# Patient Record
Sex: Female | Born: 1985 | Race: White | Hispanic: No | Marital: Married | Smoking: Never smoker
Health system: Southern US, Community
[De-identification: ages and names within clinical notes are randomized; demographics above are authoritative.]

## PROBLEM LIST (undated history)

## (undated) ENCOUNTER — Inpatient Hospital Stay (HOSPITAL_COMMUNITY): Payer: Self-pay

## (undated) DIAGNOSIS — D6851 Activated protein C resistance: Secondary | ICD-10-CM

## (undated) DIAGNOSIS — O2230 Deep phlebothrombosis in pregnancy, unspecified trimester: Secondary | ICD-10-CM

## (undated) DIAGNOSIS — R51 Headache: Secondary | ICD-10-CM

## (undated) DIAGNOSIS — R112 Nausea with vomiting, unspecified: Secondary | ICD-10-CM

## (undated) DIAGNOSIS — D759 Disease of blood and blood-forming organs, unspecified: Secondary | ICD-10-CM

## (undated) DIAGNOSIS — R87619 Unspecified abnormal cytological findings in specimens from cervix uteri: Secondary | ICD-10-CM

## (undated) DIAGNOSIS — E282 Polycystic ovarian syndrome: Secondary | ICD-10-CM

## (undated) DIAGNOSIS — I2699 Other pulmonary embolism without acute cor pulmonale: Secondary | ICD-10-CM

## (undated) DIAGNOSIS — I8390 Asymptomatic varicose veins of unspecified lower extremity: Secondary | ICD-10-CM

## (undated) DIAGNOSIS — B999 Unspecified infectious disease: Secondary | ICD-10-CM

## (undated) DIAGNOSIS — Z9889 Other specified postprocedural states: Secondary | ICD-10-CM

## (undated) HISTORY — DX: Asymptomatic varicose veins of unspecified lower extremity: I83.90

## (undated) HISTORY — PX: OTHER SURGICAL HISTORY: SHX169

## (undated) HISTORY — DX: Unspecified abnormal cytological findings in specimens from cervix uteri: R87.619

---
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
## 2006-09-28 ENCOUNTER — Encounter: Admission: RE | Admit: 2006-09-28 | Discharge: 2006-09-28 | Payer: Self-pay | Admitting: Obstetrics and Gynecology

## 2007-08-26 ENCOUNTER — Ambulatory Visit (HOSPITAL_COMMUNITY): Admission: RE | Admit: 2007-08-26 | Discharge: 2007-08-26 | Payer: Self-pay | Admitting: Emergency Medicine

## 2007-10-19 ENCOUNTER — Emergency Department (HOSPITAL_COMMUNITY): Admission: EM | Admit: 2007-10-19 | Discharge: 2007-10-19 | Payer: Self-pay | Admitting: Family Medicine

## 2008-01-10 ENCOUNTER — Emergency Department (HOSPITAL_COMMUNITY): Admission: EM | Admit: 2008-01-10 | Discharge: 2008-01-10 | Payer: Self-pay | Admitting: Emergency Medicine

## 2008-03-01 ENCOUNTER — Emergency Department (HOSPITAL_COMMUNITY): Admission: EM | Admit: 2008-03-01 | Discharge: 2008-03-01 | Payer: Self-pay | Admitting: Family Medicine

## 2010-01-28 ENCOUNTER — Emergency Department (HOSPITAL_COMMUNITY): Admission: EM | Admit: 2010-01-28 | Discharge: 2010-01-28 | Payer: Self-pay | Admitting: Emergency Medicine

## 2010-01-28 ENCOUNTER — Emergency Department (HOSPITAL_COMMUNITY): Admission: EM | Admit: 2010-01-28 | Discharge: 2010-01-28 | Payer: Self-pay | Admitting: Family Medicine

## 2010-10-23 LAB — POCT URINALYSIS DIP (DEVICE)
Bilirubin Urine: NEGATIVE
Protein, ur: NEGATIVE mg/dL
pH: 7 (ref 5.0–8.0)

## 2010-10-23 LAB — COMPREHENSIVE METABOLIC PANEL
ALT: 20 U/L (ref 0–35)
AST: 18 U/L (ref 0–37)
Albumin: 3.9 g/dL (ref 3.5–5.2)
Alkaline Phosphatase: 101 U/L (ref 39–117)
BUN: 4 mg/dL — ABNORMAL LOW (ref 6–23)
Potassium: 3.9 mEq/L (ref 3.5–5.1)
Total Bilirubin: 0.8 mg/dL (ref 0.3–1.2)

## 2010-10-23 LAB — DIFFERENTIAL
Eosinophils Absolute: 0.1 10*3/uL (ref 0.0–0.7)
Eosinophils Relative: 1 % (ref 0–5)
Lymphocytes Relative: 29 % (ref 12–46)
Lymphs Abs: 1.3 10*3/uL (ref 0.7–4.0)
Monocytes Absolute: 0.6 10*3/uL (ref 0.1–1.0)
Neutro Abs: 2.6 10*3/uL (ref 1.7–7.7)
Neutrophils Relative %: 56 % (ref 43–77)

## 2010-10-23 LAB — POCT PREGNANCY, URINE: Preg Test, Ur: NEGATIVE

## 2010-10-23 LAB — CBC
MCH: 27 pg (ref 26.0–34.0)
WBC: 4.7 10*3/uL (ref 4.0–10.5)

## 2010-10-23 LAB — GC/CHLAMYDIA PROBE AMP, GENITAL: Chlamydia, DNA Probe: NEGATIVE

## 2010-10-23 LAB — WET PREP, GENITAL
Clue Cells Wet Prep HPF POC: NONE SEEN
Trich, Wet Prep: NONE SEEN
Yeast Wet Prep HPF POC: NONE SEEN

## 2011-05-01 LAB — POCT URINALYSIS DIP (DEVICE)
Bilirubin Urine: NEGATIVE
Protein, ur: NEGATIVE
Urobilinogen, UA: 0.2
pH: 7

## 2011-05-04 LAB — POCT URINALYSIS DIP (DEVICE)
Glucose, UA: NEGATIVE
Nitrite: POSITIVE — AB
Operator id: 247071
Specific Gravity, Urine: 1.02

## 2011-05-04 LAB — URINE CULTURE

## 2011-05-04 LAB — POCT PREGNANCY, URINE
Operator id: 247071
Preg Test, Ur: NEGATIVE

## 2011-05-05 LAB — POCT URINALYSIS DIP (DEVICE)
Ketones, ur: NEGATIVE
Protein, ur: NEGATIVE
Urobilinogen, UA: 0.2
pH: 5.5

## 2011-05-05 LAB — POCT PREGNANCY, URINE
Operator id: 282151
Preg Test, Ur: NEGATIVE

## 2011-06-08 DIAGNOSIS — I2699 Other pulmonary embolism without acute cor pulmonale: Secondary | ICD-10-CM

## 2011-06-08 HISTORY — DX: Other pulmonary embolism without acute cor pulmonale: I26.99

## 2011-11-06 ENCOUNTER — Other Ambulatory Visit: Payer: Self-pay

## 2012-04-23 ENCOUNTER — Encounter (HOSPITAL_COMMUNITY): Payer: Self-pay

## 2012-04-26 ENCOUNTER — Other Ambulatory Visit (HOSPITAL_COMMUNITY): Payer: Self-pay | Admitting: Obstetrics and Gynecology

## 2012-04-26 ENCOUNTER — Ambulatory Visit (HOSPITAL_COMMUNITY)
Admission: RE | Admit: 2012-04-26 | Discharge: 2012-04-26 | Disposition: A | Discharge status: Home / Self Care | Payer: BC Managed Care – PPO | Source: Ambulatory Visit | Attending: Gynecology | Admitting: Gynecology

## 2012-04-26 ENCOUNTER — Encounter (HOSPITAL_COMMUNITY): Payer: Self-pay

## 2012-04-26 DIAGNOSIS — D6851 Activated protein C resistance: Secondary | ICD-10-CM

## 2012-04-26 DIAGNOSIS — N970 Female infertility associated with anovulation: Secondary | ICD-10-CM

## 2012-04-26 DIAGNOSIS — E282 Polycystic ovarian syndrome: Secondary | ICD-10-CM

## 2012-04-26 DIAGNOSIS — D6859 Other primary thrombophilia: Secondary | ICD-10-CM

## 2012-04-26 HISTORY — DX: Polycystic ovarian syndrome: E28.2

## 2012-04-26 HISTORY — DX: Other pulmonary embolism without acute cor pulmonale: I26.99

## 2012-04-26 MED ORDER — FOLIC ACID 1 MG PO TABS: 4.0000 mg | ORAL_TABLET | Freq: Every day | ORAL | Status: DC

## 2012-04-26 MED ORDER — COMPLETENATE 29-1 MG PO CHEW: 1.0000 | CHEWABLE_TABLET | Freq: Every day | ORAL | Status: DC

## 2012-04-26 NOTE — Progress Notes (Signed)
MFM Consultation  Reason for Consult:  Preconception counseling regarding maternal history of VTE Date of service:  04/26/2012 Requesting Physician:  Janifer Adie  Present History: Mrs. Robin Key is a 26 yo G0 with a history of a pulmonary embolism while on birth control pills in November of 2012.  She has been tested for heritable causes of thrombophilia and noted to have Factor V Leiden Heterozygosity and a borderline low protein C deficiency.  She reports having unprotected intercourse several times weekly with her husband over the past 5 years without conception.  She has been seen by [facility] for fertility evaluation and management, reporting that she has had Clomid and injectable ovulation induction agents.  IUI, IVF, and ICSI have not been desired by her to this point.  She additionally reports that her husband has oligospermia.  She presents for MFM consultation regarding my recommendations for management of a successfully conceived pregnancy.   Past Medical History  Diagnosis Date  . Pulmonary embolism Nov 2012  . PCOS (polycystic ovarian syndrome)    No past surgical history on file.  No current outpatient prescriptions on file prior to encounter.   Allergies not on file History   Social History  . Marital Status: Married    Spouse Name: N/A    Number of Children: N/A  . Years of Education: N/A   Occupational History  . Not on file.   Social History Main Topics  . Smoking status: Not on file  . Smokeless tobacco: Not on file  . Alcohol Use: Not on file  . Drug Use: Not on file  . Sexually Active: Not on file   Other Topics Concern  . Not on file   Social History Narrative  . No narrative on file   The patient has been married for 5 years and works as Lexicographer for pediatric patients.  Her husband is a Corporate investment banker and according to her, he has not been evaluated for possible varicocele.  Objective: There were no vitals filed for this  visit. Gen:  She is an overweight, Caucasian female without apparent hirsuitism More detailed physical examination was deferred as the purpose of this visit is consultative in nature, regarding the management of a prospective pregnancy.   Discussion: I talked to your patient for more than 45 minutes about her thrombosis history (pulmonary embolism in November of 2012) in context of a Factor V Leiden heterozygosity.  I explained that patients with a history of deep venous thrombosis during a prior pregnancy or while on birth control pills have an increased risk for recurrence during their pregnancies, generally estimated to be in the range of about 5%.  On the other hand, patients with an inciting event to explain their DVT (such as a fracture or immobilization) are generally not thought to be at increased risk for recurrence during their pregnancy, so long as there are no other abnormalities or risk factors.   I explained the association between the inherited thrombophilias and risk of thrombosis, especially during pregnancy. Moreover, I told your patient that most practitioners would recommend that she take either unfractionated heparin or low molecular weight heparin prophylaxis for the duration of her pregnancy and for six weeks postpartum.  I reviewed in detail the use of prophylactic subcutaneous heparin.  I explained that prophylactic heparinization is based on empiric dosing. However, recent data supports monitoring an Anti-Xa level monthly, ensuring that the value be between 0.3 and 0.5 to achieve adequate thrombophylaxis with adjustments  for values outside of this range.  A number of dosing protocols have been used, and I suggested that she use Lovenox 40 units subq twice daily, for the duration of her pregnancy and 6 weeks of the postpartum period.  As you well know, LMWH has the advantages of decreased need for coagulation monitoring, decreased risk of bone loss, and lower risk of  thrombocytopenia, these advantages are less impressive for patients on prophylactic anticoagulation.   At term, she should have her LMWH withheld for at least 24 hours before delivery.  This can usually be accomplished by simply having a patient hold her dose if she has any symptoms suggestive of early labor.  If labor does not follow, she can simply re-start her LMWH, continuing until she again senses regular contractions.  Alternatively, a scheduled induction of labor could be considered after 39 weeks holding the dose 24 hours prior to admission for either ripening or induction.  Of course, I would recommend sequential compression devices during the induction and certainly while immobilized should she elect to have placement of an epidural.  I also spoke to her about initiating prenatal vitamins and supplemental folic acid for the prevention of neural tube defects.  Given her Caucasian background and history of infertility, I recommended to her taking both a Prenatal vitamin and 4 mg of folic acid daily for ideally 90 days prior to conception.  I advised her to consider referral to an infertility specialist as ART may be her best option to achieve a successful pregnancy.  At this point, she did not request a referral to REI and will await a discussion with you regarding your recommendations.  Summary of Recommendations:  1. Consideration for referral to an infertility specialist.  The group at [facility] would be happy to entertain a referral should you request one from their office. 2. Initiate prenatal vitamin and folic acid 4mg  oral tablets daily.  As a courtesy, these were ordered for your patient prior to her departure today and sent to her [employer]. 3.  Upon achievement of a positive pregnancy test, I would recommend an early dating scan at around 6 weeks to confirm viability. 4.  I recommend initiating LMWH (Lovenox) 40mg  subcutaneously every 12 hours upon confirmation of a  viable pregnancy in the first trimester (ie, to follow the dating scan). 5.  I would check Anti-Xa levels monthly, beginning 4 hours after the 4th dose.  I would adjust dosing to achieve adequate thromboprophylaxis of 0.3-0.5 for the Anti-Xa. 6.  Of course, I recommend First Trimester Screening at around 11-[redacted] weeks gestational age, second trimester AFP at 15-20 weeks, and anatomic survey at around 27-[redacted] weeks gestational age. 7.  Due to maternal body habitus and history of PCOS, I would recommend screening for GDM with a 1 hour glucola early at around 15-20 weeks and if negative, repeating this screen at around 24-[redacted] weeks gestational age. 8.  Should you desire involvement and management of pregnancy by a Maternal-Fetal Medicine specialist, our practice would be happy to co-manage or manage prenatal visits as you so desire. 9.  If not previously done, I would recommend screening with an antiphospholipid antibody syndrome (APS) panel (anticardiolipin, lupus anticoagulant, and anti-beta2 glycoprotein I) and would consider repeating the Protein C levels that were "previously borderline" per verbal report of the patient.   Thank you for consultation.  It was a pleasure seeing your patient in the office today.  I spent 45 minutes in face-to-face time counseling your  patient today with more than 50% of this time in education and counseling.   Rogelia Boga, MD, MS, FACOG Assistant Professor Section of Maternal-Fetal Medicine [facility]

## 2012-11-27 ENCOUNTER — Inpatient Hospital Stay (HOSPITAL_COMMUNITY): Payer: BC Managed Care – PPO

## 2012-11-27 ENCOUNTER — Inpatient Hospital Stay (HOSPITAL_COMMUNITY)
Admission: AD | Admit: 2012-11-27 | Discharge: 2012-11-27 | Disposition: A | Discharge status: Home / Self Care | Payer: BC Managed Care – PPO | Source: Ambulatory Visit | Attending: Obstetrics and Gynecology | Admitting: Obstetrics and Gynecology

## 2012-11-27 ENCOUNTER — Encounter (HOSPITAL_COMMUNITY): Payer: Self-pay | Admitting: *Deleted

## 2012-11-27 DIAGNOSIS — R109 Unspecified abdominal pain: Secondary | ICD-10-CM | POA: Insufficient documentation

## 2012-11-27 DIAGNOSIS — O209 Hemorrhage in early pregnancy, unspecified: Secondary | ICD-10-CM | POA: Insufficient documentation

## 2012-11-27 DIAGNOSIS — O368990 Maternal care for other specified fetal problems, unspecified trimester, not applicable or unspecified: Secondary | ICD-10-CM

## 2012-11-27 DIAGNOSIS — O418X10 Other specified disorders of amniotic fluid and membranes, first trimester, not applicable or unspecified: Secondary | ICD-10-CM

## 2012-11-27 NOTE — MAU Note (Signed)
PT SAYS  YESTERDAY - SHE HAD BROWN SPOTTING.Marland Kitchen  HAS BEEN HAVING CRAMPING - BRURNING IN LOWER ABD  SINCE YESTERDAY.  CALLED  DR MEZER'S OFFICE - TOLD TO COME HER.   SHE GOES TO DR Chevis Pretty  FOR INFERTILITY.-  DID NOT  EVER TAKE INFERTILITY  MEDS.      - HAS NOT CHOSEN AN OB DR.

## 2012-11-27 NOTE — MAU Provider Note (Signed)
History     CSN: [id number]  Arrival date and time: 11/27/12 0418   None     No chief complaint on file.  HPI This is a 27 y.o. female at [redacted] weeks gestation who presents with c/o abdominal pain from umbilicus to pelvis (sharp shooting) today and also brown discharge. Has been seeing Dr Chevis Pretty for early pregnancy.  Has had good doubling of HCG levels and one US showed FHR 130.  States he saw "2 ovarian cysts" and "fluid behind the uterus" that "he was afraid was bleeding and he might have to fix it surgically".  States answering service told her to come here. Is on Lovenox for hx pulmonary embolism on OCPs.  OB History   Grav Para Term Preterm Abortions TAB SAB Ect Mult Living   1 0 0 0 0 0 0 0 0 0       Past Medical History  Diagnosis Date  . Pulmonary embolism Nov 2012  . PCOS (polycystic ovarian syndrome)     Past Surgical History  Procedure Laterality Date  . Bladder stem stretched      History reviewed. No pertinent family history.  History  Substance Use Topics  . Smoking status: Never Smoker   . Smokeless tobacco: Not on file  . Alcohol Use: No    Allergies:  Allergies  Allergen Reactions  . Bactrim (Sulfamethoxazole W-Trimethoprim) Other (See Comments)    Headache   . Ciprofloxacin Other (See Comments)    Felt like "bugs crawling"  . Latex Rash  . Penicillins Rash    Prescriptions prior to admission  Medication Sig Dispense Refill  . folic acid (FOLVITE) 1 MG tablet Take 4 tablets (4 mg total) by mouth daily.  100 tablet  10  . prenatal vitamin w/FE, FA (NATACHEW) 29-1 MG CHEW Chew 1 tablet by mouth daily.  30 tablet  10    Review of Systems  Constitutional: Negative for fever, chills and malaise/fatigue.  Gastrointestinal: Positive for nausea and abdominal pain. Negative for vomiting, diarrhea and constipation.  Genitourinary: Negative for dysuria.  Neurological: Negative for dizziness, weakness and headaches.  Psychiatric/Behavioral: The patient  is nervous/anxious.    Physical Exam   Blood pressure 116/75, pulse 90, temperature 98.1 F (36.7 C), temperature source Oral, resp. rate 18, height 5\' 7"  (1.702 m), weight 226 lb (102.513 kg).  Physical Exam  Constitutional: She is oriented to person, place, and time. She appears well-developed and well-nourished. No distress.  Cardiovascular: Normal rate.   Respiratory: Effort normal.  GI: Soft. She exhibits no distension and no mass. There is no tenderness. There is no rebound and no guarding.  Light brown discharge (small amt) in vault Cervix long and closed   Genitourinary: Uterus normal. Vaginal discharge found.  Musculoskeletal: Normal range of motion.  Neurological: She is alert and oriented to person, place, and time.  Skin: Skin is warm and dry.  Psychiatric: She has a normal mood and affect.    MAU Course  Procedures  MDM Will check and [place] to make sure there is no internal bleeding  US Ob Transvaginal  11/27/2012  *RADIOLOGY REPORT*  Clinical Data: Spotting, pregnant.  OBSTETRIC <14 WK [place] AND TRANSVAGINAL OB US  Technique:  Both transabdominal and transvaginal ultrasound examinations were performed for complete evaluation of the gestation as well as the maternal uterus, adnexal regions, and pelvic cul-de-sac.  Transvaginal technique was performed to assess early pregnancy.  Comparison:  None.  Intrauterine gestational sac:  Visualized/normal in shape. Yolk  sac: Identified Embryo: Identified Cardiac Activity: Identified Heart Rate: 146 bpm  CRL: 10  mm  7 w  1 d         [place] EDC: 07/15/2013  Maternal uterus/adnexae: Small subchorionic hemorrhage.  Normal sonographic appearance to the ovaries.  Trace free fluid.   MPRESSION: Single intrauterine gestation with cardiac activity documented. Estimated age of 7 weeks 1 day by crown-rump length.  Small subchorionic hemorrhage.   Original Report Authenticated By: Jearld Lesch, M.D.     Assessment and Plan  A:  [facility] at [facility]  P:  Discharge home      Bleeding precautions      Followup with Dr Rema Fendt 11/27/2012, 5:15 AM

## 2012-12-10 LAB — OB RESULTS CONSOLE ABO/RH: RH Type: POSITIVE

## 2012-12-10 LAB — OB RESULTS CONSOLE ANTIBODY SCREEN: Antibody Screen: NEGATIVE

## 2012-12-10 LAB — OB RESULTS CONSOLE RUBELLA ANTIBODY, IGM: Rubella: IMMUNE

## 2012-12-10 LAB — OB RESULTS CONSOLE HEPATITIS B SURFACE ANTIGEN: Hepatitis B Surface Ag: NEGATIVE

## 2012-12-24 ENCOUNTER — Telehealth: Payer: Self-pay | Admitting: Oncology

## 2012-12-24 NOTE — Telephone Encounter (Signed)
S/W PT IN RE NP APPT 06/20 @ 10:30 W/DR. SHADAD REFERRING DR. Fayrene Fearing TOMBLIN DX- PREGNANT; [facility] [employer] WELCOME PACKET MAILED.

## 2012-12-24 NOTE — Telephone Encounter (Signed)
C/D 12/24/12 for appt. 01/24/13

## 2013-01-16 ENCOUNTER — Encounter (HOSPITAL_COMMUNITY): Payer: Self-pay | Admitting: *Deleted

## 2013-01-16 ENCOUNTER — Inpatient Hospital Stay (HOSPITAL_COMMUNITY)
Admission: AD | Admit: 2013-01-16 | Discharge: 2013-01-16 | Disposition: A | Discharge status: Home / Self Care | Payer: BC Managed Care – PPO | Source: Ambulatory Visit | Attending: Obstetrics and Gynecology | Admitting: Obstetrics and Gynecology

## 2013-01-16 DIAGNOSIS — O210 Mild hyperemesis gravidarum: Secondary | ICD-10-CM | POA: Insufficient documentation

## 2013-01-16 DIAGNOSIS — O219 Vomiting of pregnancy, unspecified: Secondary | ICD-10-CM

## 2013-01-16 HISTORY — DX: Headache: R51

## 2013-01-16 HISTORY — DX: Other specified postprocedural states: Z98.890

## 2013-01-16 HISTORY — DX: Other specified postprocedural states: R11.2

## 2013-01-16 HISTORY — DX: Activated protein C resistance: D68.51

## 2013-01-16 HISTORY — DX: Unspecified infectious disease: B99.9

## 2013-01-16 LAB — URINALYSIS, ROUTINE W REFLEX MICROSCOPIC
Glucose, UA: NEGATIVE mg/dL
Leukocytes, UA: NEGATIVE
Nitrite: NEGATIVE
pH: 7 (ref 5.0–8.0)

## 2013-01-16 MED ORDER — ONDANSETRON HCL 4 MG/2ML IJ SOLN
4.0000 mg | Freq: Once | INTRAMUSCULAR | Status: AC
  Administered 2013-01-16: 4 mg via INTRAVENOUS
  Filled 2013-01-16: qty 2

## 2013-01-16 MED ORDER — DOXYLAMINE-PYRIDOXINE 10-10 MG PO TBEC: 20.0000 mg | DELAYED_RELEASE_TABLET | Freq: Every day | ORAL | Status: DC

## 2013-01-16 MED ORDER — LACTATED RINGERS IV BOLUS (SEPSIS)
1000.0000 mL | Freq: Once | INTRAVENOUS | Status: AC
  Administered 2013-01-16: 1000 mL via INTRAVENOUS

## 2013-01-16 NOTE — MAU Note (Signed)
Called office, sent in for fluids.  Peed once today, did not pee at all last night.

## 2013-01-16 NOTE — MAU Provider Note (Signed)
History     CSN: [id number]  Arrival date and time: 01/16/13 1329   First Provider Initiated Contact with Patient 01/16/13 1400      Chief Complaint  Patient presents with  . Dehydration  . Headache   HPI [name] is a 27 y.o. G1P0000 at [redacted]w[redacted]d who presents to MAU today with complaint of N/V, dehydration and headache. The patient states that she has had frequent N/V throughout the pregnancy. She has been on Zofran and Diclegis and feels neither is working anymore. She states mild lower abdominal cramping and decreased urine output since last night. She denies vaginal bleeding, abnormal discharge, LOF, fever or UTI symptoms. She also states that she has had increased fatigue recently.   OB History   Grav Para Term Preterm Abortions TAB SAB Ect Mult Living   1 0 0 0 0 0 0 0 0 0       Past Medical History  Diagnosis Date  . Pulmonary embolism Nov 2012  . PCOS (polycystic ovarian syndrome)   . PONV (postoperative nausea and vomiting)   . Headache(784.0)   . Factor V Leiden   . Infection     UTI    Past Surgical History  Procedure Laterality Date  . Bladder stem stretched      Family History  Problem Relation Age of Onset  . Cancer Mother     skin  . Heart disease Father   . Miscarriages / Stillbirths Sister   . Diabetes Maternal Grandmother   . Cancer Maternal Grandmother     breast  . Osteoporosis Maternal Grandmother   . Glaucoma Maternal Grandmother   . Vision loss Maternal Grandmother   . Heart disease Maternal Grandfather   . Hypertension Maternal Grandfather   . Stroke Maternal Grandfather   . Dementia Paternal Grandmother   . Heart disease Paternal Grandfather     History  Substance Use Topics  . Smoking status: Never Smoker   . Smokeless tobacco: Never Used  . Alcohol Use: No    Allergies:  Allergies  Allergen Reactions  . Bactrim (Sulfamethoxazole W-Trimethoprim) Other (See Comments)    Headache   . Ciprofloxacin Other (See  Comments)    Felt like "bugs crawling"  . Latex Rash  . Penicillins Rash    Prescriptions prior to admission  Medication Sig Dispense Refill  . acetaminophen (TYLENOL) 500 MG tablet Take 1,000 mg by mouth every 6 (six) hours as needed for pain.      . butalbital-acetaminophen-caffeine (FIORICET, ESGIC) 50-325-40 MG per tablet Take 1 tablet by mouth 2 (two) times daily as needed for headache.      . enoxaparin (LOVENOX) 150 MG/ML injection Inject 150 mg into the skin daily.      . Ondansetron (ZOFRAN ODT PO) Take 1 tablet by mouth every 8 (eight) hours as needed (nausea).      . prenatal vitamin w/FE, FA (NATACHEW) 29-1 MG CHEW Chew 1 tablet by mouth daily.  30 tablet  10    Review of Systems  Constitutional: Positive for malaise/fatigue. Negative for fever.  Gastrointestinal: Positive for nausea, vomiting and abdominal pain. Negative for diarrhea and constipation.  Genitourinary: Negative for dysuria, urgency and frequency.       Neg - vaginal bleeding, discharge, LOF  Neurological: Positive for weakness and headaches. Negative for loss of consciousness.   Physical Exam   Blood pressure 114/72, pulse 112, temperature 99.2 F (37.3 C), temperature source Oral, resp. rate 20, height 5\' 6"  (1.676  m), weight 224 lb (101.606 kg).  Physical Exam  Constitutional: She is oriented to person, place, and time. She appears well-developed and well-nourished. No distress.  HENT:  Head: Normocephalic and atraumatic.  Cardiovascular: Normal rate.   Respiratory: Effort normal.  GI: Soft.  Neurological: She is alert and oriented to person, place, and time.  Skin: Skin is warm and dry. No erythema.  Psychiatric: She has a normal mood and affect.   Results for orders placed during the hospital encounter of 01/16/13 (from the past 24 hour(s))  URINALYSIS, ROUTINE W REFLEX MICROSCOPIC     Status: Abnormal   Collection Time    01/16/13  2:00 PM      Result Value Range   Color, Urine YELLOW   YELLOW   APPearance CLEAR  CLEAR   Specific Gravity, Urine 1.025  1.005 - 1.030   pH 7.0  5.0 - 8.0   Glucose, UA NEGATIVE  NEGATIVE mg/dL   Hgb urine dipstick NEGATIVE  NEGATIVE   Bilirubin Urine NEGATIVE  NEGATIVE   Ketones, ur 15 (*) NEGATIVE mg/dL   Protein, ur NEGATIVE  NEGATIVE mg/dL   Urobilinogen, UA 0.2  0.0 - 1.0 mg/dL   Nitrite NEGATIVE  NEGATIVE   Leukocytes, UA NEGATIVE  NEGATIVE    MAU Course  Procedures None  MDM Discussed with Dr. Renaldo Fiddler. 1 L LR bolus with Zofran or Phenergan. PO challenge.  Updated Dr. Renaldo Fiddler. Ok for discharge with Rx for Diclegis 2 tabs qhs and add 1 tab qam if symptoms persists. Prescribe #90.   Assessment and Plan  A: Nausea and vomiting in pregnancy prior to [redacted] weeks gestation  P: Discharge home Rx for Diclegis sent to patient's pharmacy Patient encouraged to increase PO hydration as tolerated Patient encouraged to keep scheduled follow-up with [facility] as scheduled for next week or call for sooner if symptoms persist or worsen Patient may return to MAU as needed  Freddi Starr, PA-C  01/16/2013, 4:29 PM

## 2013-01-24 ENCOUNTER — Ambulatory Visit (HOSPITAL_BASED_OUTPATIENT_CLINIC_OR_DEPARTMENT_OTHER): Payer: BC Managed Care – PPO | Admitting: Oncology

## 2013-01-24 ENCOUNTER — Other Ambulatory Visit: Payer: BC Managed Care – PPO | Admitting: Lab

## 2013-01-24 ENCOUNTER — Ambulatory Visit: Payer: BC Managed Care – PPO

## 2013-01-24 VITALS — BP 109/68 | HR 76 | Temp 98.4°F | Resp 18 | Ht 66.0 in | Wt 225.5 lb

## 2013-01-24 DIAGNOSIS — Z86718 Personal history of other venous thrombosis and embolism: Secondary | ICD-10-CM

## 2013-01-24 NOTE — Progress Notes (Signed)
Reason for Referral: Thrombosis and pregnancy.   HPI: Robin Key is a pleasant 27 year old woman currently living in a [place] where she lived the majority of her life. 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 She is a rather healthy woman who was in her usual state of health where she presented in November 2012 with symptoms of chest pain and shortness of breath. She was evaluated in the emergency department and found to have a right sided pulmonary embolus. She was initially treated with Lovenox and subsequently transition to warfarin over her INR levels were fluctuating and was switched Xarelto  about 6 months. She was checked for inherited thrombophilia and was found to have a heterozygous factor V Leiden mutation. During that the first episode of pulmonary embolus she was on birth control pills which since have discontinued. Most recently she was found to be pregnant and currently she is close to 16 weeks of gestation. When when her pregnancy was established, she was started on low molecular weight heparin in the form of Lovenox dural 150 mg subcutaneously daily. She was evaluated by Dr. Melvyn Neth at [facility]. Patient had been doing well with Lovenox without any major complications although intermittently he have reported some shoulder pain and some minor chest discomfort. She was not able to be evaluated for recurrent pulmonary embolus due to her pregnancy state and no imaging studies were done. She was referred to me for the evaluation and recommendation regarding her treatment of pulmonary embolus in the setting of pregnancy and factor V Leiden.  Clinically, he is a relatively well with her pregnancy. She is not reporting any bleeding complications. She is not reporting any thrombosis. She had the superficial vein bulging but no phlebitis. She does have intermittent shoulder pain but really no shortness of  breath or pleurisy at this time. She had not reported any neurological symptoms but reports some minor nausea associated with her pregnancy.   Past Medical History  Diagnosis Date  . Pulmonary embolism Nov 2012  . PCOS (polycystic ovarian syndrome)   . PONV (postoperative nausea and vomiting)   . Headache(784.0)   . Factor V Leiden   . Infection     UTI  :   Current Outpatient Prescriptions  Medication Sig Dispense Refill  . acetaminophen (TYLENOL) 500 MG tablet Take 1,000 mg by mouth every 6 (six) hours as needed for pain.      . butalbital-acetaminophen-caffeine (FIORICET, ESGIC) 50-325-40 MG per tablet Take 1 tablet by mouth 2 (two) times daily as needed for headache.      . Doxylamine-Pyridoxine (DICLEGIS) 10-10 MG TBEC Take 20 mg by mouth at bedtime.  90 tablet  0  . enoxaparin (LOVENOX) 150 MG/ML injection Inject 150 mg into the skin daily.      . Ondansetron (ZOFRAN ODT PO) Take 1 tablet by mouth every 8 (eight) hours as needed (nausea).      Marland Kitchen UNABLE TO FIND Take 1 tablet by mouth daily. Med Name: citronate beta       No current facility-administered medications for this visit.    Past Surgical History  Procedure Laterality Date  . Bladder stem stretched    :    Allergen Reactions  . Bactrim (Sulfamethoxazole W-Trimethoprim) Other (See Comments)    Headache   . Ciprofloxacin Other (See Comments)    Felt like "bugs crawling"  . Latex Rash  .  Penicillins Rash  :  Family History  Problem Relation Age of Onset  . Cancer Mother     skin  . Heart disease Father   . Miscarriages / Stillbirths Sister   . Diabetes Maternal Grandmother   . Cancer Maternal Grandmother     breast  . Osteoporosis Maternal Grandmother   . Glaucoma Maternal Grandmother   . Vision loss Maternal Grandmother   . Heart disease Maternal Grandfather   . Hypertension Maternal Grandfather   . Stroke Maternal Grandfather   . Dementia Paternal Grandmother   . Heart disease Paternal  Grandfather   :  History   Social History  . Marital Status: Married    Spouse Name: N/A    Number of Children: N/A  . Years of Education: N/A   Occupational History  . Not on file.   Social History Main Topics  . Smoking status: Never Smoker   . Smokeless tobacco: Never Used  . Alcohol Use: No  . Drug Use: No  . Sexually Active: Yes   Other Topics Concern  . Not on file   Social History Narrative  . No narrative on file  :  A comprehensive review of systems was negative.  Exam: ECOG 0 Blood pressure 109/68, pulse 76, temperature 98.4 F (36.9 C), temperature source Oral, resp. rate 18, height 5\' 6"  (1.676 m), weight 225 lb 8 oz (102.286 kg). General appearance: alert, cooperative and appears stated age Head: Normocephalic, without obvious abnormality, atraumatic Neck: no adenopathy, no carotid bruit, no JVD, supple, symmetrical, trachea midline and thyroid not enlarged, symmetric, no tenderness/mass/nodules Back: symmetric, no curvature. ROM normal. No CVA tenderness. Resp: clear to auscultation bilaterally Chest wall: no tenderness Cardio: regular rate and rhythm, S1, S2 normal, no murmur, click, rub or gallop GI: soft, non-tender; bowel sounds normal; no masses,  no organomegaly Extremities: extremities normal, atraumatic, no cyanosis or edema Skin: Skin color, texture, turgor normal. No rashes or lesions Lymph nodes: Cervical, supraclavicular, and axillary nodes normal.   Assessment and Plan:   27 year old woman with the following issues:  1. History of thrombophilia presented with pulmonary embolus in November of 2012 in the setting of heterozygous factor V Leiden mutation while on all contraceptives. She was treated with full dose and the coagulation initially with Lovenox and subsequently with Xarelto. She is currently seeing weeks of gestation and have been prescribed full dose Lovenox 150 mg daily. She reports no complications at this time from her pregnancy  or Lovenox treatment. I discussed the natural history of inherited thrombophilia and the risk of recurrent thrombosis associated with pregnancy and I have discussed with her the guidelines and recommendation regarding venous thromboembolism prophylaxis. At this time, despite her history she still have relatively low-risk of recurrent thrombosis but certainly warrants pharmacological prophylaxis at this time.  I do agree with the use of Lovenox at this time I would probably would suggest prophylaxis doses unless she is potentially developing recurrent pulmonary emboli. She did have symptoms of chest pain and shoulder pain and it was difficult to assess given her pregnancy and for that reason therapeutic doses of Lovenox appears reasonable at this time.  In a situation like this, we would recommend prophylaxis with Lovenox also at the pregnancy and 4 weeks post partum.  We will also recommend checking his CBC periodically as a part of her routine OB checks. Her most recent CBC on 12/12/2012 [facility] she had normal platelets and a hemoglobin of 12.8  In terms of pregnancy  protection, there is a risk of miscarriage is associated with inherited thrombophilia and certainly prophylaxis with Lovenox would help without and seems to have helped her with her pregnancy so far.  2. Followup: At this time routine followup here with [place] at the [facility] is not necessary but will be happy to see her in the future as needed if any complications arise

## 2013-05-12 ENCOUNTER — Encounter (HOSPITAL_COMMUNITY): Payer: Self-pay | Admitting: *Deleted

## 2013-05-12 ENCOUNTER — Inpatient Hospital Stay (HOSPITAL_COMMUNITY)
Admission: AD | Admit: 2013-05-12 | Discharge: 2013-05-13 | Disposition: A | Discharge status: Home / Self Care | Payer: BC Managed Care – PPO | Source: Ambulatory Visit | Attending: Obstetrics and Gynecology | Admitting: Obstetrics and Gynecology

## 2013-05-12 DIAGNOSIS — H53459 Other localized visual field defect, unspecified eye: Secondary | ICD-10-CM | POA: Insufficient documentation

## 2013-05-12 DIAGNOSIS — M25511 Pain in right shoulder: Secondary | ICD-10-CM

## 2013-05-12 DIAGNOSIS — O163 Unspecified maternal hypertension, third trimester: Secondary | ICD-10-CM

## 2013-05-12 DIAGNOSIS — O368190 Decreased fetal movements, unspecified trimester, not applicable or unspecified: Secondary | ICD-10-CM | POA: Insufficient documentation

## 2013-05-12 DIAGNOSIS — Z86711 Personal history of pulmonary embolism: Secondary | ICD-10-CM

## 2013-05-12 DIAGNOSIS — M25519 Pain in unspecified shoulder: Secondary | ICD-10-CM | POA: Insufficient documentation

## 2013-05-12 DIAGNOSIS — R51 Headache: Secondary | ICD-10-CM | POA: Insufficient documentation

## 2013-05-12 DIAGNOSIS — O139 Gestational [pregnancy-induced] hypertension without significant proteinuria, unspecified trimester: Secondary | ICD-10-CM | POA: Insufficient documentation

## 2013-05-12 LAB — URINALYSIS, ROUTINE W REFLEX MICROSCOPIC
Leukocytes, UA: NEGATIVE
Protein, ur: NEGATIVE mg/dL
Urobilinogen, UA: 0.2 mg/dL (ref 0.0–1.0)

## 2013-05-12 LAB — CBC
HCT: 32.5 % — ABNORMAL LOW (ref 36.0–46.0)
MCHC: 33.5 g/dL (ref 30.0–36.0)
MCV: 80.6 fL (ref 78.0–100.0)
RDW: 13.3 % (ref 11.5–15.5)

## 2013-05-12 LAB — COMPREHENSIVE METABOLIC PANEL
Albumin: 2.6 g/dL — ABNORMAL LOW (ref 3.5–5.2)
BUN: 6 mg/dL (ref 6–23)
Creatinine, Ser: 0.64 mg/dL (ref 0.50–1.10)
Potassium: 3.8 mEq/L (ref 3.5–5.1)
Total Protein: 6 g/dL (ref 6.0–8.3)

## 2013-05-12 MED ORDER — BUTALBITAL-APAP-CAFFEINE 50-325-40 MG PO TABS
2.0000 | ORAL_TABLET | Freq: Once | ORAL | Status: AC
  Administered 2013-05-13: 2 via ORAL
  Filled 2013-05-12: qty 2

## 2013-05-12 MED ORDER — ENOXAPARIN SODIUM 150 MG/ML ~~LOC~~ SOLN
150.0000 mg | Freq: Once | SUBCUTANEOUS | Status: DC
  Filled 2013-05-12: qty 1

## 2013-05-12 MED ORDER — ONDANSETRON HCL 4 MG PO TABS
4.0000 mg | ORAL_TABLET | Freq: Once | ORAL | Status: AC
  Administered 2013-05-12: 4 mg via ORAL
  Filled 2013-05-12: qty 1

## 2013-05-12 NOTE — MAU Note (Signed)
PT SAYS ON Sunday-  SHE SAW SPOTS.   THEN TODAY- SHE WOKE WITH H/A.- TOOK 650 MG TYLENOL - NO RELIEF.     TODAY CAME HOME FROM WORK- SHE FELT  NAUSEATED AND RIGHT SHOULDER WAS HURTING.   HAS HX- PE.    DENIES SOB.      BABY  IS NOT AS ACTIVE.  SAYS TOOK BP AT 9PM-  148/90- WITH CUFF AT HOME - MANUAL.

## 2013-05-12 NOTE — MAU Provider Note (Signed)
Chief Complaint: No chief complaint on file.   First Provider Initiated Contact with Patient 05/12/13 2317     SUBJECTIVE HPI: Robin Key is a 27 y.o. G1P0000 at [redacted]w[redacted]d by LMP who presents with: 1. Blood pressure 148/90 at 9 PM. 2. Scotoma yesterday. 3. Frontal headache all day today. No relief with Tylenol. 4. Right shoulder pain similar to when she was diagnosed with a pulmonary embolism. Is currently on Lovenox 150 mg subcutaneous daily for prophylaxis. 5. Decreased fetal movement. Baby active since arrival to maternity admissions.  History of headaches prior to pregnancy. Few during pregnancy. Normal blood pressure so far this pregnancy.  Past Medical History  Diagnosis Date  . Pulmonary embolism Nov 2012  . PCOS (polycystic ovarian syndrome)   . PONV (postoperative nausea and vomiting)   . Headache(784.0)   . Factor V Leiden   . Infection     UTI   OB History  Gravida Para Term Preterm AB SAB TAB Ectopic Multiple Living  1 0 0 0 0 0 0 0 0 0     # Outcome Date GA Lbr Len/2nd Weight Sex Delivery Anes PTL Lv  1 CUR              Past Surgical History  Procedure Laterality Date  . Bladder stem stretched     History   Social History  . Marital Status: Married    Spouse Name: N/A    Number of Children: N/A  . Years of Education: N/A   Occupational History  . Not on file.   Social History Main Topics  . Smoking status: Never Smoker   . Smokeless tobacco: Never Used  . Alcohol Use: No  . Drug Use: No  . Sexual Activity: Not Currently   Other Topics Concern  . Not on file   Social History Narrative  . No narrative on file   No current facility-administered medications on file prior to encounter.   Current Outpatient Prescriptions on File Prior to Encounter  Medication Sig Dispense Refill  . acetaminophen (TYLENOL) 500 MG tablet Take 1,000 mg by mouth every 6 (six) hours as needed for pain.      . butalbital-acetaminophen-caffeine (FIORICET, ESGIC)  50-325-40 MG per tablet Take 1 tablet by mouth 2 (two) times daily as needed for headache.      . Doxylamine-Pyridoxine (DICLEGIS) 10-10 MG TBEC Take 20 mg by mouth at bedtime.  90 tablet  0  . enoxaparin (LOVENOX) 150 MG/ML injection Inject 150 mg into the skin daily.      . Ondansetron (ZOFRAN ODT PO) Take 1 tablet by mouth every 8 (eight) hours as needed (nausea).      Marland Kitchen UNABLE TO FIND Take 1 tablet by mouth daily. Med Name: citronate beta       Allergies  Allergen Reactions  . Bactrim [Sulfamethoxazole-Trimethoprim] Other (See Comments)    Headache   . Ciprofloxacin Other (See Comments)    Felt like "bugs crawling"  . Latex Rash  . Penicillins Rash    ROS: Also positive for nausea and bilateral pedal edema, left slightly greater than right. Negative for fever, chills, current scotoma, epigastric pain, shortness of breath, calf pain or redness, contractions, vaginal bleeding or leaking of fluid.  OBJECTIVE Blood pressure 119/78, pulse 94, temperature 98.7 F (37.1 C), temperature source Oral, resp. rate 16, height 5\' 7"  (1.702 m), weight 111.301 kg (245 lb 6 oz). Patient Vitals for the past 24 hrs:  BP Temp Temp src Pulse Resp  Height Weight  05/13/13 0123 119/78 mmHg - - 94 - - -  05/13/13 0003 114/79 mmHg - - 105 - - -  05/13/13 0000 117/72 mmHg - - 106 16 - -  05/12/13 2320 121/79 mmHg - - 100 18 - -  05/12/13 2249 106/76 mmHg 98.7 F (37.1 C) Oral 117 20 5\' 7"  (1.702 m) 111.301 kg (245 lb 6 oz)    GENERAL: Well-developed, well-nourished female in no acute distress.  HEENT: Normocephalic HEART: Mild tachycardia. RESP: normal effort and rate. Clear to auscultation bilaterally. ABDOMEN: Soft, non-tender EXTREMITIES: Nontender, 3+ pitting edema. negative Homans sign. NEURO: Alert and oriented. Deep tendon reflexes 2+, no clonus. SPECULUM EXAM: Deferred  EFM: 140's, moderate variability, 15x15 accels, no decels. Toco: None  LAB RESULTS Results for orders placed during  the hospital encounter of 05/12/13 (from the past 24 hour(s))  PROTEIN / CREATININE RATIO, URINE     Status: None   Collection Time    05/12/13 10:51 PM      Result Value Range   Creatinine, Urine 69.80     Total Protein, Urine 7.1     PROTEIN CREATININE RATIO 0.10  0.00 - 0.15  URINALYSIS, ROUTINE W REFLEX MICROSCOPIC     Status: None   Collection Time    05/12/13 10:51 PM      Result Value Range   Color, Urine YELLOW  YELLOW   APPearance CLEAR  CLEAR   Specific Gravity, Urine 1.015  1.005 - 1.030   pH 6.5  5.0 - 8.0   Glucose, UA NEGATIVE  NEGATIVE mg/dL   Hgb urine dipstick NEGATIVE  NEGATIVE   Bilirubin Urine NEGATIVE  NEGATIVE   Ketones, ur NEGATIVE  NEGATIVE mg/dL   Protein, ur NEGATIVE  NEGATIVE mg/dL   Urobilinogen, UA 0.2  0.0 - 1.0 mg/dL   Nitrite NEGATIVE  NEGATIVE   Leukocytes, UA NEGATIVE  NEGATIVE  CBC     Status: Abnormal   Collection Time    05/12/13 11:00 PM      Result Value Range   WBC 13.5 (*) 4.0 - 10.5 K/uL   RBC 4.03  3.87 - 5.11 MIL/uL   Hemoglobin 10.9 (*) 12.0 - 15.0 g/dL   HCT 40.9 (*) 81.1 - 91.4 %   MCV 80.6  78.0 - 100.0 fL   MCH 27.0  26.0 - 34.0 pg   MCHC 33.5  30.0 - 36.0 g/dL   RDW 78.2  95.6 - 21.3 %   Platelets 261  150 - 400 K/uL  COMPREHENSIVE METABOLIC PANEL     Status: Abnormal   Collection Time    05/12/13 11:00 PM      Result Value Range   Sodium 135  135 - 145 mEq/L   Potassium 3.8  3.5 - 5.1 mEq/L   Chloride 99  96 - 112 mEq/L   CO2 23  19 - 32 mEq/L   Glucose, Bld 102 (*) 70 - 99 mg/dL   BUN 6  6 - 23 mg/dL   Creatinine, Ser 0.86  0.50 - 1.10 mg/dL   Calcium 9.3  8.4 - 57.8 mg/dL   Total Protein 6.0  6.0 - 8.3 g/dL   Albumin 2.6 (*) 3.5 - 5.2 g/dL   AST 12  0 - 37 U/L   ALT 13  0 - 35 U/L   Alkaline Phosphatase 96  39 - 117 U/L   Total Bilirubin 0.4  0.3 - 1.2 mg/dL   GFR calc non Af Amer >90  >  90 mL/min   GFR calc Af Amer >90  >90 mL/min    IMAGING No results found.  MAU COURSE 0030: Discussed patient's  concerns about elevated blood pressure, normal labs and BP and shoulder pain similar to that with PE. Order CTPA. Patient declines. Fioricet given for headache. Worried about radiation exposure to fetus. Reassured that radiation exposure is very minimal, but that the danger of missing diagnosis of PE is very high risk to mom and baby. Still declines. Is now when her shoulder pain is musculoskeletal in request muscle relaxant. Flexeril given.   Oxygen saturation 99 200% on room air with ambulation. Discussed patient's refusal of CTPA and normal oxygen saturation with ambulation with Dr. Arelia Sneddon. Strongly recommends that she have it done and reiterated low radiation exposure to fetus. Express Dr. Gaye Alken strong recommendation for CTPA 2 patient. She again refused and stated that her shoulder pain was improving with Flexeril.  ASSESSMENT 1. Hypertension complicating pregnancy, third trimester -transient   2. Shoulder pain, acute, right   3. History of pulmonary embolism    PLAN Discharge home in stable condition. PE precautions. Strongly encouraged patient to call hematologist in the morning to discuss symptoms and if additional workup or change in Lovenox dose is warranted.      Follow-up Information   Follow up with [facility] [place]. On 05/16/2013. (as scheduled)    Contact information:   [address] [place] [address] [phone]      Follow up with [facility] [place] [facility]. (As needed if symptoms worsen)    Contact information:   [address] [id number] [place] [address] [phone]      Follow up with [facility]. (As needed for severe shortness of breath or shoulder pain)    Contact information:   [address] [place] [address]        Medication List         acetaminophen 500 MG tablet  Commonly known as:  TYLENOL  Take 1,000 mg by mouth every 6 (six) hours as needed for  pain.     butalbital-acetaminophen-caffeine 50-325-40 MG per tablet  Commonly known as:  FIORICET, ESGIC  Take 1 tablet by mouth 2 (two) times daily as needed for headache.     cyclobenzaprine 10 MG tablet  Commonly known as:  FLEXERIL  Take 0.5-1 tablets (5-10 mg total) by mouth 3 (three) times daily as needed for muscle spasms.     Doxylamine-Pyridoxine 10-10 MG Tbec  Commonly known as:  DICLEGIS  Take 20 mg by mouth at bedtime.     enoxaparin 150 MG/ML injection  Commonly known as:  LOVENOX  Inject 150 mg into the skin daily.     promethazine 25 MG tablet  Commonly known as:  PHENERGAN  Take 1 tablet (25 mg total) by mouth every 6 (six) hours as needed for nausea.     UNABLE TO FIND  Take 1 tablet by mouth daily. Med Name: citronate beta     ZOFRAN ODT PO  Take 1 tablet by mouth every 8 (eight) hours as needed (nausea).        Dorathy Kinsman, CNM 05/13/2013  2:22 AM

## 2013-05-13 ENCOUNTER — Telehealth: Payer: Self-pay | Admitting: *Deleted

## 2013-05-13 DIAGNOSIS — O169 Unspecified maternal hypertension, unspecified trimester: Secondary | ICD-10-CM

## 2013-05-13 LAB — PROTEIN / CREATININE RATIO, URINE
Creatinine, Urine: 69.8 mg/dL
Total Protein, Urine: 7.1 mg/dL

## 2013-05-13 MED ORDER — CYCLOBENZAPRINE HCL 10 MG PO TABS
10.0000 mg | ORAL_TABLET | Freq: Once | ORAL | Status: AC
  Administered 2013-05-13: 10 mg via ORAL
  Filled 2013-05-13: qty 1

## 2013-05-13 MED ORDER — PROMETHAZINE HCL 25 MG PO TABS
25.0000 mg | ORAL_TABLET | Freq: Once | ORAL | Status: DC
  Filled 2013-05-13: qty 1

## 2013-05-13 MED ORDER — PROMETHAZINE HCL 25 MG PO TABS: 25.0000 mg | ORAL_TABLET | Freq: Four times a day (QID) | ORAL | Status: DC | PRN

## 2013-05-13 MED ORDER — CYCLOBENZAPRINE HCL 10 MG PO TABS: 5.0000 mg | ORAL_TABLET | Freq: Three times a day (TID) | ORAL | Status: DC | PRN

## 2013-05-13 NOTE — Telephone Encounter (Signed)
[facility] when I had a PE. They said I need to switch my lovenox dose. I'm currently on 150mg  1 injection daily. I weigh 245.6lbs, I'm [redacted]wks pregnant. Reviewed with MD Notified pt no change in lovenox dose at this time.

## 2013-06-11 ENCOUNTER — Other Ambulatory Visit: Payer: Self-pay | Admitting: Oncology

## 2013-06-11 ENCOUNTER — Other Ambulatory Visit: Payer: Self-pay | Admitting: *Deleted

## 2013-06-11 DIAGNOSIS — I8290 Acute embolism and thrombosis of unspecified vein: Secondary | ICD-10-CM

## 2013-06-11 MED ORDER — HEPARIN SODIUM (PORCINE) 10000 UNIT/ML IJ SOLN: 10000.0000 [IU] | Freq: Two times a day (BID) | INTRAMUSCULAR | Status: DC

## 2013-06-11 NOTE — Telephone Encounter (Signed)
Called in heparin script to [facility] in [place], notified patient.

## 2013-06-11 NOTE — Progress Notes (Signed)
I was contacted by patient primary OB/GYN to inform you that the patient will undergo a likely a C-section due to breech position of her baby. In anticipation of that, we have decided to switch her full dose low molecular weight heparin and to prophylactic unfractionated heparin. A planning for her to switch from low molecular weight heparin to fractionated heparin using 10,000 units subcutaneously every 12 hours starting tonight all day to delivery. After hemostasis had been established, she will go back on full dose low molecular weight heparin for at least 3-4 weeks post partum due to her previous history of PE and factor V Leiden. This was communicated to the patient directly today and she voiced her understanding of this current plan.

## 2013-06-12 ENCOUNTER — Encounter: Payer: Self-pay | Admitting: Oncology

## 2013-06-12 ENCOUNTER — Telehealth: Payer: Self-pay | Admitting: *Deleted

## 2013-06-12 DIAGNOSIS — I8290 Acute embolism and thrombosis of unspecified vein: Secondary | ICD-10-CM

## 2013-06-12 MED ORDER — HEPARIN SODIUM (PORCINE) 10000 UNIT/ML IJ SOLN: 10000.0000 [IU] | Freq: Two times a day (BID) | INTRAMUSCULAR | Status: DC

## 2013-06-12 NOTE — Telephone Encounter (Signed)
Patient called and left message.  She would like a 30 day supply of her heparin instead of a 2 day supply.  It takes the pharmacy 2 days to get the medication because they are not keeping it in stock and then she has a copay every 2 days.  Her due date is 07/11/13.   [facility] in [place] is the pharmacy.   Per Dr. Clelia Croft - fine to do 30 day supply. Called patient.  She would like this done at [facility]  Spoke with Arlys John there re: syringes.  Will order today and ready tomorrow.

## 2013-06-12 NOTE — Progress Notes (Signed)
[place], [phone], about patient not being able to get her heparin from [facility] pharmacy.  The rep states she does not understand why the patient is unable to get her med because it is not rejecting on their end; she will continue to work on the issue and get in touch with the pharmacy.

## 2013-06-13 ENCOUNTER — Inpatient Hospital Stay (HOSPITAL_COMMUNITY)
Admission: AD | Admit: 2013-06-13 | Discharge: 2013-06-14 | Disposition: A | Discharge status: Home / Self Care | Payer: BC Managed Care – PPO | Source: Ambulatory Visit | Attending: Obstetrics and Gynecology | Admitting: Obstetrics and Gynecology

## 2013-06-13 ENCOUNTER — Encounter (HOSPITAL_COMMUNITY): Payer: Self-pay | Admitting: *Deleted

## 2013-06-13 DIAGNOSIS — O321XX0 Maternal care for breech presentation, not applicable or unspecified: Secondary | ICD-10-CM | POA: Insufficient documentation

## 2013-06-13 DIAGNOSIS — R109 Unspecified abdominal pain: Secondary | ICD-10-CM | POA: Insufficient documentation

## 2013-06-13 DIAGNOSIS — O99891 Other specified diseases and conditions complicating pregnancy: Secondary | ICD-10-CM | POA: Insufficient documentation

## 2013-06-13 DIAGNOSIS — M549 Dorsalgia, unspecified: Secondary | ICD-10-CM | POA: Insufficient documentation

## 2013-06-13 LAB — URINALYSIS, ROUTINE W REFLEX MICROSCOPIC
Ketones, ur: NEGATIVE mg/dL
Leukocytes, UA: NEGATIVE
Protein, ur: NEGATIVE mg/dL
Specific Gravity, Urine: 1.025 (ref 1.005–1.030)
Urobilinogen, UA: 0.2 mg/dL (ref 0.0–1.0)

## 2013-06-13 NOTE — MAU Note (Signed)
I take Heparin for Factor V. My baby is breech. Urinating more last couple days. Having menstrual like cramps and back pain today. Have had several BMs this afternoon but no diarrhea

## 2013-06-14 ENCOUNTER — Inpatient Hospital Stay (HOSPITAL_COMMUNITY)
Admission: AD | Admit: 2013-06-14 | Discharge: 2013-06-14 | Disposition: A | Discharge status: Home / Self Care | Payer: BC Managed Care – PPO | Source: Ambulatory Visit | Attending: Obstetrics and Gynecology | Admitting: Obstetrics and Gynecology

## 2013-06-14 ENCOUNTER — Encounter (HOSPITAL_COMMUNITY): Payer: Self-pay | Admitting: *Deleted

## 2013-06-14 DIAGNOSIS — O99891 Other specified diseases and conditions complicating pregnancy: Secondary | ICD-10-CM | POA: Insufficient documentation

## 2013-06-14 DIAGNOSIS — Z86711 Personal history of pulmonary embolism: Secondary | ICD-10-CM | POA: Insufficient documentation

## 2013-06-14 DIAGNOSIS — M25519 Pain in unspecified shoulder: Secondary | ICD-10-CM | POA: Insufficient documentation

## 2013-06-14 DIAGNOSIS — D6859 Other primary thrombophilia: Secondary | ICD-10-CM | POA: Insufficient documentation

## 2013-06-14 DIAGNOSIS — M549 Dorsalgia, unspecified: Secondary | ICD-10-CM | POA: Insufficient documentation

## 2013-06-14 DIAGNOSIS — R109 Unspecified abdominal pain: Secondary | ICD-10-CM | POA: Insufficient documentation

## 2013-06-14 DIAGNOSIS — O9989 Other specified diseases and conditions complicating pregnancy, childbirth and the puerperium: Secondary | ICD-10-CM

## 2013-06-14 MED ORDER — HYDROCODONE-ACETAMINOPHEN 5-325 MG PO TABS: 2.0000 | ORAL_TABLET | ORAL | Status: DC | PRN

## 2013-06-14 MED ORDER — HYDROCODONE-ACETAMINOPHEN 5-325 MG PO TABS
2.0000 | ORAL_TABLET | Freq: Once | ORAL | Status: AC
  Administered 2013-06-14: 2 via ORAL
  Filled 2013-06-14: qty 2

## 2013-06-14 MED ORDER — OXYCODONE-ACETAMINOPHEN 5-325 MG PO TABS
2.0000 | ORAL_TABLET | Freq: Once | ORAL | Status: AC
  Administered 2013-06-14: 2 via ORAL
  Filled 2013-06-14: qty 2

## 2013-06-14 NOTE — MAU Note (Signed)
Pt G1 at 36.1wks with lower abd cramping x 2 days, low back pain that goes up to shoulder blades and in between shoulder blades that started this afternoon.  Breech presentation, hx of PE. Taking lovenox, switched to heparin this am.

## 2013-06-14 NOTE — MAU Provider Note (Signed)
History     CSN: [id number]  Arrival date and time: 06/14/13 2019   None     Chief Complaint  Patient presents with  . Abdominal Cramping  . Back Pain  . Shoulder Pain   HPI Pt is [redacted]w[redacted]d pregnant G1P0000 who presents with continued back pain that started yesterday at work but has expanded the area and now has bilateral shoulder pain.  Pt has had nausea and dry heaves today- she has been able to keep fluids down and Gatorade.  She has continued to have abdominal cramping now for 2 days, no vaginal discharge or bleeding or LOF.  Pt has hx of PE with Leiden Factor V  and has been on Lovenox but with impending to C-Section pt was switched to Heparin. Pt has been off of Lovenox 9 pm 150mg  which she took Thurs night.  Pt was here in MAU and did not take her Lovenox and started Heparin 10,000  Today at 1 pm, as prescribed by Dr. Clelia Croft.   Pt denies pain with deep breath or shortness of breath; pt denies leg pain or areas of redness or swelling.  RN note: Pt G1 at 36.1wks with lower abd cramping x 2 days, low back pain that goes up to shoulder blades and in between shoulder blades that started this afternoon. Breech presentation, hx of PE. Taking lovenox, switched to heparin this am.   Past Medical History  Diagnosis Date  . Pulmonary embolism Nov 2012  . PCOS (polycystic ovarian syndrome)   . PONV (postoperative nausea and vomiting)   . Headache(784.0)   . Factor V Leiden   . Infection     UTI    Past Surgical History  Procedure Laterality Date  . Bladder stem stretched      Family History  Problem Relation Age of Onset  . Cancer Mother     skin  . Heart disease Father   . Miscarriages / Stillbirths Sister   . Diabetes Maternal Grandmother   . Cancer Maternal Grandmother     breast  . Osteoporosis Maternal Grandmother   . Glaucoma Maternal Grandmother   . Vision loss Maternal Grandmother   . Heart disease Maternal Grandfather   . Hypertension Maternal Grandfather   .  Stroke Maternal Grandfather   . Dementia Paternal Grandmother   . Heart disease Paternal Grandfather     History  Substance Use Topics  . Smoking status: Never Smoker   . Smokeless tobacco: Never Used  . Alcohol Use: No    Allergies:  Allergies  Allergen Reactions  . Bactrim [Sulfamethoxazole-Trimethoprim] Other (See Comments)    Headache   . Ciprofloxacin Other (See Comments)    Felt like "bugs crawling"  . Latex Rash  . Penicillins Rash    Prescriptions prior to admission  Medication Sig Dispense Refill  . acetaminophen (TYLENOL) 500 MG tablet Take 1,000 mg by mouth every 6 (six) hours as needed for pain.      Marland Kitchen azithromycin (ZITHROMAX) 250 MG tablet Take by mouth daily.      . butalbital-acetaminophen-caffeine (FIORICET, ESGIC) 50-325-40 MG per tablet Take 1 tablet by mouth 2 (two) times daily as needed for headache.      . cetirizine (ZYRTEC) 10 MG tablet Take 10 mg by mouth daily.      . cyclobenzaprine (FLEXERIL) 10 MG tablet Take 0.5-1 tablets (5-10 mg total) by mouth 3 (three) times daily as needed for muscle spasms.  30 tablet  0  . Doxylamine-Pyridoxine (DICLEGIS) 10-10  MG TBEC Take 20 mg by mouth at bedtime.  90 tablet  0  . enoxaparin (LOVENOX) 150 MG/ML injection Inject 150 mg into the skin daily.      . heparin 16109 UNIT/ML injection Inject 1 mL (10,000 Units total) into the skin every 12 (twelve) hours.  60 mL  0  . Ondansetron (ZOFRAN ODT PO) Take 1 tablet by mouth every 8 (eight) hours as needed (nausea).      . promethazine (PHENERGAN) 25 MG tablet Take 1 tablet (25 mg total) by mouth every 6 (six) hours as needed for nausea.  30 tablet  0    Review of Systems  Constitutional: Negative for fever and chills.  Gastrointestinal: Positive for nausea and abdominal pain. Negative for vomiting.  Genitourinary: Negative for dysuria and urgency.  Musculoskeletal: Positive for back pain.   Physical Exam   Blood pressure 128/84, pulse 119, temperature 98.3 F  (36.8 C), temperature source Oral, resp. rate 22, height 5\' 9"  (1.753 m), weight 112.583 kg (248 lb 3.2 oz), SpO2 100.00%.  Physical Exam  Nursing note and vitals reviewed. Constitutional: She is oriented to person, place, and time. She appears well-developed and well-nourished. No distress.  HENT:  Head: Normocephalic.  Eyes: Pupils are equal, round, and reactive to light.  Cardiovascular: Normal rate and regular rhythm.   Respiratory: Effort normal and breath sounds normal. No respiratory distress. She has no wheezes. She has no rales.  GI: Soft. She exhibits no distension. There is no tenderness. There is no rebound.  Musculoskeletal: Normal range of motion.  Neurological: She is alert and oriented to person, place, and time.  Skin: Skin is warm and dry.  Psychiatric: She has a normal mood and affect.    MAU Course  Procedures Results for orders placed during the hospital encounter of 06/13/13 (from the past 24 hour(s))  URINALYSIS, ROUTINE W REFLEX MICROSCOPIC     Status: None   Collection Time    06/13/13 10:35 PM      Result Value Range   Color, Urine YELLOW  YELLOW   APPearance CLEAR  CLEAR   Specific Gravity, Urine 1.025  1.005 - 1.030   pH 6.0  5.0 - 8.0   Glucose, UA NEGATIVE  NEGATIVE mg/dL   Hgb urine dipstick NEGATIVE  NEGATIVE   Bilirubin Urine NEGATIVE  NEGATIVE   Ketones, ur NEGATIVE  NEGATIVE mg/dL   Protein, ur NEGATIVE  NEGATIVE mg/dL   Urobilinogen, UA 0.2  0.0 - 1.0 mg/dL   Nitrite NEGATIVE  NEGATIVE   Leukocytes, UA NEGATIVE  NEGATIVE  discussed with Dr. Henderson Cloud- will treat for back spasm.  Pt has leg cramps with Flexeril and has nausea and vomiting with percocet Will try Norco 2 tablets and heating pad Pt's pain improved and pt ready to go home- will give prescription for #16 Norco FHR baseline 145bpm; 6-25bpm variability and 15x15 accelerations; no decelerations noted    Assessment and Plan  Back pain in pregnancy- Hx PE with Leiden Factor V F/u  with OB on Monday- return sooner if increase in symptoms.  LINEBERRY,SUSAN 06/14/2013, 9:02 PM

## 2013-06-14 NOTE — MAU Note (Signed)
Wende Bushy NP notified of pt.

## 2013-06-14 NOTE — MAU Note (Signed)
Wende Bushy NP at the bedside.

## 2013-06-20 ENCOUNTER — Encounter (HOSPITAL_COMMUNITY): Payer: Self-pay | Admitting: *Deleted

## 2013-06-20 ENCOUNTER — Inpatient Hospital Stay (HOSPITAL_COMMUNITY)
Admission: AD | Admit: 2013-06-20 | Discharge: 2013-06-20 | Disposition: A | Discharge status: Home / Self Care | Payer: BC Managed Care – PPO | Source: Ambulatory Visit | Attending: Obstetrics & Gynecology | Admitting: Obstetrics & Gynecology

## 2013-06-20 ENCOUNTER — Inpatient Hospital Stay (HOSPITAL_COMMUNITY): Payer: BC Managed Care – PPO

## 2013-06-20 DIAGNOSIS — O99891 Other specified diseases and conditions complicating pregnancy: Secondary | ICD-10-CM | POA: Insufficient documentation

## 2013-06-20 DIAGNOSIS — M545 Low back pain, unspecified: Secondary | ICD-10-CM | POA: Insufficient documentation

## 2013-06-20 DIAGNOSIS — R1011 Right upper quadrant pain: Secondary | ICD-10-CM | POA: Insufficient documentation

## 2013-06-20 DIAGNOSIS — O479 False labor, unspecified: Secondary | ICD-10-CM | POA: Insufficient documentation

## 2013-06-20 DIAGNOSIS — O321XX0 Maternal care for breech presentation, not applicable or unspecified: Secondary | ICD-10-CM | POA: Insufficient documentation

## 2013-06-20 DIAGNOSIS — Z86711 Personal history of pulmonary embolism: Secondary | ICD-10-CM | POA: Insufficient documentation

## 2013-06-20 LAB — URINALYSIS, ROUTINE W REFLEX MICROSCOPIC
Bilirubin Urine: NEGATIVE
Ketones, ur: NEGATIVE mg/dL
Nitrite: NEGATIVE
Urobilinogen, UA: 0.2 mg/dL (ref 0.0–1.0)

## 2013-06-20 LAB — CREATININE, SERUM: GFR calc Af Amer: >90 mL/min (ref >90)

## 2013-06-20 MED ORDER — IOHEXOL 350 MG/ML SOLN
100.0000 mL | Freq: Once | INTRAVENOUS | Status: AC | PRN
  Administered 2013-06-20: 100 mL via INTRAVENOUS

## 2013-06-20 NOTE — MAU Provider Note (Signed)
History     CSN: [id number]  Arrival date and time: 06/20/13 1534   First Provider Initiated Contact with Patient 06/20/13 1624      No chief complaint on file.  HPI Robin Key is 27 y.o. G1P0000 [redacted]w[redacted]d weeks presenting with lower back pain X 1 week ago, continues but now the right side "up to the rib cage" is hurting.  This pain began today.  She is nauseated--tried Zofran and Gas-X without relief.  Has scheduled C-Section for 11/25 for Factor 5 and breech presentation.  Fetus very active.  Neg for vaginal bleeding or leaking of fluid.  Hx of spontaneous pulmonary embolus in 2012 that presented as shoulder pain without shortness of breath or tachypnea. IT was thought to be gall stones but was PE. She does not think this is muscle type pain but it is not like the previous pain with PE.   Reports her anti-coagulant was recently changed from Lovenox to Heparin.   Past Medical History  Diagnosis Date  . Pulmonary embolism Nov 2012  . PCOS (polycystic ovarian syndrome)   . PONV (postoperative nausea and vomiting)   . Headache(784.0)   . Factor V Leiden   . Infection     UTI    Past Surgical History  Procedure Laterality Date  . Bladder stem stretched      Family History  Problem Relation Age of Onset  . Cancer Mother     skin  . Heart disease Father   . Miscarriages / Stillbirths Sister   . Diabetes Maternal Grandmother   . Cancer Maternal Grandmother     breast  . Osteoporosis Maternal Grandmother   . Glaucoma Maternal Grandmother   . Vision loss Maternal Grandmother   . Heart disease Maternal Grandfather   . Hypertension Maternal Grandfather   . Stroke Maternal Grandfather   . Dementia Paternal Grandmother   . Heart disease Paternal Grandfather     History  Substance Use Topics  . Smoking status: Never Smoker   . Smokeless tobacco: Never Used  . Alcohol Use: No    Allergies:  Allergies  Allergen Reactions  . Bactrim [Sulfamethoxazole-Trimethoprim] Other  (See Comments)    Headache   . Ciprofloxacin Other (See Comments)    Felt like "bugs crawling"  . Latex Rash  . Penicillins Rash    Prescriptions prior to admission  Medication Sig Dispense Refill  . acetaminophen (TYLENOL) 500 MG tablet Take 1,000 mg by mouth every 6 (six) hours as needed for pain.      . butalbital-acetaminophen-caffeine (FIORICET, ESGIC) 50-325-40 MG per tablet Take 1 tablet by mouth 2 (two) times daily as needed for headache.      . cetirizine (ZYRTEC) 10 MG tablet Take 10 mg by mouth daily.      . cyclobenzaprine (FLEXERIL) 10 MG tablet Take 0.5-1 tablets (5-10 mg total) by mouth 3 (three) times daily as needed for muscle spasms.  30 tablet  0  . Doxylamine-Pyridoxine (DICLEGIS) 10-10 MG TBEC Take 20 mg by mouth at bedtime.  90 tablet  0  . heparin 29528 UNIT/ML injection Inject 10,000 Units into the skin every 12 (twelve) hours.      . ondansetron (ZOFRAN-ODT) 8 MG disintegrating tablet Take 8 mg by mouth every 8 (eight) hours as needed for nausea or vomiting.      . promethazine (PHENERGAN) 25 MG tablet Take 1 tablet (25 mg total) by mouth every 6 (six) hours as needed for nausea.  30 tablet  0  . Simethicone (GAS-X PO) Take 2 tablets by mouth daily as needed (gas pain).        Review of Systems  Constitutional: Negative for fever and chills.  Gastrointestinal: Positive for nausea and abdominal pain. Negative for vomiting.  Genitourinary: Negative for dysuria, urgency and frequency.       Neg for vaginal bleeding or leaking of fluid.   Neurological: Negative for headaches.   Physical Exam   Blood pressure 127/84, pulse 98, temperature 98.1 F (36.7 C), temperature source Oral, resp. rate 16, height 5\' 9"  (1.753 m), weight 246 lb (111.585 kg), SpO2 100.00%.  Physical Exam  Nursing note and vitals reviewed. Constitutional: She is oriented to person, place, and time. She appears well-developed and well-nourished. No distress.  HENT:  Head: Normocephalic.   Neck: Normal range of motion.  Cardiovascular: Normal heart sounds.   Respiratory: Effort normal and breath sounds normal. No respiratory distress. She has no wheezes. She has no rales. She exhibits no tenderness.  GI: There is tenderness (upper right tenderness ). There is no rebound and no guarding.  Genitourinary: No bleeding around the vagina. No vaginal discharge found.  Cervical exam by Baxter Hire, RN.  External os 1 cm soft, internal os closed.  70% effaced.   Neurological: She is alert and oriented to person, place, and time.  Skin: Skin is warm and dry.  Psychiatric: She has a normal mood and affect. Her behavior is normal.   Results for orders placed during the hospital encounter of 06/20/13 (from the past 24 hour(s))  URINALYSIS, ROUTINE W REFLEX MICROSCOPIC     Status: None   Collection Time    06/20/13  3:40 PM      Result Value Range   Color, Urine YELLOW  YELLOW   APPearance CLEAR  CLEAR   Specific Gravity, Urine 1.010  1.005 - 1.030   pH 5.5  5.0 - 8.0   Glucose, UA NEGATIVE  NEGATIVE mg/dL   Hgb urine dipstick NEGATIVE  NEGATIVE   Bilirubin Urine NEGATIVE  NEGATIVE   Ketones, ur NEGATIVE  NEGATIVE mg/dL   Protein, ur NEGATIVE  NEGATIVE mg/dL   Urobilinogen, UA 0.2  0.0 - 1.0 mg/dL   Nitrite NEGATIVE  NEGATIVE   Leukocytes, UA NEGATIVE  NEGATIVE  CREATININE, SERUM     Status: None   Collection Time    06/20/13  5:45 PM      Result Value Range   Creatinine, Ser 0.62  0.50 - 1.10 mg/dL   GFR calc non Af Amer >90  >90 mL/min   GFR calc Af Amer >90  >90 mL/min   MAU Course  Procedures   FMS--reactive, FHR 145, mod variability.   Occasional irregular contraction. Irritability  MDM Consulted with Dr. Langston Masker.  Order given for CT to r/o PE if not musculoskeletal pain.  I went in to discuss with the patient.  18:40  Patient is in CT.  Care turned over to J. Ethier, PA at 19:00 Assessment and Plan    KEY,[name] 06/20/2013, 6:35 PM   Ct Angio Chest W/cm &/or Wo  Cm  06/20/2013   CLINICAL DATA:  Right upper quadrant pain with history of pulmonary embolus.  EXAM: CT ANGIOGRAPHY CHEST WITH CONTRAST  TECHNIQUE: Multidetector CT imaging of the chest was performed using the standard protocol during bolus administration of intravenous contrast. Multiplanar CT image reconstructions including MIPs were obtained to evaluate the vascular anatomy.  CONTRAST:  OMNIPAQUE IOHEXOL 350 MG/ML SOLN  COMPARISON:  08/22/2008.  FINDINGS: No pulmonary embolus. No pathologically enlarged mediastinal, hilar or axillary lymph nodes. Heart is enlarged. No pericardial effusion.  No pleural effusion. Overall increased density in the lungs is most likely due to expiratory phase imaging. Respiratory motion degrades image quality. Airway is unremarkable.  Incidental imaging of the upper abdomen shows no acute findings. No worrisome lytic or sclerotic lesions.  Review of the MIP images confirms the above findings.  IMPRESSION: Negative for pulmonary embolus. No findings to explain the patient's right upper quadrant pain.   Electronically Signed   By: Leanna Battles M.D.   On: 06/20/2013 19:21    MDM 1900 - Care assumed from Jeani Sow, NP. CT results pending Discussed CT results with Dr. Langston Masker. Ok for discharge and follow-up in the office this week as scheduled.  Patient requested work excuse for this weekend. Dr. Langston Masker agrees.   A: Abdominal pain in pregnancy  P: Discharge home Patient advised to follow-up with Dr. Henderson Cloud as scheduled or call for an earlier appointment if her condition were to change or worsen Patient may return to MAU as needed or if her condition were to change or worsen  Freddi Starr, PA-C 06/20/2013 7:16 PM

## 2013-06-20 NOTE — MAU Note (Signed)
Pt nauseated on the way to work, had taken zofran at 0530 without relief, while at work, low abdominal cramping began @ 0600.  Denies any leaking or bleeding.

## 2013-06-24 ENCOUNTER — Encounter (HOSPITAL_COMMUNITY): Payer: Self-pay | Admitting: Pharmacist

## 2013-06-30 ENCOUNTER — Encounter (HOSPITAL_COMMUNITY): Payer: Self-pay

## 2013-06-30 ENCOUNTER — Encounter (HOSPITAL_COMMUNITY)
Admission: RE | Admit: 2013-06-30 | Discharge: 2013-06-30 | Disposition: A | Discharge status: Home / Self Care | Payer: BC Managed Care – PPO | Source: Ambulatory Visit | Attending: Obstetrics and Gynecology | Admitting: Obstetrics and Gynecology

## 2013-06-30 HISTORY — DX: Disease of blood and blood-forming organs, unspecified: D75.9

## 2013-06-30 LAB — CBC
HCT: 33.2 % — ABNORMAL LOW (ref 36.0–46.0)
Hemoglobin: 10.8 g/dL — ABNORMAL LOW (ref 12.0–15.0)
MCHC: 32.5 g/dL (ref 30.0–36.0)
RBC: 4.41 MIL/uL (ref 3.87–5.11)
RDW: 14.7 % (ref 11.5–15.5)

## 2013-06-30 LAB — TYPE AND SCREEN: ABO/RH(D): A POS

## 2013-06-30 LAB — RPR: RPR Ser Ql: NONREACTIVE

## 2013-06-30 MED ORDER — GENTAMICIN SULFATE 40 MG/ML IJ SOLN
INTRAVENOUS | Status: AC
  Administered 2013-07-01: 100 mL via INTRAVENOUS
  Filled 2013-06-30: qty 10.25

## 2013-06-30 MED ORDER — GENTAMICIN SULFATE 40 MG/ML IJ SOLN: INTRAVENOUS | Status: DC

## 2013-06-30 NOTE — Patient Instructions (Signed)
   Your procedure is scheduled on:   Tuesday, November 25  Enter through the [employer] [facility] at: 1130 AM Pick up the phone at the desk and dial [phone] and inform us of your arrival.  Please call this number if you have any problems the morning of surgery: [phone]  Remember: Do not eat food after midnight: Monday Do not drink clear liquids after: 9 AM Tuesday, day of surgery Take these medicines the morning of surgery with a SIP OF WATER:  Do not wear jewelry, make-up, or FINGER nail polish No metal in your hair or on your body. Do not wear lotions, powders, perfumes. You may wear deodorant.  Please use your CHG wash as directed prior to surgery.  Do not shave anywhere for at least 12 hours prior to first CHG shower.  Do not bring valuables to the hospital. Contacts, dentures or bridgework may not be worn into surgery.  Leave suitcase in the car. After Surgery it may be brought to your room. For patients being admitted to the hospital, checkout time is 11:00am the day of discharge.

## 2013-06-30 NOTE — Patient Instructions (Addendum)
Your procedure is scheduled on:07/01/13  Enter through the Main Entrance at :1130 am Pick up desk phone and dial [phone] and inform us of your arrival.  Please call [phone] if you have any problems the morning of surgery.  Remember: Do not eat food  after midnight: tonight- per pt Dr. Henderson Cloud ok'd food until 2am Clear liquids are ok until: 9am on Tuesday   You may brush your teeth the morning of surgery.   DO NOT wear jewelry, eye make-up, lipstick,body lotion, or dark fingernail polish.  (Polished toes are ok) You may wear deodorant.  If you are to be admitted after surgery, leave suitcase in car until your room has been assigned.

## 2013-06-30 NOTE — H&P (Signed)
Robin Key is a 27 y.o. female presenting for primary cesarean section for breech. Pregnancy complicated by history of PE and heterozygous for Factor V Leiden deficiency. Has been anticoagulated this pregnancy with Lovenox 150mg , SQ daily. Was converted to heparin bid. Last dose of heparin to be taken in evening prior to surgery. Maternal Medical History:  Fetal activity: Perceived fetal activity is normal.      OB History   Grav Para Term Preterm Abortions TAB SAB Ect Mult Living   1 0 0 0 0 0 0 0 0 0      Past Medical History  Diagnosis Date  . Pulmonary embolism Nov 2012  . PCOS (polycystic ovarian syndrome)   . PONV (postoperative nausea and vomiting)   . Headache(784.0)   . Factor V Leiden   . Infection     UTI  . Blood dyscrasia     factor 5   Past Surgical History  Procedure Laterality Date  . Bladder stem stretched     Family History: family history includes Cancer in her maternal grandmother and mother; Dementia in her paternal grandmother; Diabetes in her maternal grandmother; Glaucoma in her maternal grandmother; Heart disease in her father, maternal grandfather, and paternal grandfather; Hypertension in her maternal grandfather; Miscarriages / [place] in her sister; Osteoporosis in her maternal grandmother; Stroke in her maternal grandfather; Vision loss in her maternal grandmother. Social History:  reports that she has never smoked. She has never used smokeless tobacco. She reports that she does not drink alcohol or use illicit drugs.   Prenatal Transfer Tool  Maternal Diabetes: No Genetic Screening: Normal Maternal Ultrasounds/Referrals: Normal Fetal Ultrasounds or other Referrals:  None Maternal Substance Abuse:  No Significant Maternal Medications:  None Significant Maternal Lab Results:  None Other Comments:  None  Review of Systems  Eyes: Negative for blurred vision.  Gastrointestinal: Negative for abdominal pain.  Neurological: Negative for  headaches.      There were no vitals taken for this visit. Maternal Exam:  Abdomen: Fetal presentation: breech     Physical Exam  Cardiovascular: Normal rate and regular rhythm.   Respiratory: Effort normal and breath sounds normal.  GI: Soft. There is no tenderness.  Neurological: She has normal reflexes.    Prenatal labs: ABO, Rh: --/--/A POS, A POS (11/24 1420) Antibody: NEG (11/24 1420) Rubella: Immune (05/06 0000) RPR: Nonreactive (05/06 0000)  HBsAg: Negative (05/06 0000)  HIV: Non-reactive (05/06 0000)  GBS:     Assessment/Plan: 27 yo G1P0 with breech baby for C/S D/W patient risks including infection, organ damage, bleeding/transfusion-HIV/Hep, DVT/PE, pneumonia. Will resume Lovenox 12-24 hours post op. All questions answered.   Faline Langer II,Kanetra Ho E 06/30/2013, 5:01 PM

## 2013-06-30 NOTE — Pre-Procedure Instructions (Signed)
Printer not working for me to print pt instructions. Pt took a pic of the inst on the computer with her cell phone camera.

## 2013-07-01 ENCOUNTER — Inpatient Hospital Stay (HOSPITAL_COMMUNITY)
Admission: RE | Admit: 2013-07-01 | Discharge: 2013-07-03 | DRG: 765 | Disposition: A | Discharge status: Home / Self Care | Payer: BC Managed Care – PPO | Source: Ambulatory Visit | Attending: Obstetrics and Gynecology | Admitting: Obstetrics and Gynecology

## 2013-07-01 ENCOUNTER — Encounter (HOSPITAL_COMMUNITY)
Admission: RE | Disposition: A | Discharge status: Home / Self Care | Payer: Self-pay | Source: Ambulatory Visit | Attending: Obstetrics and Gynecology

## 2013-07-01 ENCOUNTER — Encounter (HOSPITAL_COMMUNITY): Payer: BC Managed Care – PPO | Admitting: Anesthesiology

## 2013-07-01 ENCOUNTER — Encounter (HOSPITAL_COMMUNITY): Payer: Self-pay | Admitting: Anesthesiology

## 2013-07-01 ENCOUNTER — Inpatient Hospital Stay (HOSPITAL_COMMUNITY): Payer: BC Managed Care – PPO | Admitting: Anesthesiology

## 2013-07-01 DIAGNOSIS — O321XX0 Maternal care for breech presentation, not applicable or unspecified: Principal | ICD-10-CM | POA: Diagnosis present

## 2013-07-01 DIAGNOSIS — D6859 Other primary thrombophilia: Secondary | ICD-10-CM | POA: Diagnosis present

## 2013-07-01 DIAGNOSIS — D689 Coagulation defect, unspecified: Secondary | ICD-10-CM | POA: Diagnosis present

## 2013-07-01 DIAGNOSIS — Z86711 Personal history of pulmonary embolism: Secondary | ICD-10-CM

## 2013-07-01 LAB — COMPREHENSIVE METABOLIC PANEL
ALT: 7 U/L (ref 0–35)
Alkaline Phosphatase: 194 U/L — ABNORMAL HIGH (ref 39–117)
BUN: 4 mg/dL — ABNORMAL LOW (ref 6–23)
CO2: 22 mEq/L (ref 19–32)
Creatinine, Ser: 0.57 mg/dL (ref 0.50–1.10)
GFR calc Af Amer: >90 mL/min (ref >90)
GFR calc non Af Amer: >90 mL/min (ref >90)
Glucose, Bld: 90 mg/dL (ref 70–99)
Potassium: 3.5 mEq/L (ref 3.5–5.1)
Total Bilirubin: 0.5 mg/dL (ref 0.3–1.2)
Total Protein: 6.7 g/dL (ref 6.0–8.3)

## 2013-07-01 LAB — PROTIME-INR
INR: 0.98 (ref 0.00–1.49)
Prothrombin Time: 12.8 seconds (ref 11.6–15.2)

## 2013-07-01 SURGERY — Surgical Case
Anesthesia: Spinal | Site: Abdomen | Wound class: Clean Contaminated

## 2013-07-01 MED ORDER — DIPHENHYDRAMINE HCL 25 MG PO CAPS
25.0000 mg | ORAL_CAPSULE | ORAL | Status: DC | PRN
  Filled 2013-07-01: qty 1

## 2013-07-01 MED ORDER — CYCLOBENZAPRINE HCL 5 MG PO TABS
5.0000 mg | ORAL_TABLET | Freq: Three times a day (TID) | ORAL | Status: DC | PRN
  Administered 2013-07-02: 5 mg via ORAL
  Filled 2013-07-01: qty 2

## 2013-07-01 MED ORDER — SCOPOLAMINE 1 MG/3DAYS TD PT72
1.0000 | MEDICATED_PATCH | Freq: Once | TRANSDERMAL | Status: DC
  Administered 2013-07-01: 1.5 mg via TRANSDERMAL

## 2013-07-01 MED ORDER — ONDANSETRON HCL 4 MG/2ML IJ SOLN
INTRAMUSCULAR | Status: AC
  Administered 2013-07-01: 4 mg via INTRAVENOUS
  Filled 2013-07-01: qty 2

## 2013-07-01 MED ORDER — FENTANYL CITRATE 0.05 MG/ML IJ SOLN
25.0000 ug | INTRAMUSCULAR | Status: DC | PRN
  Administered 2013-07-01: 50 ug via INTRAVENOUS

## 2013-07-01 MED ORDER — MAGNESIUM HYDROXIDE 400 MG/5ML PO SUSP: 30.0000 mL | ORAL | Status: DC | PRN

## 2013-07-01 MED ORDER — METOCLOPRAMIDE HCL 5 MG/ML IJ SOLN: 10.0000 mg | Freq: Three times a day (TID) | INTRAMUSCULAR | Status: DC | PRN

## 2013-07-01 MED ORDER — FENTANYL CITRATE 0.05 MG/ML IJ SOLN
INTRAMUSCULAR | Status: DC | PRN
  Administered 2013-07-01: 25 ug via INTRATHECAL

## 2013-07-01 MED ORDER — ONDANSETRON HCL 4 MG/2ML IJ SOLN: 4.0000 mg | INTRAMUSCULAR | Status: DC | PRN

## 2013-07-01 MED ORDER — PHENYLEPHRINE HCL 10 MG/ML IJ SOLN
INTRAMUSCULAR | Status: DC | PRN
  Administered 2013-07-01 (×2): 40 ug via INTRAVENOUS

## 2013-07-01 MED ORDER — DEXTROSE 5 % IV SOLN
1.0000 ug/kg/h | INTRAVENOUS | Status: DC | PRN
  Filled 2013-07-01: qty 2

## 2013-07-01 MED ORDER — ONDANSETRON HCL 4 MG/2ML IJ SOLN: 4.0000 mg | Freq: Three times a day (TID) | INTRAMUSCULAR | Status: DC | PRN

## 2013-07-01 MED ORDER — NALBUPHINE SYRINGE 5 MG/0.5 ML
5.0000 mg | INJECTION | INTRAMUSCULAR | Status: DC | PRN
  Administered 2013-07-02: 10 mg via SUBCUTANEOUS
  Filled 2013-07-01: qty 1

## 2013-07-01 MED ORDER — OXYTOCIN 10 UNIT/ML IJ SOLN
40.0000 [IU] | INTRAVENOUS | Status: DC | PRN
  Administered 2013-07-01: 40 [IU] via INTRAVENOUS

## 2013-07-01 MED ORDER — HYDROMORPHONE HCL 2 MG PO TABS
2.0000 mg | ORAL_TABLET | ORAL | Status: DC | PRN
  Administered 2013-07-02: 2 mg via ORAL
  Filled 2013-07-01: qty 1

## 2013-07-01 MED ORDER — METHYLERGONOVINE MALEATE 0.2 MG/ML IJ SOLN: 0.2000 mg | INTRAMUSCULAR | Status: DC | PRN

## 2013-07-01 MED ORDER — METHYLERGONOVINE MALEATE 0.2 MG PO TABS: 0.2000 mg | ORAL_TABLET | ORAL | Status: DC | PRN

## 2013-07-01 MED ORDER — KETOROLAC TROMETHAMINE 30 MG/ML IJ SOLN: 30.0000 mg | Freq: Four times a day (QID) | INTRAMUSCULAR | Status: DC | PRN

## 2013-07-01 MED ORDER — SODIUM CHLORIDE 0.9 % IJ SOLN: 3.0000 mL | INTRAMUSCULAR | Status: DC | PRN

## 2013-07-01 MED ORDER — SCOPOLAMINE 1 MG/3DAYS TD PT72
MEDICATED_PATCH | TRANSDERMAL | Status: AC
  Administered 2013-07-01: 1.5 mg via TRANSDERMAL
  Filled 2013-07-01: qty 1

## 2013-07-01 MED ORDER — PROMETHAZINE HCL 25 MG/ML IJ SOLN: 6.2500 mg | INTRAMUSCULAR | Status: DC | PRN

## 2013-07-01 MED ORDER — OXYTOCIN 40 UNITS IN LACTATED RINGERS INFUSION - SIMPLE MED: 62.5000 mL/h | INTRAVENOUS | Status: AC

## 2013-07-01 MED ORDER — FENTANYL CITRATE 0.05 MG/ML IJ SOLN
INTRAMUSCULAR | Status: AC
  Filled 2013-07-01: qty 2

## 2013-07-01 MED ORDER — ZOLPIDEM TARTRATE 5 MG PO TABS: 5.0000 mg | ORAL_TABLET | Freq: Every evening | ORAL | Status: DC | PRN

## 2013-07-01 MED ORDER — MEPERIDINE HCL 25 MG/ML IJ SOLN: 6.2500 mg | INTRAMUSCULAR | Status: DC | PRN

## 2013-07-01 MED ORDER — OXYTOCIN 10 UNIT/ML IJ SOLN
INTRAMUSCULAR | Status: AC
  Filled 2013-07-01: qty 4

## 2013-07-01 MED ORDER — MORPHINE SULFATE (PF) 0.5 MG/ML IJ SOLN
INTRAMUSCULAR | Status: DC | PRN
  Administered 2013-07-01: .15 mg via INTRATHECAL

## 2013-07-01 MED ORDER — DIBUCAINE 1 % RE OINT: 1.0000 "application " | TOPICAL_OINTMENT | RECTAL | Status: DC | PRN

## 2013-07-01 MED ORDER — MIDAZOLAM HCL 2 MG/2ML IJ SOLN: 0.5000 mg | Freq: Once | INTRAMUSCULAR | Status: DC | PRN

## 2013-07-01 MED ORDER — SIMETHICONE 80 MG PO CHEW: 80.0000 mg | CHEWABLE_TABLET | ORAL | Status: DC | PRN

## 2013-07-01 MED ORDER — ACETAMINOPHEN 500 MG PO TABS
1000.0000 mg | ORAL_TABLET | Freq: Four times a day (QID) | ORAL | Status: DC
  Administered 2013-07-01: 1000 mg via ORAL
  Filled 2013-07-01: qty 2

## 2013-07-01 MED ORDER — MORPHINE SULFATE 0.5 MG/ML IJ SOLN
INTRAMUSCULAR | Status: AC
  Filled 2013-07-01: qty 10

## 2013-07-01 MED ORDER — MENTHOL 3 MG MT LOZG: 1.0000 | LOZENGE | OROMUCOSAL | Status: DC | PRN

## 2013-07-01 MED ORDER — IBUPROFEN 600 MG PO TABS: 600.0000 mg | ORAL_TABLET | Freq: Four times a day (QID) | ORAL | Status: DC | PRN

## 2013-07-01 MED ORDER — NALOXONE HCL 0.4 MG/ML IJ SOLN: 0.4000 mg | INTRAMUSCULAR | Status: DC | PRN

## 2013-07-01 MED ORDER — SIMETHICONE 80 MG PO CHEW
80.0000 mg | CHEWABLE_TABLET | ORAL | Status: DC
  Administered 2013-07-01 – 2013-07-02 (×2): 80 mg via ORAL
  Filled 2013-07-01 (×2): qty 1

## 2013-07-01 MED ORDER — ONDANSETRON HCL 4 MG PO TABS: 4.0000 mg | ORAL_TABLET | ORAL | Status: DC | PRN

## 2013-07-01 MED ORDER — SENNOSIDES-DOCUSATE SODIUM 8.6-50 MG PO TABS
2.0000 | ORAL_TABLET | ORAL | Status: DC
  Administered 2013-07-01 – 2013-07-02 (×2): 2 via ORAL
  Filled 2013-07-01 (×2): qty 2

## 2013-07-01 MED ORDER — ONDANSETRON HCL 4 MG/2ML IJ SOLN: 4.0000 mg | Freq: Once | INTRAMUSCULAR | Status: DC

## 2013-07-01 MED ORDER — LACTATED RINGERS IV SOLN
INTRAVENOUS | Status: DC | PRN
  Administered 2013-07-01: 14:00:00 via INTRAVENOUS

## 2013-07-01 MED ORDER — WITCH HAZEL-GLYCERIN EX PADS: 1.0000 "application " | MEDICATED_PAD | CUTANEOUS | Status: DC | PRN

## 2013-07-01 MED ORDER — DIPHENHYDRAMINE HCL 50 MG/ML IJ SOLN: 12.5000 mg | INTRAMUSCULAR | Status: DC | PRN

## 2013-07-01 MED ORDER — PHENYLEPHRINE 8 MG IN D5W 100 ML (0.08MG/ML) PREMIX OPTIME
INJECTION | INTRAVENOUS | Status: DC | PRN
  Administered 2013-07-01: 60 ug/min via INTRAVENOUS

## 2013-07-01 MED ORDER — ENOXAPARIN SODIUM 150 MG/ML ~~LOC~~ SOLN
150.0000 mg | SUBCUTANEOUS | Status: DC
  Administered 2013-07-02 – 2013-07-03 (×2): 150 mg via SUBCUTANEOUS
  Filled 2013-07-01 (×3): qty 1

## 2013-07-01 MED ORDER — HYDROMORPHONE HCL PF 1 MG/ML IJ SOLN
INTRAMUSCULAR | Status: AC
  Filled 2013-07-01: qty 1

## 2013-07-01 MED ORDER — DIPHENHYDRAMINE HCL 50 MG/ML IJ SOLN: 25.0000 mg | INTRAMUSCULAR | Status: DC | PRN

## 2013-07-01 MED ORDER — DIPHENHYDRAMINE HCL 25 MG PO CAPS: 25.0000 mg | ORAL_CAPSULE | Freq: Four times a day (QID) | ORAL | Status: DC | PRN

## 2013-07-01 MED ORDER — SCOPOLAMINE 1 MG/3DAYS TD PT72: 1.0000 | MEDICATED_PATCH | Freq: Once | TRANSDERMAL | Status: DC

## 2013-07-01 MED ORDER — SIMETHICONE 80 MG PO CHEW
80.0000 mg | CHEWABLE_TABLET | Freq: Three times a day (TID) | ORAL | Status: DC
  Administered 2013-07-02 – 2013-07-03 (×4): 80 mg via ORAL
  Filled 2013-07-01 (×6): qty 1

## 2013-07-01 MED ORDER — TETANUS-DIPHTH-ACELL PERTUSSIS 5-2.5-18.5 LF-MCG/0.5 IM SUSP: 0.5000 mL | Freq: Once | INTRAMUSCULAR | Status: DC

## 2013-07-01 MED ORDER — ONDANSETRON HCL 4 MG/2ML IJ SOLN
INTRAMUSCULAR | Status: AC
  Filled 2013-07-01: qty 2

## 2013-07-01 MED ORDER — ONDANSETRON HCL 4 MG/2ML IJ SOLN
4.0000 mg | Freq: Once | INTRAMUSCULAR | Status: AC
  Administered 2013-07-01: 4 mg via INTRAVENOUS

## 2013-07-01 MED ORDER — ACETAMINOPHEN 325 MG PO TABS: 650.0000 mg | ORAL_TABLET | Freq: Four times a day (QID) | ORAL | Status: DC | PRN

## 2013-07-01 MED ORDER — LANOLIN HYDROUS EX OINT: 1.0000 "application " | TOPICAL_OINTMENT | CUTANEOUS | Status: DC | PRN

## 2013-07-01 MED ORDER — PHENYLEPHRINE 8 MG IN D5W 100 ML (0.08MG/ML) PREMIX OPTIME
INJECTION | INTRAVENOUS | Status: AC
  Filled 2013-07-01: qty 100

## 2013-07-01 MED ORDER — PRENATAL MULTIVITAMIN CH
1.0000 | ORAL_TABLET | Freq: Every day | ORAL | Status: DC
  Filled 2013-07-01 (×2): qty 1

## 2013-07-01 MED ORDER — LACTATED RINGERS IV SOLN
Freq: Once | INTRAVENOUS | Status: AC
  Administered 2013-07-01: 12:00:00 via INTRAVENOUS

## 2013-07-01 MED ORDER — NALBUPHINE SYRINGE 5 MG/0.5 ML
5.0000 mg | INJECTION | INTRAMUSCULAR | Status: DC | PRN
  Filled 2013-07-01 (×2): qty 1

## 2013-07-01 MED ORDER — LACTATED RINGERS IV SOLN
INTRAVENOUS | Status: DC
  Administered 2013-07-01: 22:00:00 via INTRAVENOUS

## 2013-07-01 MED ORDER — LACTATED RINGERS IV SOLN
INTRAVENOUS | Status: DC | PRN
  Administered 2013-07-01 (×3): via INTRAVENOUS

## 2013-07-01 MED ORDER — BUPIVACAINE IN DEXTROSE 0.75-8.25 % IT SOLN
INTRATHECAL | Status: DC | PRN
  Administered 2013-07-01: 1.6 mL via INTRATHECAL

## 2013-07-01 SURGICAL SUPPLY — 38 items
ADH SKN CLS APL DERMABOND .7 (GAUZE/BANDAGES/DRESSINGS)
APL SKNCLS STERI-STRIP NONHPOA (GAUZE/BANDAGES/DRESSINGS)
BENZOIN TINCTURE PRP APPL 2/3 (GAUZE/BANDAGES/DRESSINGS) IMPLANT
CLAMP CORD UMBIL (MISCELLANEOUS) IMPLANT
CLOTH BEACON ORANGE TIMEOUT ST (SAFETY) ×2 IMPLANT
CONTAINER PREFILL 10% NBF 15ML (MISCELLANEOUS) IMPLANT
DERMABOND ADVANCED (GAUZE/BANDAGES/DRESSINGS)
DERMABOND ADVANCED .7 DNX12 (GAUZE/BANDAGES/DRESSINGS) IMPLANT
DRAPE LG THREE QUARTER DISP (DRAPES) IMPLANT
DRSG OPSITE POSTOP 4X10 (GAUZE/BANDAGES/DRESSINGS) ×2 IMPLANT
DURAPREP 26ML APPLICATOR (WOUND CARE) ×2 IMPLANT
ELECT REM PT RETURN 9FT ADLT (ELECTROSURGICAL) ×2
ELECTRODE REM PT RTRN 9FT ADLT (ELECTROSURGICAL) ×1 IMPLANT
EXTRACTOR VACUUM M CUP 4 TUBE (SUCTIONS) IMPLANT
GAUZE SPONGE 4X4 12PLY STRL LF (GAUZE/BANDAGES/DRESSINGS) ×1 IMPLANT
GLOVE BIO SURGEON STRL SZ7.5 (GLOVE) ×2 IMPLANT
GOWN STRL REIN XL XLG (GOWN DISPOSABLE) ×4 IMPLANT
KIT ABG SYR 3ML LUER SLIP (SYRINGE) ×2 IMPLANT
NDL HYPO 25X5/8 SAFETYGLIDE (NEEDLE) ×1 IMPLANT
NEEDLE HYPO 25X5/8 SAFETYGLIDE (NEEDLE) ×2 IMPLANT
NS IRRIG 1000ML POUR BTL (IV SOLUTION) ×2 IMPLANT
PACK C SECTION WH (CUSTOM PROCEDURE TRAY) ×2 IMPLANT
PAD ABD 7.5X8 STRL (GAUZE/BANDAGES/DRESSINGS) ×1 IMPLANT
PAD OB MATERNITY 4.3X12.25 (PERSONAL CARE ITEMS) ×2 IMPLANT
SPONGE LAP 18X18 X RAY DECT (DISPOSABLE) ×1 IMPLANT
SPONGE SURGIFOAM ABS GEL 12-7 (HEMOSTASIS) ×1 IMPLANT
STAPLER VISISTAT 35W (STAPLE) ×1 IMPLANT
STRIP CLOSURE SKIN 1/2X4 (GAUZE/BANDAGES/DRESSINGS) ×1 IMPLANT
SUT CHROMIC 2 0 SH (SUTURE) ×2 IMPLANT
SUT MNCRL 0 VIOLET CTX 36 (SUTURE) ×4 IMPLANT
SUT MNCRL AB 0 CT1 27 (SUTURE) ×1 IMPLANT
SUT MONOCRYL 0 CTX 36 (SUTURE) ×4
SUT PDS AB 0 CTX 60 (SUTURE) ×2 IMPLANT
SUT PLAIN 0 NONE (SUTURE) IMPLANT
SUT VIC AB 4-0 KS 27 (SUTURE) ×2 IMPLANT
TOWEL OR 17X24 6PK STRL BLUE (TOWEL DISPOSABLE) ×2 IMPLANT
TRAY FOLEY CATH 14FR (SET/KITS/TRAYS/PACK) ×2 IMPLANT
WATER STERILE IRR 1000ML POUR (IV SOLUTION) ×2 IMPLANT

## 2013-07-01 NOTE — Consult Note (Signed)
Neonatology Note:  Attendance at C-section:  I was asked by Dr. Tomblin to attend this primary C/S at term due to breech presentation. The mother is a G1P0 A pos, GBS not found with Factor V Leiden deficiency, on Lovenox for most of pregnancy, but recently changed to Heparin. ROM at delivery, fluid clear. Infant vigorous with good spontaneous cry and tone. Needed only minimal bulb suctioning. Ap 9/9. Lungs clear to ausc in DR. To CN to care of Pediatrician.  Robin Eich C. Laiklynn Raczynski, MD  

## 2013-07-01 NOTE — Lactation Note (Signed)
This note was copied from the chart of Robin Key. Lactation Consultation Note  Patient Name: Robin Key [id number] Date: 07/01/2013 Reason for consult: Initial assessment of this primipara and her newborn at 28 hours of age.  Initial LATCH score was "5" due to flat nipples and need for repeated efforts to latch with RN assistance but later, pp RN, Steward Drone reports a LATCH score of "9" and reports that baby able to grasp areola well and demonstrate strong sucking bursts with swallows.  Mom also reports baby latching well several times this evening and is using boppy.  Per mom, her nipples are not usually flat so LC discussed how fluid retention can cause this.  LC encouraged cue feedings, frequent STS and both mom and nurse report that mom was shown hand expression technique.   LC provided [employer] brochure and reviewed [facility] services and list of community and web site resources.LC encouraged review of Baby and Me pp 14 and 20-25 for STS and BF information. Mom says she has a prescription to rent a breast pump but is not sure if she can rent from a non-DME facility.  She will check with insurance and discuss with LC when ready for pump rental.  She will also inquire about whether her insurance provides a purchased pump.   Maternal Data Formula Feeding for Exclusion: No Infant to breast within first hour of birth: Yes (initial LATCH score was 5 as reported by RN due to flat nipples) Has patient been taught Hand Expression?: Yes (per RN documentation and mom's report) Does the patient have breastfeeding experience prior to this delivery?: No  Feeding    LATCH Score/Interventions         Most recent LATCH score=9 per RN, Baltazar Apo             Lactation Tools Discussed/Used   STS, hand expression, cue feedings Discussion of pump options for return to work   Consult Status Consult Status: Follow-up Date: 07/02/13 Follow-up type: In-patient    Warrick Parisian  [facility] 07/01/2013, 11:11 PM

## 2013-07-01 NOTE — Progress Notes (Signed)
No changes to H&P per patient history Reviewed with patient procedure-cesarean section Last dose of heparin 11:15 pm last night All questions answered

## 2013-07-01 NOTE — Brief Op Note (Signed)
07/01/2013  2:25 PM  PATIENT:  Robin Key  27 y.o. female  PRE-OPERATIVE DIAGNOSIS:  breech  POST-OPERATIVE DIAGNOSIS:  breech  PROCEDURE:  Procedure(s) with comments: CESAREAN SECTION (N/A) - Primary edc 07/11/13  SURGEON:  Surgeon(s) and Role:    * Leslie Andrea, MD - Primary  PHYSICIAN ASSISTANT:   ASSISTANTS: none   ANESTHESIA:   spinal  EBL:  Total I/O In: 3100 [I.V.:3100] Out: 1000 [Urine:200; Blood:800]  BLOOD ADMINISTERED:none  DRAINS: Urinary Catheter (Foley)   LOCAL MEDICATIONS USED:  NONE  SPECIMEN:  Source of Specimen:  placenta  DISPOSITION OF SPECIMEN:  PATHOLOGY  COUNTS:  YES  TOURNIQUET:  * No tourniquets in log *  DICTATION: .Other Dictation: Dictation Number [id number]  PLAN OF CARE: Admit to inpatient   PATIENT DISPOSITION:  PACU - hemodynamically stable.   Delay start of Pharmacological VTE agent (>24hrs) due to surgical blood loss or risk of bleeding: not applicable

## 2013-07-01 NOTE — Anesthesia Procedure Notes (Signed)
Spinal  Patient location during procedure: OR Start time: 07/01/2013 1:21 PM Staffing Anesthesiologist: Brayton Caves Performed by: anesthesiologist  Preanesthetic Checklist Completed: patient identified, site marked, surgical consent, pre-op evaluation, timeout performed, IV checked, risks and benefits discussed and monitors and equipment checked Spinal Block Patient position: sitting Prep: DuraPrep Patient monitoring: heart rate, cardiac monitor, continuous pulse ox and blood pressure Approach: midline Location: L3-4 Injection technique: single-shot Needle Needle type: Sprotte  Needle gauge: 24 G Needle length: 9 cm Assessment Sensory level: T4 Additional Notes Patient identified.  Risk benefits discussed including failed block, incomplete pain control, headache, nerve damage, paralysis, blood pressure changes, nausea, vomiting, reactions to medication both toxic or allergic, and postpartum back pain.  Patient expressed understanding and wished to proceed.  All questions were answered.  Sterile technique used throughout procedure.  CSF was clear.  No parasthesia or other complications.  Please see nursing notes for vital signs.

## 2013-07-01 NOTE — Anesthesia Postprocedure Evaluation (Signed)
  Anesthesia Post Note  Patient: Robin Key  Procedure(s) Performed: Procedure(s) (LRB): CESAREAN SECTION (N/A)  Anesthesia type: Spinal  Patient location: PACU  Post pain: Pain level controlled  Post assessment: Post-op Vital signs reviewed  Last Vitals:  Filed Vitals:   07/01/13 1114  BP: 117/92  Pulse: 101  Temp: 36.9 C  Resp: 20    Post vital signs: Reviewed  Level of consciousness: awake  Complications: No apparent anesthesia complications

## 2013-07-01 NOTE — Transfer of Care (Signed)
Immediate Anesthesia Transfer of Care Note  Patient: Robin Key  Procedure(s) Performed: Procedure(s) with comments: CESAREAN SECTION (N/A) - Primary edc 07/11/13  Patient Location: PACU  Anesthesia Type:Spinal  Level of Consciousness: awake, alert , oriented and patient cooperative  Airway & Oxygen Therapy: Patient Spontanous Breathing  Post-op Assessment: Report given to PACU RN and Post -op Vital signs reviewed and stable  Post vital signs: Reviewed and stable  Complications: No apparent anesthesia complications

## 2013-07-01 NOTE — Anesthesia Preprocedure Evaluation (Signed)
Anesthesia Evaluation  Patient identified by MRN, date of birth, ID band Patient awake    Reviewed: Allergy & Precautions, H&P , NPO status , Patient's Chart, lab work & pertinent test results, reviewed documented beta blocker date and time   History of Anesthesia Complications (+) PONV  Airway       Dental  (+) Teeth Intact   Pulmonary PE (2012) breath sounds clear to auscultation        Cardiovascular negative cardio ROS  Rhythm:regular Rate:Normal     Neuro/Psych  Headaches, negative psych ROS   GI/Hepatic Neg liver ROS,   Endo/Other  PCOS  Renal/GU negative Renal ROS     Musculoskeletal   Abdominal   Peds  Hematology  (+) Blood dyscrasia (Factor V Leiden on heparin), anemia ,   Anesthesia Other Findings   Reproductive/Obstetrics (+) Pregnancy (breech)                           Anesthesia Physical Anesthesia Plan  ASA: III  Anesthesia Plan: Spinal   Post-op Pain Management:    Induction:   Airway Management Planned:   Additional Equipment:   Intra-op Plan:   Post-operative Plan:   Informed Consent: I have reviewed the patients History and Physical, chart, labs and discussed the procedure including the risks, benefits and alternatives for the proposed anesthesia with the patient or authorized representative who has indicated his/her understanding and acceptance.     Plan Discussed with: CRNA and Surgeon  Anesthesia Plan Comments:         Anesthesia Quick Evaluation

## 2013-07-02 ENCOUNTER — Encounter (HOSPITAL_COMMUNITY): Payer: Self-pay | Admitting: Obstetrics and Gynecology

## 2013-07-02 LAB — CBC
Hemoglobin: 8.8 g/dL — ABNORMAL LOW (ref 12.0–15.0)
MCH: 24.3 pg — ABNORMAL LOW (ref 26.0–34.0)
MCHC: 32.7 g/dL (ref 30.0–36.0)
Platelets: 231 10*3/uL (ref 150–400)
RDW: 15 % (ref 11.5–15.5)

## 2013-07-02 MED ORDER — HYDROCODONE-ACETAMINOPHEN 5-325 MG PO TABS
1.0000 | ORAL_TABLET | ORAL | Status: DC | PRN
  Administered 2013-07-02: 2 via ORAL
  Filled 2013-07-02: qty 2

## 2013-07-02 MED ORDER — HYDROMORPHONE HCL 2 MG PO TABS
2.0000 mg | ORAL_TABLET | ORAL | Status: DC | PRN
  Administered 2013-07-02 (×4): 2 mg via ORAL
  Filled 2013-07-02 (×4): qty 1

## 2013-07-02 MED ORDER — HYDROMORPHONE HCL 2 MG PO TABS
2.0000 mg | ORAL_TABLET | ORAL | Status: DC | PRN
  Administered 2013-07-02: 2 mg via ORAL
  Filled 2013-07-02: qty 1

## 2013-07-02 MED ORDER — CYCLOBENZAPRINE HCL 5 MG PO TABS: 5.0000 mg | ORAL_TABLET | Freq: Three times a day (TID) | ORAL | Status: DC

## 2013-07-02 MED ORDER — HYDROCODONE-ACETAMINOPHEN 5-325 MG PO TABS
1.0000 | ORAL_TABLET | ORAL | Status: DC | PRN
  Administered 2013-07-02: 1 via ORAL
  Administered 2013-07-03 (×2): 2 via ORAL
  Filled 2013-07-02 (×2): qty 2
  Filled 2013-07-02: qty 1

## 2013-07-02 MED ORDER — ACETAMINOPHEN 325 MG PO TABS
650.0000 mg | ORAL_TABLET | Freq: Four times a day (QID) | ORAL | Status: DC
  Administered 2013-07-02 (×3): 650 mg via ORAL
  Filled 2013-07-02 (×3): qty 2

## 2013-07-02 MED ORDER — CYCLOBENZAPRINE HCL 10 MG PO TABS
10.0000 mg | ORAL_TABLET | Freq: Three times a day (TID) | ORAL | Status: AC
  Administered 2013-07-02 (×3): 10 mg via ORAL
  Filled 2013-07-02 (×3): qty 1

## 2013-07-02 NOTE — Op Note (Signed)
NAMEBLONDELL, LAPERLE               ACCOUNT NO.:  [id number]  MEDICAL RECORD NO.:  [id number]  LOCATION:  9125                          FACILITY:  [facility]  PHYSICIAN:  Guy Sandifer. Henderson Cloud, M.D. DATE OF BIRTH:  [birth year]  DATE OF PROCEDURE:  07/01/2013 DATE OF DISCHARGE:                              OPERATIVE REPORT   PREOPERATIVE DIAGNOSES: 1. Breech. 2. Factor V Leiden deficiency. 3. History of pulmonary embolism.  POSTOPERATIVE DIAGNOSES: 1. Breech. 2. Factor V Leiden deficiency. 3. History of pulmonary embolism.  PROCEDURE:  Primary low transverse cesarean section.  SURGEON:  Guy Sandifer. Henderson Cloud, MD  ANESTHESIA:  Spinal.  SPECIMENS:  Placenta to pathology.  ESTIMATED BLOOD LOSS:  1000 mL.  FINDINGS:  Viable female infant, Apgars of 9 and 9 at 1 and 5 minutes respectively.  Birth weight and arterial cord pH pending.  INDICATIONS AND CONSENT:  The patient is a 27 year old, G1, P0, at 22 and 4/7 th weeks, who has a history of a DVT with pulmonary embolism. She is heterozygous for factor V Leiden deficiency.  She has been therapeutically anticoagulated throughout pregnancy on Lovenox.  She was recently switched to heparin.  Her last dose of heparin was 11:15 p.m. last evening.  She has had increasing prodromal labor.  Because of the increasing concern of anticoagulation in and around cesarean section for breech, she is brought to the operating room today.  Potential risks and complications have been reviewed preoperatively including but not limited to infection, organ damage, bleeding requiring transfusion of blood products with HIV and hepatitis acquisition, DVT, PE, pneumonia, postoperative bleeding, wound hematoma, seroma, return to the operating room.  All questions have been answered and consent is signed on the chart.  PROCEDURE:  The patient was taken to the operating room, where she was identified.  Spinal anesthetic was placed per Dr. Jean Rosenthal, and she was placed in a  dorsal supine position with a 15 degree left lateral wedge. She has a Foley catheter placed and was prepped and draped per the [facility] protocol.  Time-out was undertaken.  After testing for adequate spinal anesthesia, skin was entered through a Pfannenstiel incision and dissection was carried out in layers to the peritoneum. The peritoneum was incised and extended superiorly and inferiorly. Vesicouterine peritoneum was taken down cephalad laterally.  The bladder flap was developed.  The bladder blade was placed.  The uterus was incised in a low transverse manner.  The uterine cavity was entered bluntly with a hemostat.  Clear fluid was noted.  The uterine incision was extended cephalolaterally with fingers.  The baby was in the double footling breech position and was delivered without difficulty.  Good cry and tone was noted.  Cord was clamped and cut.  The baby was handed to the awaiting pediatrics team.  Placenta was manually delivered and sent to pathology.  Uterus was exteriorized.  Uterus was closed in 2 running locking imbricating layers of 0 Monocryl suture.  Several sutures of 0 Monocryl and then of 2-0 chromic were used to obtain complete hemostasis.  The uterus was then returned to the abdominal cavity. Irrigation was carried out.  A bleeder from the prominent superficial vessel on the  lower uterine segment above the level of the bladder flap was then encountered.  This was ligated with 0 Vicryl suture and then the 2-0 chromic as well.  Observation revealed good hemostasis.  A section of Gelfoam was then placed over this and the bladder flap was tacked into place covering it with the 2-0 chromic.  Continued hemostasis was noted all around.  Tubes and ovaries were normal.  The anterior peritoneum was then closed in a running fashion with 0 Monocryl suture which was also used to reapproximate the pyramidalis muscle in midline.  Anterior rectus fascia was closed in running  fashion with a 0 looped PDS suture.  Skin was closed with clips.  Pressure dressing was applied.  All counts were correct.  The patient was awakened and taken to the recovery room in stable condition.     Guy Sandifer Henderson Cloud, M.D.     JET/MEDQ  D:  07/01/2013  T:  07/02/2013  Job:  [id number]

## 2013-07-02 NOTE — Lactation Note (Signed)
This note was copied from the chart of Robin Vaness Jelinski. Lactation Consultation Note  Patient Name: Robin Key [id number] Date: 07/02/2013 Reason for consult: Follow-up assessment;Difficult latch.  Baby has had some ability to latch to mom's large/soft breasts despite nipples which are very short and tend to flatten when breast compressed.  Baby has exclusively breastfed and is 31 hours of age with output above minimum and feedings up to 30 minutes but parents state that latching has become more difficult and mom has tender/irritated nipples and is applying lanolin which she brought from home.  LC suggested reverse pressure and/or shells and pre-pumping with hand pump prior to latch attempts.  Mom has some colostrum when expressed and LC assisted her to latch baby in cross-cradle hold on (R) with about 5 minutes of successful latch, using #24 NS.  Baby pulled off and was sleepy and mom reports a feeding about an hour ago.  Drops of thick colostrum seen inside NS.  LC reviewed use and cleaning of NS, use of shells and will ask RN to assist with hand pump later.  Mom c/o persistent pain and states she has not slept for 2 days and has poor pain control.  She gets OOB to BR while LC demonstrated suck training to FOB, explaining that this is better than a pacifier because if baby is not opening mouth wide or cupping her tongue over gumline, this will help her learn and can be used to calm and prior to latch, as well.   Maternal Data    Feeding Feeding Type: Breast Fed Length of feed: 5 min  LATCH Score/Interventions Latch: Repeated attempts needed to sustain latch, nipple held in mouth throughout feeding, stimulation needed to elicit sucking reflex. Intervention(s): Skin to skin;Teach feeding cues;Waking techniques (also demonstrated calming techniques, suck training) Intervention(s): Adjust position;Assist with latch;Breast compression  Audible Swallowing: A few with  stimulation Intervention(s): Skin to skin;Hand expression Intervention(s): Skin to skin;Hand expression;Alternate breast massage  Type of Nipple: Flat Intervention(s): Hand pump;Shells;Reverse pressure Intervention(s): Reverse pressure;Shells;Hand pump  Comfort (Breast/Nipple): Filling, red/small blisters or bruises, mild/mod discomfort  Problem noted: Mild/Moderate discomfort Interventions (Mild/moderate discomfort): Reverse pressue;Pre-pump if needed;Hand expression  Hold (Positioning): Assistance needed to correctly position infant at breast and maintain latch. Intervention(s): Breastfeeding basics reviewed;Support Pillows;Position options;Skin to skin  LATCH Score: 5  (LC assist using #24 NS - only brief latching and sucking achieved)  Lactation Tools Discussed/Used Tools: Shells;Nipple Dorris Carnes;Lanolin;Pump (mom brought from home) Nipple shield size: 24 Shell Type: Inverted   Consult Status Consult Status: Follow-up Date: 07/03/13 Follow-up type: In-patient    Warrick Parisian [facility] 07/02/2013, 8:58 PM

## 2013-07-02 NOTE — Progress Notes (Signed)
Subjective: Postpartum Day 1: Cesarean Delivery Patient reports incisional pain and tolerating PO.    Objective: Vital signs in last 24 hours: Temp:  [97.3 F (36.3 C)-98.7 F (37.1 C)] 98.1 F (36.7 C) (11/26 0330) Pulse Rate:  [73-101] 92 (11/26 0735) Resp:  [17-20] 18 (11/26 0735) BP: (90-117)/(45-92) 106/67 mmHg (11/26 0735) SpO2:  [96 %-100 %] 98 % (11/26 0735) Weight:  [246 lb (111.585 kg)] 246 lb (111.585 kg) (11/25 1730)  Physical Exam:  General: alert, cooperative and fatigued Lochia: appropriate Uterine Fundus: firm Incision: abd dressing CDI DVT Evaluation: No evidence of DVT seen on physical exam. Negative Homan's sign. No cords or calf tenderness. No significant calf/ankle edema.   Recent Labs  06/30/13 1420 07/02/13 0550  HGB 10.8* 8.8*  HCT 33.2* 26.9*    Assessment/Plan: Status post Cesarean section. Postoperative course complicated by post operative pain. H/o pulmonary embolus  See order pain order change.  Robin Key 07/02/2013, 8:46 AM

## 2013-07-03 MED ORDER — HYDROCODONE-ACETAMINOPHEN 5-325 MG PO TABS: 1.0000 | ORAL_TABLET | ORAL | Status: DC | PRN

## 2013-07-03 MED ORDER — FERROUS SULFATE 325 (65 FE) MG PO TABS: 325.0000 mg | ORAL_TABLET | Freq: Two times a day (BID) | ORAL | Status: DC

## 2013-07-03 NOTE — Lactation Note (Signed)
This note was copied from the chart of Robin Terrika Zuver. Lactation Consultation Note  Patient Name: Robin Key Date: 07/03/2013 Reason for consult: Follow-up assessment Visited mom X 2 today , 1st visit observed latch and worked on positioning  And depth at the breast. #24 Nipple shield fits well and makes the  Nipple more erect , Mom able to achieve depth ,  milk noted in the NS when baby finished. Prior to [facility] leaving room , mom relatched with depth Baby end up feeding total time of 35 mins.   areola still semi compress able , per mom is improving. Mom if aware of the importance to wear the shells. Reviewed basics with mom - breast massage , hand express, ( which mom does well , several large drops noted ), Pre - pump if needed to make the nipple and areola more elastic when the milk comes in . Reverse pressure exercise  ( shown to mom ) to make the tissue more elastic. Reviewed sore nipple and engorgement prevention and tx.  LC recommended and offered mom an LC O/P F/U due to swelling and use of a Nipple shield , per mom can't I go to an LC near where I live? Mom lives in [place] , [facility] [place], [facility] , [place] reccommended calling the [facility] department at [facility]  Tomorrow and checking for a F/U by next week if unable to arrange it, to call [facility] office at [facility] back and arrange a F/U LC O/P apt.    LC stressed the importance of F/U apt due to use of a Nipple shield and need for weekly weight checks while using the Nipple shield. Mom in  agreement to recommendation. Per mom will be going for a weight check tomorrow to [facility]. Mom rented a SYMPHONY DEBP  With instructions and set up. Mom aware of the [facility] Services at [facility] . Stressed the importance of keeping feeding diary for at least 1 week , extra sheets given.    Maternal Data    Feeding Feeding Type: Breast Fed (#24 nipple shield fits well ) Length of feed: 35 min (LC observed 15 mins of  the feeeding and baby relatched , milk noted in the NS )  LATCH Score/Interventions Latch: Grasps breast easily, tongue down, lips flanged, rhythmical sucking. (latched and re-latched, worked on depth ) Intervention(s): Skin to skin;Teach feeding cues;Waking techniques Intervention(s): Adjust position;Assist with latch;Breast massage;Breast compression  Audible Swallowing: Spontaneous and intermittent (increased with breast compressions )  Type of Nipple: Everted at rest and after stimulation  Comfort (Breast/Nipple): Soft / non-tender (per mom comfortable )     Hold (Positioning): Assistance needed to correctly position infant at breast and maintain latch. (worked on depth ) Intervention(s): Breastfeeding basics reviewed;Support Pillows;Position options;Skin to skin  LATCH Score: 9  Lactation Tools Discussed/Used Tools: Shells;Nipple Dorris Carnes;Pump Nipple shield size: 24 Shell Type: Inverted Breast pump type: Double-Electric Breast Pump (rented a DEBP Symphony until her DEBP comes in ) [facility] Program: No Pump Review: Setup, frequency, and cleaning;Milk Storage Initiated by:: MAI  Date initiated:: 07/03/13   Consult Status Consult Status: Complete (offered mom a LC O/P apt , see LC note )    Kathrin Greathouse 07/03/2013, 2:09 PM

## 2013-07-03 NOTE — Discharge Summary (Signed)
Obstetric Discharge Summary Reason for Admission: cesarean section Prenatal Procedures: none Intrapartum Procedures: cesarean: low cervical, transverse Postpartum Procedures: none Complications-Operative and Postpartum: none Hemoglobin  Date Value Range Status  07/02/2013 8.8* 12.0 - 15.0 g/dL Final     DELTA CHECK NOTED     REPEATED TO VERIFY     HCT  Date Value Range Status  07/02/2013 26.9* 36.0 - 46.0 % Final    Physical Exam:  General: alert, cooperative and appears stated age Lochia: appropriate Uterine Fundus: firm Incision: healing well, no significant drainage DVT Evaluation: No evidence of DVT seen on physical exam. Negative Homan's sign. No cords or calf tenderness.  Discharge Diagnoses: Term Pregnancy-delivered  Discharge Information: Date: 07/03/2013 Activity: pelvic rest Diet: routine Medications: Ibuprofen and Vicodin Condition: stable Instructions: refer to practice specific booklet Discharge to: home   Newborn Data: Live born female  Birth Weight: 8 lb 4.5 oz (3755 g) APGAR: 9, 9  Home with mother.  Robin Key 07/03/2013, 9:51 AM

## 2013-07-03 NOTE — Progress Notes (Signed)
Subjective: Postpartum Day 2: Cesarean Delivery Patient reports tolerating PO and no problems voiding.  Patient requests early discharge home today.    Objective: Vital signs in last 24 hours: Temp:  [97 F (36.1 C)-98.7 F (37.1 C)] 98.7 F (37.1 C) (11/27 0634) Pulse Rate:  [75-91] 91 (11/27 0634) Resp:  [18] 18 (11/27 0634) BP: (112-119)/(60-71) 112/60 mmHg (11/27 0634) SpO2:  [97 %-98 %] 97 % (11/26 1530)  Physical Exam:  General: alert, cooperative and appears stated age Lochia: appropriate Uterine Fundus: firm Incision: healing well, no significant drainage.  Honeycomb dressing in place. DVT Evaluation: No evidence of DVT seen on physical exam. Negative Homan's sign. No cords or calf tenderness.   Recent Labs  06/30/13 1420 07/02/13 0550  HGB 10.8* 8.8*  HCT 33.2* 26.9*    Assessment/Plan: Status post Cesarean section. Doing well postoperatively.  Discharge home with standard precautions and return to clinic in 4-6 weeks. H/O DVT.  Has LMWH at home and will continue x 6 weeks postpartum.  Narcisa Ganesh 07/03/2013, 9:42 AM

## 2013-07-04 ENCOUNTER — Encounter (HOSPITAL_COMMUNITY)
Admission: RE | Admit: 2013-07-04 | Discharge: 2013-07-04 | Disposition: A | Discharge status: Home / Self Care | Payer: BC Managed Care – PPO | Source: Ambulatory Visit | Attending: Obstetrics and Gynecology | Admitting: Obstetrics and Gynecology

## 2013-07-04 DIAGNOSIS — O923 Agalactia: Secondary | ICD-10-CM | POA: Insufficient documentation

## 2013-07-16 ENCOUNTER — Telehealth: Payer: Self-pay | Admitting: Oncology

## 2013-07-16 NOTE — Telephone Encounter (Signed)
pt called requested a 3 week post delivery follow up appt w Dr Clelia Croft pt stated Dr Clelia Croft called her and told her to follow up Appt made shh

## 2013-07-25 ENCOUNTER — Ambulatory Visit (HOSPITAL_BASED_OUTPATIENT_CLINIC_OR_DEPARTMENT_OTHER): Payer: BC Managed Care – PPO | Admitting: Oncology

## 2013-07-25 ENCOUNTER — Encounter (INDEPENDENT_AMBULATORY_CARE_PROVIDER_SITE_OTHER): Payer: Self-pay

## 2013-07-25 VITALS — BP 111/58 | HR 79 | Temp 98.3°F | Resp 18 | Ht 67.0 in | Wt 224.9 lb

## 2013-07-25 DIAGNOSIS — I8290 Acute embolism and thrombosis of unspecified vein: Secondary | ICD-10-CM

## 2013-07-25 DIAGNOSIS — Z7901 Long term (current) use of anticoagulants: Secondary | ICD-10-CM

## 2013-07-25 DIAGNOSIS — I2699 Other pulmonary embolism without acute cor pulmonale: Secondary | ICD-10-CM

## 2013-07-25 NOTE — Progress Notes (Signed)
Hematology and Oncology Follow Up Visit  Robin Key [id number] 1986/06/19 27 y.o. 07/25/2013 3:28 PM Robin Key, Orie Rout, NP   Principle Diagnosis: 27 year old woman with history of pulmonary embolism in the setting of a heterozygous factor V Leiden mutation. This was diagnosed in 2012.    Prior Therapy: She is status post full dose anticoagulation with Xarelto.   Current therapy: Full dose anticoagulation with Lovenox after she had a C-section on 07/01/2013.  Interim History:  [name] presents today for a followup visit. She is a pleasant woman that I saw in consultation for the management of her anticoagulation during pregnancy. Throughout her pregnancy, she was anticoagulated with full dose Lovenox upper 150 mg subcutaneously daily. Leading up to her delivery, she was switched to subcutaneous heparin therapeutic doses due to its short half-life especially in the setting of a possible C-section. She underwent that the C-section without any complication on 07/01/2013 and presents today for a followup visit. She has not reported any complications of bleeding or thrombosis at this time. She is fully recovered and have continued Lovenox post partum without any problems.  Medications: I have reviewed the patient's current medications.  Current Outpatient Prescriptions  Medication Sig Dispense Refill  . acetaminophen (TYLENOL) 500 MG tablet Take 1,000 mg by mouth every 6 (six) hours as needed for pain.      . butalbital-acetaminophen-caffeine (FIORICET, ESGIC) 50-325-40 MG per tablet Take 2 tablets by mouth every 6 (six) hours as needed for headache.       . cetirizine (ZYRTEC) 10 MG tablet Take 10 mg by mouth daily.      . cyclobenzaprine (FLEXERIL) 10 MG tablet Take 0.5-1 tablets (5-10 mg total) by mouth 3 (three) times daily as needed for muscle spasms.  30 tablet  0  . ferrous sulfate 325 (65 FE) MG tablet Take 1 tablet (325 mg total) by mouth 2 (two) times daily with a meal.   60 tablet  3  . HYDROcodone-acetaminophen (NORCO/VICODIN) 5-325 MG per tablet Take 1-2 tablets by mouth every 4 (four) hours as needed for moderate pain.  40 tablet  0  . Simethicone (GAS-X PO) Take 2 tablets by mouth daily as needed (gas pain).       No current facility-administered medications for this visit.     Allergies:  Allergies  Allergen Reactions  . Bactrim [Sulfamethoxazole-Trimethoprim] Other (See Comments)    Headache   . Ciprofloxacin Other (See Comments)    Felt like "bugs crawling" when on Lovenox  . Percocet [Oxycodone-Acetaminophen] Nausea And Vomiting  . Latex Rash  . Penicillins Rash    Past Medical History, Surgical history, Social history, and Family History were reviewed and updated.  Review of Systems:  Remaining ROS negative. Physical Exam: Blood pressure 111/58, pulse 79, temperature 98.3 F (36.8 C), temperature source Oral, resp. rate 18, height 5\' 7"  (1.702 m), weight 224 lb 14.4 oz (102.014 kg), unknown if currently breastfeeding. ECOG: ECOG 0 General appearance: alert, cooperative and appears stated age Head: Normocephalic, without obvious abnormality, atraumatic Neck: no adenopathy, no carotid bruit, no JVD, supple, symmetrical, trachea midline and thyroid not enlarged, symmetric, no tenderness/mass/nodules Lymph nodes: Cervical, supraclavicular, and axillary nodes normal. Heart:regular rate and rhythm, S1, S2 normal, no murmur, click, rub or gallop Lung:chest clear, no wheezing, rales, normal symmetric air entry Abdomin: soft, non-tender, without masses or organomegaly EXT:no erythema, induration, or nodules   Lab Results: Lab Results  Component Value Date   WBC 14.6* 07/02/2013  HGB 8.8* 07/02/2013   HCT 26.9* 07/02/2013   MCV 74.3* 07/02/2013   PLT 231 07/02/2013     Chemistry      Component Value Date/Time   NA 133* 07/01/2013 1115   K 3.5 07/01/2013 1115   CL 99 07/01/2013 1115   CO2 22 07/01/2013 1115   BUN 4* 07/01/2013  1115   CREATININE 0.57 07/01/2013 1115      Component Value Date/Time   CALCIUM 9.1 07/01/2013 1115   ALKPHOS 194* 07/01/2013 1115   AST 11 07/01/2013 1115   ALT 7 07/01/2013 1115   BILITOT 0.5 07/01/2013 1115       Impression and Plan:  27 year old woman with a history of pulmonary embolism in the setting of a factor V Leiden heterozygous mutation. She successfully went through pregnancy and is C-section delivery that was covered with Lovenox and subcutaneous heparin and currently postpartum on Lovenox only. I've instructed her to stop the Lovenox after 4 weeks of her C-section but if she was to be extra cautious extending to 12 weeks would be reasonable as well. But beyond that there is no need for full dose anticoagulation. She does if she is to get pregnant again she'll need prophylaxis which will be happy to assist in that in the future.  [facility], MD 12/19/20143:28 PM

## 2013-08-04 ENCOUNTER — Encounter (HOSPITAL_COMMUNITY)
Admission: RE | Admit: 2013-08-04 | Discharge: 2013-08-04 | Disposition: A | Discharge status: Home / Self Care | Payer: BC Managed Care – PPO | Source: Ambulatory Visit | Attending: Obstetrics and Gynecology | Admitting: Obstetrics and Gynecology

## 2013-08-04 DIAGNOSIS — O923 Agalactia: Secondary | ICD-10-CM | POA: Insufficient documentation

## 2013-08-22 ENCOUNTER — Telehealth: Payer: Self-pay | Admitting: *Deleted

## 2013-08-22 ENCOUNTER — Other Ambulatory Visit: Payer: Self-pay | Admitting: *Deleted

## 2013-08-22 MED ORDER — ENOXAPARIN SODIUM 150 MG/ML ~~LOC~~ SOLN: 150.0000 mg | SUBCUTANEOUS | Status: DC

## 2013-08-22 NOTE — Telephone Encounter (Signed)
Patient calling to say she was seen in the E.D. In [place] last night. C/o pain, redness, swelling left leg below the knee. Doppler was done and she has superficial blood clots. Per dr Alen Blew, she is to go back on lovenox, 150 mg [place] daily x 4 weeks and call us when she has finished. lovenox called to patient's pharmacy. [facility] [place].

## 2013-08-22 NOTE — Telephone Encounter (Signed)
Patient reported to  E.D last night in [place] c/o pain, redness, and swelling, left leg BTK, and hot to the touch. Doppler done. Shows superficial blood clots. Doppler report faxed to [place]. Per dr Alen Blew, go back on lovenox 150 mg q d x 1 month. Patient has 1 weeks worth. 3 weeks worth called to patient's pharmacy. [facility] [place] [phone]. Patient notified.

## 2013-08-25 ENCOUNTER — Ambulatory Visit (HOSPITAL_BASED_OUTPATIENT_CLINIC_OR_DEPARTMENT_OTHER): Payer: BC Managed Care – PPO | Admitting: Hematology and Oncology

## 2013-08-25 ENCOUNTER — Other Ambulatory Visit: Payer: Self-pay | Admitting: *Deleted

## 2013-08-25 ENCOUNTER — Ambulatory Visit (HOSPITAL_COMMUNITY)
Admission: RE | Admit: 2013-08-25 | Discharge: 2013-08-25 | Disposition: A | Discharge status: Home / Self Care | Payer: BC Managed Care – PPO | Source: Ambulatory Visit | Attending: Family | Admitting: Family

## 2013-08-25 ENCOUNTER — Telehealth: Payer: Self-pay | Admitting: Hematology and Oncology

## 2013-08-25 VITALS — BP 132/78 | HR 77 | Temp 97.8°F | Resp 18 | Ht 67.0 in | Wt 231.1 lb

## 2013-08-25 DIAGNOSIS — Z86711 Personal history of pulmonary embolism: Secondary | ICD-10-CM

## 2013-08-25 DIAGNOSIS — M7989 Other specified soft tissue disorders: Secondary | ICD-10-CM

## 2013-08-25 DIAGNOSIS — M79606 Pain in leg, unspecified: Secondary | ICD-10-CM

## 2013-08-25 DIAGNOSIS — D6859 Other primary thrombophilia: Secondary | ICD-10-CM | POA: Insufficient documentation

## 2013-08-25 DIAGNOSIS — L539 Erythematous condition, unspecified: Secondary | ICD-10-CM

## 2013-08-25 DIAGNOSIS — M79609 Pain in unspecified limb: Secondary | ICD-10-CM

## 2013-08-25 DIAGNOSIS — I8000 Phlebitis and thrombophlebitis of superficial vessels of unspecified lower extremity: Secondary | ICD-10-CM

## 2013-08-25 NOTE — Telephone Encounter (Signed)
, °

## 2013-08-25 NOTE — Progress Notes (Signed)
Left lower extremity venous duplex completed.  Left:  No evidence of DVT or Baker's cyst.  There appears to be superficial thrombus in a couple of varicose veins in the left calf.  Right:  Negative for DVT in the common femoral vein.

## 2013-08-25 NOTE — Progress Notes (Signed)
Hematology and Oncology Follow Up Visit  Robin Key [id number] 1985-10-05 27 y.o. 08/25/2013 5:00 PM [name], [name], NP   Principle Diagnosis: 28 year old woman with history of pulmonary embolism in the setting of a heterozygous factor V Leiden mutation. This was diagnosed in 2012.    Prior Therapy: She is status post full dose anticoagulation with Xarelto.   Current therapy: Full dose anticoagulation with Lovenox after she was found to have superficial thrombophlebitis on 08/22/2013  Interim History:  Ms. Mione is here for followup visit today. Since the time of last visit she complains of chest pain went to the ER on 08/20/2013 and had CT of the chest done and that was negative for any  pulmonary embolism. On 08/22/2013 she noticed a left lower extremity  Swelling, knot,  pain below the knee associated with warmth and erythema. She had venous Doppler of left lower she'll be performed on 08/22/2013 and that revealed negative for DVT but positive for superficial thrombophlebitis in the greater saphenous vein below the knee. Her anticoagulation was a discontinued on December 29,2014 and was restarted on 08/22/2013 once she was found to have superficial thrombophlebitis. Even though she is taking 150 mg of Lovenox subcutaneously once daily, she noticed increase in left lower the swelling associated worsening erythema and she feels left lower extremity is more warmer than before  Medications: I have reviewed the patient's current medications.  Current Outpatient Prescriptions  Medication Sig Dispense Refill  . acetaminophen (TYLENOL) 500 MG tablet Take 1,000 mg by mouth every 6 (six) hours as needed for pain.      . butalbital-acetaminophen-caffeine (FIORICET, ESGIC) 50-325-40 MG per tablet Take 2 tablets by mouth every 6 (six) hours as needed for headache.       . cetirizine (ZYRTEC) 10 MG tablet Take 10 mg by mouth daily.      Marland Kitchen enoxaparin (LOVENOX) 150 MG/ML injection  Inject 1 mL (150 mg total) into the skin daily.  21 Syringe  0  . ferrous sulfate 325 (65 FE) MG tablet Take 1 tablet (325 mg total) by mouth 2 (two) times daily with a meal.  60 tablet  3   No current facility-administered medications for this visit.     Allergies:  Allergies  Allergen Reactions  . Bactrim [Sulfamethoxazole-Trimethoprim] Other (See Comments)    Headache   . Ciprofloxacin Other (See Comments)    Felt like "bugs crawling" when on Lovenox  . Percocet [Oxycodone-Acetaminophen] Nausea And Vomiting  . Latex Rash  . Penicillins Rash    Past Medical History, Surgical history, Social history, and Family History were reviewed and updated.  Review of Systems:  Remaining ROS negative. Physical Exam: Blood pressure 132/78, pulse 77, temperature 97.8 F (36.6 C), temperature source Oral, resp. rate 18, height 5\' 7"  (1.702 m), weight 231 lb 1.6 oz (104.826 kg), unknown if currently breastfeeding. ECOG: ECOG 0 General appearance: alert, cooperative and appears stated age Head: Normocephalic, without obvious abnormality, atraumatic Neck: no adenopathy, no carotid bruit, no JVD, supple, symmetrical, trachea midline and thyroid not enlarged, symmetric, no tenderness/mass/nodules Lymph nodes: Cervical, supraclavicular, and axillary nodes normal. Heart:regular rate and rhythm, S1, S2 normal, no murmur, click, rub or gallop Lung:chest clear, no wheezing, rales, normal symmetric air entry Abdomin: soft, non-tender, without masses or organomegaly EXT: Left lower extremity is swollen than the right lower extremity associated with firm tender nodule appreciated just below the left popliteal fossa   Lab Results: Lab Results  Component Value Date  WBC 14.6* 07/02/2013   HGB 8.8* 07/02/2013   HCT 26.9* 07/02/2013   MCV 74.3* 07/02/2013   PLT 231 07/02/2013     Chemistry      Component Value Date/Time   NA 133* 07/01/2013 1115   K 3.5 07/01/2013 1115   CL 99 07/01/2013 1115    CO2 22 07/01/2013 1115   BUN 4* 07/01/2013 1115   CREATININE 0.57 07/01/2013 1115      Component Value Date/Time   CALCIUM 9.1 07/01/2013 1115   ALKPHOS 194* 07/01/2013 1115   AST 11 07/01/2013 1115   ALT 7 07/01/2013 1115   BILITOT 0.5 07/01/2013 1115       Impression and Plan:  54. 27 year old woman with a history of pulmonary embolism in the setting of a factor V Leiden heterozygous mutation. She successfully went through pregnancy and is C-section delivery on 07/01/2013 that was covered with Lovenox and subcutaneous heparin initially followed by Lovenox up until 08/04/2013 and was discontinued.  2. Developed Left lower extremity swelling associated with pain, erythema and warmth and the venous Doppler of the left lower extremity performed on 08/22/2013 revealed superficial thrombphlebitis in the greater saphenous vein below the knee: Restartred on Lovenox at 150 mg subcutaneously daily from 08/22/2013. We will arrange for the repeat venous Doppler of the left lower extremity in view of worsening swelling to assess for progression of the thrombosis. If  repeat venous Doppler is negative for DVT then after 1 week of Lovenox, I have instructed the patient to start waring above knee compression stockings. If she is positive for DVT, I have asked the patient not to wear compression stockings and change Lovenox dosing to  100 mg [place] every 12 hours.  Follow up visit in 2 weeks  Wilmon Arms, MD Medical Oncology/Hematology 1/19/20155:00 PM

## 2013-08-26 ENCOUNTER — Telehealth: Payer: Self-pay | Admitting: *Deleted

## 2013-08-26 NOTE — Telephone Encounter (Signed)
Per Dr. Harriet Butte request. Called pt to inform her that Doppler test was negative for DVT. Pt verbalized understanding. Message forwarded to Dr. Harriet Butte.

## 2013-09-04 ENCOUNTER — Encounter (HOSPITAL_COMMUNITY)
Admission: RE | Admit: 2013-09-04 | Discharge: 2013-09-04 | Disposition: A | Discharge status: Home / Self Care | Payer: BC Managed Care – PPO | Source: Ambulatory Visit | Attending: Obstetrics and Gynecology | Admitting: Obstetrics and Gynecology

## 2013-09-04 DIAGNOSIS — O923 Agalactia: Secondary | ICD-10-CM | POA: Insufficient documentation

## 2013-09-08 ENCOUNTER — Telehealth: Payer: Self-pay | Admitting: *Deleted

## 2013-09-08 ENCOUNTER — Ambulatory Visit (HOSPITAL_BASED_OUTPATIENT_CLINIC_OR_DEPARTMENT_OTHER): Payer: BC Managed Care – PPO | Admitting: Hematology and Oncology

## 2013-09-08 VITALS — BP 119/80 | HR 89 | Temp 98.5°F | Resp 18 | Ht 67.0 in | Wt 229.4 lb

## 2013-09-08 DIAGNOSIS — Z86718 Personal history of other venous thrombosis and embolism: Secondary | ICD-10-CM | POA: Insufficient documentation

## 2013-09-08 DIAGNOSIS — M7989 Other specified soft tissue disorders: Secondary | ICD-10-CM

## 2013-09-08 DIAGNOSIS — I8390 Asymptomatic varicose veins of unspecified lower extremity: Secondary | ICD-10-CM

## 2013-09-08 DIAGNOSIS — I8000 Phlebitis and thrombophlebitis of superficial vessels of unspecified lower extremity: Secondary | ICD-10-CM

## 2013-09-08 DIAGNOSIS — M79609 Pain in unspecified limb: Secondary | ICD-10-CM

## 2013-09-08 DIAGNOSIS — I82890 Acute embolism and thrombosis of other specified veins: Secondary | ICD-10-CM

## 2013-09-08 DIAGNOSIS — D649 Anemia, unspecified: Secondary | ICD-10-CM | POA: Insufficient documentation

## 2013-09-08 MED ORDER — ENOXAPARIN SODIUM 150 MG/ML ~~LOC~~ SOLN: 150.0000 mg | SUBCUTANEOUS | Status: DC

## 2013-09-08 NOTE — Telephone Encounter (Signed)
appts made and printed. Pt is aware that i sw Robin Key @ Dr. Donnetta Hutching office. [name] stated that the pt will be called tomorrow w/ an appt...td

## 2013-09-08 NOTE — Progress Notes (Signed)
Hematology and Oncology Follow Up Visit  Robin Key [id number] 08/20/1985 28 y.o. 09/08/2013 4:26 PM [name], [name], NP   Principle Diagnosis: 28 year old woman with history of pulmonary embolism in the setting of a heterozygous factor V Leiden mutation. This was diagnosed in 2012.    Current therapy: Full dose anticoagulation with Lovenox after she was found to have superficial thrombophlebitis on 08/22/2013  Interval History:  Robin Key is here for followup visit today. She is on Lovenox 150 mg [place] of daily since 08/22/2013 for  superficial thrombophlebitis in the greater saphenous vein below the knee. In view of her worsening left lower extremity swelling  repeat venous Doppler was performed in 08/25/2013 and that revealed no evidence of deep vein thrombosis and Superficial thrombosis was noted in couple of varicose veins in the left calf. she says her left lower extremity pain and swelling is slightly better. She denies any shortness of breath, chest pains or palpitations. She denies any nosebleed gum bleed blood in the stool blood in the urine. She says that she's been having varicose veins since the age of 65 years. She recently gained 8 pounds of weight. She is tolerating Lovenox well without any major side effects.  She complains of tiredness. she supposed to take iron sulfate for her anemia but she's not taking in view of iron sulfate causing baby constipated, since she  is breast-feeding     Medications: I have reviewed the patient's current medications.  Current Outpatient Prescriptions  Medication Sig Dispense Refill  . cetirizine (ZYRTEC) 10 MG tablet Take 10 mg by mouth daily.      Marland Kitchen enoxaparin (LOVENOX) 150 MG/ML injection Inject 1 mL (150 mg total) into the skin daily. 150 mg [place] daily for 6 weeks. If any evidence bleeding, she should stop lovenox and go to nearest ER  21 Syringe  1  . acetaminophen (TYLENOL) 500 MG tablet Take 1,000 mg by mouth every 6 (six)  hours as needed for pain.      . butalbital-acetaminophen-caffeine (FIORICET, ESGIC) 50-325-40 MG per tablet Take 2 tablets by mouth every 6 (six) hours as needed for headache.       . ferrous sulfate 325 (65 FE) MG tablet Take 1 tablet (325 mg total) by mouth 2 (two) times daily with a meal.  60 tablet  3   No current facility-administered medications for this visit.     Allergies:  Allergies  Allergen Reactions  . Bactrim [Sulfamethoxazole-Trimethoprim] Other (See Comments)    Headache   . Ciprofloxacin Other (See Comments)    Felt like "bugs crawling" when on Lovenox  . Percocet [Oxycodone-Acetaminophen] Nausea And Vomiting  . Latex Rash  . Penicillins Rash    Past Medical History, Surgical history, Social history, and Family History were reviewed and updated.  Review of Systems: A 14 point of review of systems is been assessed and pertinent findings are mentioned in interval history  Physical Exam: Blood pressure 119/80, pulse 89, temperature 98.5 F (36.9 C), temperature source Oral, resp. rate 18, height 5\' 7"  (1.702 m), weight 229 lb 6.4 oz (104.055 kg), unknown if currently breastfeeding. ECOG: ECOG 0 General appearance: alert, cooperative and appears stated age Head: Normocephalic, without obvious abnormality, atraumatic Neck: no adenopathy, no carotid bruit, no JVD, supple, symmetrical, trachea midline and thyroid not enlarged, symmetric, no tenderness/mass/nodules Lymph nodes: Cervical, supraclavicular, and axillary nodes normal. Heart:regular rate and rhythm, S1, S2 normal, no murmur, click, rub or gallop Lung:chest clear, no wheezing,  rales, normal symmetric air entry Abdomin: soft, non-tender, without masses or organomegaly EXT: Left lower extremity is swollen than the right lower extremity associated with firm tender nodule appreciated just below the left popliteal fossa. Swelling is slightly better than before  Lab Results: Lab Results  Component Value Date    WBC 14.6* 07/02/2013   HGB 8.8* 07/02/2013   HCT 26.9* 07/02/2013   MCV 74.3* 07/02/2013   PLT 231 07/02/2013     Chemistry      Component Value Date/Time   NA 133* 07/01/2013 1115   K 3.5 07/01/2013 1115   CL 99 07/01/2013 1115   CO2 22 07/01/2013 1115   BUN 4* 07/01/2013 1115   CREATININE 0.57 07/01/2013 1115      Component Value Date/Time   CALCIUM 9.1 07/01/2013 1115   ALKPHOS 194* 07/01/2013 1115   AST 11 07/01/2013 1115   ALT 7 07/01/2013 1115   BILITOT 0.5 07/01/2013 1115       Impression and Plan:  83. 28 year old woman with a history of pulmonary embolism in the setting of a factor V Leiden heterozygous mutation. She successfully went through pregnancy and is C-section delivery on 07/01/2013 that was covered with Lovenox and subcutaneous heparin initially followed by Lovenox up until 08/04/2013 and was discontinued.  2. She developed chest pain went to the ER on 08/20/2013 and had CT of the chest done and that was negative for any  pulmonary embolism.  3. Developed Left lower extremity swelling associated with pain, erythema and warmth and the venous Doppler of the left lower extremity performed on 08/22/2013 revealed superficial thrombphlebitis in the greater saphenous vein below the knee: Restartred on Lovenox at 150 mg subcutaneously daily from 08/22/2013.   4. Repeat venous Doppler of the left lower extremity  performed in 08/25/2013 in view of worsening swelling to assess for progression of the thrombosis revealed no evidence of deep vein thrombosis and Superficial thrombosis was noted in couple of varicose veins in the left calf.  5. Bilateral lower extremity varicose veins left more than the right with symptoms: Patient has been referred to Dr. Clare Gandy Early for further evaluation and management of varicose veins  6. Since patient is symptomatic with left lower extremity pain and swelling, will continue Lovenox  at least for 6 weeks, while awaiting  recommendations  from vein specialist. I have instructed the patient   to stop taking Lovenox and go to the nearest ER, if any evidence of bleeding.  7. Anemia-I will order CBC and differential, CMP and iron panel today  8. Robin Key says she was borderline low protein C levels in the past. We'll also repeat the protein C level today  Follow up in 6 weeks with Dr. Alen Blew   Robin Key is in agreement with the current plan of care. She knows to call us if any problems or concerns  Total time spent: 30 minutes   Wilmon Arms, MD Medical Oncology/Hematology 2/2/20154:26 PM

## 2013-09-09 ENCOUNTER — Telehealth: Payer: Self-pay

## 2013-09-09 ENCOUNTER — Other Ambulatory Visit: Payer: Self-pay | Admitting: Hematology and Oncology

## 2013-09-09 ENCOUNTER — Other Ambulatory Visit (HOSPITAL_BASED_OUTPATIENT_CLINIC_OR_DEPARTMENT_OTHER): Payer: BC Managed Care – PPO

## 2013-09-09 DIAGNOSIS — D649 Anemia, unspecified: Secondary | ICD-10-CM

## 2013-09-09 DIAGNOSIS — Z86718 Personal history of other venous thrombosis and embolism: Secondary | ICD-10-CM

## 2013-09-09 LAB — CBC WITH DIFFERENTIAL/PLATELET
BASO%: 0.7 % (ref 0.0–2.0)
Basophils Absolute: 0 10*3/uL (ref 0.0–0.1)
EOS ABS: 0.1 10*3/uL (ref 0.0–0.5)
EOS%: 2 % (ref 0.0–7.0)
HCT: 34 % — ABNORMAL LOW (ref 34.8–46.6)
HEMOGLOBIN: 10.6 g/dL — AB (ref 11.6–15.9)
LYMPH#: 1.6 10*3/uL (ref 0.9–3.3)
LYMPH%: 26.2 % (ref 14.0–49.7)
MCH: 22.9 pg — ABNORMAL LOW (ref 25.1–34.0)
MCHC: 31.3 g/dL — ABNORMAL LOW (ref 31.5–36.0)
MCV: 73.1 fL — AB (ref 79.5–101.0)
MONO#: 0.5 10*3/uL (ref 0.1–0.9)
MONO%: 7.5 % (ref 0.0–14.0)
NEUT#: 4 10*3/uL (ref 1.5–6.5)
NEUT%: 63.6 % (ref 38.4–76.8)
Platelets: 287 10*3/uL (ref 145–400)
RBC: 4.65 10*6/uL (ref 3.70–5.45)
RDW: 16.8 % — ABNORMAL HIGH (ref 11.2–14.5)
WBC: 6.2 10*3/uL (ref 3.9–10.3)

## 2013-09-09 LAB — COMPREHENSIVE METABOLIC PANEL (CC13)
ALT: 58 U/L — ABNORMAL HIGH (ref 0–55)
ANION GAP: 8 meq/L (ref 3–11)
AST: 29 U/L (ref 5–34)
Albumin: 3.7 g/dL (ref 3.5–5.0)
Alkaline Phosphatase: 116 U/L (ref 40–150)
BUN: 10.4 mg/dL (ref 7.0–26.0)
CALCIUM: 9.8 mg/dL (ref 8.4–10.4)
CHLORIDE: 104 meq/L (ref 98–109)
CO2: 25 meq/L (ref 22–29)
Creatinine: 0.7 mg/dL (ref 0.6–1.1)
Glucose: 92 mg/dl (ref 70–140)
Potassium: 3.8 mEq/L (ref 3.5–5.1)
SODIUM: 138 meq/L (ref 136–145)
Total Bilirubin: 0.38 mg/dL (ref 0.20–1.20)
Total Protein: 7 g/dL (ref 6.4–8.3)

## 2013-09-09 LAB — IRON AND TIBC CHCC
%SAT: 3 % — ABNORMAL LOW (ref 21–57)
IRON: 15 ug/dL — AB (ref 41–142)
TIBC: 449 ug/dL — AB (ref 236–444)
UIBC: 434 ug/dL — ABNORMAL HIGH (ref 120–384)

## 2013-09-09 LAB — FERRITIN CHCC: FERRITIN: 7 ng/mL — AB (ref 9–269)

## 2013-09-09 NOTE — Addendum Note (Signed)
Addended by: Vassie Loll D on: 09/09/2013 03:16 PM   Modules accepted: Orders

## 2013-09-09 NOTE — Telephone Encounter (Signed)
Patient in office today for lab work.  Asked if she could have a Z-pack because she has started having some sinus pressure the past 2 days but forgot to talk to Dr Earnest Conroy about it yesterday.  Patient is currently breast feeding.  Dr Earnest Conroy felt it was best for her to call her OB or PCP for a prescription since she is currently breast feeding and has several antibiotic allergies.

## 2013-09-10 LAB — PROTEIN C, TOTAL: Protein C, Total: 81 % (ref 72–160)

## 2013-09-10 LAB — PROTEIN C ACTIVITY: Protein C Activity: 121 % (ref 75–133)

## 2013-09-26 ENCOUNTER — Other Ambulatory Visit: Payer: Self-pay | Admitting: *Deleted

## 2013-09-26 DIAGNOSIS — I83893 Varicose veins of bilateral lower extremities with other complications: Secondary | ICD-10-CM

## 2013-10-05 ENCOUNTER — Encounter (HOSPITAL_COMMUNITY)
Admission: RE | Admit: 2013-10-05 | Discharge: 2013-10-05 | Disposition: A | Discharge status: Home / Self Care | Payer: BC Managed Care – PPO | Source: Ambulatory Visit | Attending: Obstetrics and Gynecology | Admitting: Obstetrics and Gynecology

## 2013-10-05 DIAGNOSIS — O923 Agalactia: Secondary | ICD-10-CM | POA: Insufficient documentation

## 2013-10-07 ENCOUNTER — Telehealth: Payer: Self-pay | Admitting: Medical Oncology

## 2013-10-07 ENCOUNTER — Encounter: Payer: Self-pay | Admitting: Vascular Surgery

## 2013-10-07 NOTE — Telephone Encounter (Signed)
Patient called requesting to be changed from lovenox injections to xeralto. Reports to have several bruises along her abdomen from the lovenox injections. States she delivered her baby and is no longer breast feeding and reports she has had xeralto before. Also reports she cannot have coumadin d/t "difficulty stabilizing her INR and I cannot make lab appts three times a week."  Informed patient will give message to MD. Next sched appt 04/02 lab/MD  Patient with vein clinic appt 03/04.

## 2013-10-08 ENCOUNTER — Ambulatory Visit (HOSPITAL_COMMUNITY)
Admission: RE | Admit: 2013-10-08 | Discharge: 2013-10-08 | Disposition: A | Discharge status: Home / Self Care | Payer: BC Managed Care – PPO | Source: Ambulatory Visit | Attending: Vascular Surgery | Admitting: Vascular Surgery

## 2013-10-08 ENCOUNTER — Encounter: Payer: Self-pay | Admitting: Vascular Surgery

## 2013-10-08 ENCOUNTER — Ambulatory Visit (INDEPENDENT_AMBULATORY_CARE_PROVIDER_SITE_OTHER): Payer: BC Managed Care – PPO | Admitting: Vascular Surgery

## 2013-10-08 ENCOUNTER — Other Ambulatory Visit: Payer: Self-pay | Admitting: Medical Oncology

## 2013-10-08 VITALS — BP 121/77 | HR 79 | Ht 67.0 in | Wt 234.6 lb

## 2013-10-08 DIAGNOSIS — M7989 Other specified soft tissue disorders: Secondary | ICD-10-CM

## 2013-10-08 DIAGNOSIS — I83893 Varicose veins of bilateral lower extremities with other complications: Secondary | ICD-10-CM

## 2013-10-08 DIAGNOSIS — I8390 Asymptomatic varicose veins of unspecified lower extremity: Secondary | ICD-10-CM | POA: Insufficient documentation

## 2013-10-08 DIAGNOSIS — M79606 Pain in leg, unspecified: Secondary | ICD-10-CM | POA: Insufficient documentation

## 2013-10-08 DIAGNOSIS — M79609 Pain in unspecified limb: Secondary | ICD-10-CM

## 2013-10-08 MED ORDER — RIVAROXABAN 20 MG PO TABS: 20.0000 mg | ORAL_TABLET | Freq: Every day | ORAL | Status: DC

## 2013-10-08 MED ORDER — RIVAROXABAN 15 MG PO TABS: 15.0000 mg | ORAL_TABLET | Freq: Two times a day (BID) | ORAL | Status: DC

## 2013-10-08 NOTE — Telephone Encounter (Signed)
Per MD, prescription for Xarelto ordered to patient's requested pharmacy. Patient informed. Directions given to patient to take Xarelto 15 mg tablet by mouth two times daily for 21 days, then on 22nd day patient to start 20 mg tablet, by mouth, daily. Patient knows to stop lovenox injection on day she starts Xarelto. Patient states she will start Xarelto today and will not give herself Lovenox injection tonight. Encouraged her to call office with any questions or concerns, confirmed appt for 04/02 for labs and MD.

## 2013-10-08 NOTE — Progress Notes (Signed)
[facility]  Patient name: Robin Key MRN: [id number] DOB: 06-Jun-1986 Sex: female  REASON FOR CONSULT: Bilateral varicose vein  HPI: Robin Key is a 28 y.o. female with a long history of varicose veins. She states that she has had varicose veins that she was 28 years old. Her symptoms are more significant on the left side. She experiences aching pain and heaviness and throbbing that is associated with standing and sitting and relieved with elevation. She has had a previous DVT and also pulmonary embolus in November of 2012. She does not remember the details of this. She was found to have a factor V Leiden mutation. She has had no bleeding complications from her varicose veins. She has not worn compression stockings.   Past Medical History  Diagnosis Date  . Pulmonary embolism Nov 2012  . PCOS (polycystic ovarian syndrome)   . PONV (postoperative nausea and vomiting)   . Headache(784.0)   . Factor V Leiden   . Infection     UTI  . Blood dyscrasia     factor 5  . Varicose veins    Family History  Problem Relation Age of Onset  . Cancer Mother     skin  . Hyperlipidemia Mother   . Heart disease Father   . Miscarriages / Stillbirths Sister   . Diabetes Maternal Grandmother   . Cancer Maternal Grandmother     breast  . Osteoporosis Maternal Grandmother   . Glaucoma Maternal Grandmother   . Vision loss Maternal Grandmother   . Heart disease Maternal Grandfather   . Hypertension Maternal Grandfather   . Stroke Maternal Grandfather   . Dementia Paternal Grandmother   . Heart disease Paternal Grandfather    SOCIAL HISTORY: History  Substance Use Topics  . Smoking status: Never Smoker   . Smokeless tobacco: Never Used  . Alcohol Use: No   Allergies  Allergen Reactions  . Bactrim [Sulfamethoxazole-Trimethoprim] Other (See Comments)    Headache   . Ciprofloxacin Other (See Comments)    Felt like "bugs crawling" when on Lovenox  .  Percocet [Oxycodone-Acetaminophen] Nausea And Vomiting  . Latex Rash  . Penicillins Rash   Current Outpatient Prescriptions  Medication Sig Dispense Refill  . acetaminophen (TYLENOL) 500 MG tablet Take 1,000 mg by mouth every 6 (six) hours as needed for pain.      . butalbital-acetaminophen-caffeine (FIORICET, ESGIC) 50-325-40 MG per tablet Take 2 tablets by mouth every 6 (six) hours as needed for headache.       . cetirizine (ZYRTEC) 10 MG tablet Take 10 mg by mouth daily.      Marland Kitchen enoxaparin (LOVENOX) 150 MG/ML injection Inject 1 mL (150 mg total) into the skin daily. 150 mg [place] daily for 6 weeks. If any evidence bleeding, she should stop lovenox and go to nearest ER  21 Syringe  1  . ferrous sulfate 325 (65 FE) MG tablet Take 1 tablet (325 mg total) by mouth 2 (two) times daily with a meal.  60 tablet  3  . Rivaroxaban (XARELTO) 15 MG TABS tablet Take 1 tablet (15 mg total) by mouth 2 (two) times daily with a meal. For 21 days. Then start 20 mg prescription once daily.  42 tablet  0  . Rivaroxaban (XARELTO) 20 MG TABS tablet Take 1 tablet (20 mg total) by mouth daily with supper. Start on the 22nd day (after taking 15 mg prescription twice daily for 21 days).  30 tablet  0   No current facility-administered medications for this visit.   REVIEW OF SYSTEMS: Valu.Nieves ] denotes positive finding; [  ] denotes negative finding  CARDIOVASCULAR:  [ ]  chest pain   [ ]  chest pressure   [ ]  palpitations   [ ]  orthopnea   [ ]  dyspnea on exertion   [ ]  claudication   [ ]  rest pain   Valu.Nieves ] DVT   Valu.Nieves ] phlebitis PULMONARY:   [ ]  productive cough   [ ]  asthma   [ ]  wheezing NEUROLOGIC:   [ ]  weakness  [ ]  paresthesias  [ ]  aphasia  [ ]  amaurosis  [ ]  dizziness HEMATOLOGIC:   [ ]  bleeding problems   Valu.Nieves ] clotting disorders MUSCULOSKELETAL:  [ ]  joint pain   [ ]  joint swelling Valu.Nieves ] leg swelling GASTROINTESTINAL: [ ]   blood in stool  [ ]   hematemesis GENITOURINARY:  [ ]   dysuria  [ ]   hematuria PSYCHIATRIC:  [ ]   history of major depression INTEGUMENTARY:  [ ]  rashes  [ ]  ulcers CONSTITUTIONAL:  [ ]  fever   [ ]  chills  PHYSICAL EXAM: Filed Vitals:   10/08/13 1114  BP: 121/77  Pulse: 79  Height: 5\' 7"  (1.702 m)  Weight: 234 lb 9.6 oz (106.414 kg)  SpO2: 100%   Body mass index is 36.73 kg/(m^2). GENERAL: The patient is a well-nourished female, in no acute distress. The vital signs are documented above. CARDIOVASCULAR: There is a regular rate and rhythm. I do not detect carotid bruits. She has a palpable pedal pulses. PULMONARY: There is good air exchange bilaterally without wheezing or rales. ABDOMEN: Soft and non-tender with normal pitched bowel sounds.  MUSCULOSKELETAL: There are no major deformities or cyanosis. NEUROLOGIC: No focal weakness or paresthesias are detected. SKIN: She has varicose veins along the medial aspect of both legs and also the lateral posterior aspect of both legs. PSYCHIATRIC: The patient has a normal affect.  DATA:  I have independently interpreted her venous duplex scan. On the right side, she has no evidence of DVT. She does have reflux in the common femoral vein on the right and also in the saphenous vein. On the left side she does not have any evidence of DVT. She has some reflux in the common femoral vein. There is also reflux in the saphenous vein on the left.  MEDICAL ISSUES:  Varicose veins of lower extremities with other complications This patient has chronic venous insufficiency with some reflux and the deep veins and also the saphenous veins bilaterally. Discussed the importance of intermittent leg elevation and also I written her a prescription for knee-high compression stockings with a gradient of 15-20 mm mercury. We also discussed potentially using thigh high stockings with a higher gradient. If her symptoms progress she could potentially be considered for laser ablation of the saphenous veins. Given her factor V Leiden mutation she would likely be at  slightly higher risk for DVT after the procedure has some concerns about this which I think is a legitimate concern. I've encouraged her to avoid prolonged sitting and standing. If she joins a health club then I have explained I think water aerobics is helpful for patients with venous disease. We will see her back as needed.   [facility] [place] Beeper: [phone]

## 2013-10-08 NOTE — Assessment & Plan Note (Signed)
This patient has chronic venous insufficiency with some reflux and the deep veins and also the saphenous veins bilaterally. Discussed the importance of intermittent leg elevation and also I written her a prescription for knee-high compression stockings with a gradient of 15-20 mm mercury. We also discussed potentially using thigh high stockings with a higher gradient. If her symptoms progress she could potentially be considered for laser ablation of the saphenous veins. Given her factor V Leiden mutation she would likely be at slightly higher risk for DVT after the procedure has some concerns about this which I think is a legitimate concern. I've encouraged her to avoid prolonged sitting and standing. If she joins a health club then I have explained I think water aerobics is helpful for patients with venous disease. We will see her back as needed.

## 2013-11-05 ENCOUNTER — Encounter (HOSPITAL_COMMUNITY)
Admission: RE | Admit: 2013-11-05 | Discharge: 2013-11-05 | Disposition: A | Discharge status: Home / Self Care | Payer: BC Managed Care – PPO | Source: Ambulatory Visit | Attending: Obstetrics and Gynecology | Admitting: Obstetrics and Gynecology

## 2013-11-05 DIAGNOSIS — O923 Agalactia: Secondary | ICD-10-CM | POA: Insufficient documentation

## 2013-11-06 ENCOUNTER — Telehealth: Payer: Self-pay | Admitting: Oncology

## 2013-11-06 ENCOUNTER — Ambulatory Visit (HOSPITAL_BASED_OUTPATIENT_CLINIC_OR_DEPARTMENT_OTHER): Payer: BC Managed Care – PPO | Admitting: Oncology

## 2013-11-06 ENCOUNTER — Other Ambulatory Visit: Payer: BC Managed Care – PPO

## 2013-11-06 ENCOUNTER — Encounter: Payer: Self-pay | Admitting: Oncology

## 2013-11-06 VITALS — BP 116/67 | HR 74 | Temp 98.0°F | Resp 20 | Ht 67.0 in | Wt 238.3 lb

## 2013-11-06 DIAGNOSIS — D649 Anemia, unspecified: Secondary | ICD-10-CM

## 2013-11-06 DIAGNOSIS — D509 Iron deficiency anemia, unspecified: Secondary | ICD-10-CM

## 2013-11-06 DIAGNOSIS — I8000 Phlebitis and thrombophlebitis of superficial vessels of unspecified lower extremity: Secondary | ICD-10-CM

## 2013-11-06 MED ORDER — RIVAROXABAN 20 MG PO TABS: ORAL_TABLET | ORAL | Status: DC

## 2013-11-06 MED ORDER — BUTALBITAL-APAP-CAFFEINE 50-325-40 MG PO TABS: 2.0000 | ORAL_TABLET | Freq: Four times a day (QID) | ORAL | Status: DC | PRN

## 2013-11-06 NOTE — Progress Notes (Signed)
Hematology and Oncology Follow Up Visit  Robin Key [id number] Mar 23, 1986 28 y.o. 11/06/2013 8:35 AM Robin Key, Robin Melena, NP   Principle Diagnosis: 28 year old woman with history of pulmonary embolism in the setting of a heterozygous factor V Leiden mutation. This was diagnosed in 2012. She had previous deep vein thrombosis that was treated with Xarelto.   Prior Therapy: Full dose anticoagulation with Lovenox after she had a C-section on 07/01/2013.  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  Interim History:  Robin Key presents today for a followup visit. She is a pleasant woman that I saw in consultation for the management of her anticoagulation during pregnancy. Throughout her pregnancy, she was anticoagulated with full dose Lovenox upper 150 mg subcutaneously daily. Leading up to her delivery, she was switched to subcutaneous heparin therapeutic doses due to its short half-life especially in the setting of a possible C-section. She underwent that the C-section without any complication on 35/00/9381. She continued with Lovenox for about 4 weeks. But in January of 2015 developed superficial thrombophlebitis and currently on Xarelto for it. She has not reported any complications of bleeding. She continues to do well overall without any decline in her energy or performance status. She continued to work full-time and attends to her newborn at this time. She still have some occasional lower extremity swelling and some tenderness in her right calf. She reports some occasional migraine headaches as well.  Medications: I have reviewed the patient's current medications.  Current Outpatient Prescriptions  Medication Sig Dispense Refill  . acetaminophen (TYLENOL) 500 MG tablet Take 1,000 mg by mouth every 6 (six) hours as needed for pain.      . butalbital-acetaminophen-caffeine (FIORICET, ESGIC) 50-325-40 MG per  tablet Take 2 tablets by mouth every 6 (six) hours as needed for headache.  30 tablet  1  . cetirizine (ZYRTEC) 10 MG tablet Take 10 mg by mouth daily.      Marland Kitchen enoxaparin (LOVENOX) 150 MG/ML injection Inject 1 mL (150 mg total) into the skin daily. 150 mg [place] daily for 6 weeks. If any evidence bleeding, she should stop lovenox and go to nearest ER  21 Syringe  1  . ferrous sulfate 325 (65 FE) MG tablet Take 1 tablet (325 mg total) by mouth 2 (two) times daily with a meal.  60 tablet  3  . Rivaroxaban (XARELTO) 15 MG TABS tablet Take 1 tablet (15 mg total) by mouth 2 (two) times daily with a meal. For 21 days. Then start 20 mg prescription once daily.  42 tablet  0  . Rivaroxaban (XARELTO) 20 MG TABS tablet Take one tablet daily.  30 tablet  4   No current facility-administered medications for this visit.     Allergies:  Allergies  Allergen Reactions  . Bactrim [Sulfamethoxazole-Trimethoprim] Other (See Comments)    Headache   . Ciprofloxacin Other (See Comments)    Felt like "bugs crawling" when on Lovenox  . Percocet [Oxycodone-Acetaminophen] Nausea And Vomiting  . Latex Rash  . Penicillins Rash    Past Medical History, Surgical history, Social history, and Family History were reviewed and updated.  Review of Systems:  Remaining ROS negative. Physical Exam: Blood pressure 116/67, pulse 74, temperature 98 F (36.7 C), temperature source Oral, resp. rate 20, height 5\' 7"  (1.702 m), weight 238 lb 4.8 oz (108.092 kg), unknown if currently breastfeeding. ECOG: ECOG 0 General appearance: alert, cooperative and appears stated age  Head: Normocephalic, without obvious abnormality, atraumatic Neck: no adenopathy, no carotid bruit, no JVD, supple, symmetrical, trachea midline and thyroid not enlarged, symmetric, no tenderness/mass/nodules Lymph nodes: Cervical, supraclavicular, and axillary nodes normal. Heart:regular rate and rhythm, S1, S2 normal, no murmur, click, rub or  gallop Lung:chest clear, no wheezing, rales, normal symmetric air entry Abdomin: soft, non-tender, without masses or organomegaly EXT:no erythema, induration, or nodules. Slight edema in the right leg more than the left. Cannot appreciate any erythema or induration.   Lab Results: Lab Results  Component Value Date   WBC 6.2 09/09/2013   HGB 10.6* 09/09/2013   HCT 34.0* 09/09/2013   MCV 73.1* 09/09/2013   PLT 287 09/09/2013     Chemistry      Component Value Date/Time   NA 138 09/09/2013 1440   NA 133* 07/01/2013 1115   K 3.8 09/09/2013 1440   K 3.5 07/01/2013 1115   CL 99 07/01/2013 1115   CO2 25 09/09/2013 1440   CO2 22 07/01/2013 1115   BUN 10.4 09/09/2013 1440   BUN 4* 07/01/2013 1115   CREATININE 0.7 09/09/2013 1440   CREATININE 0.57 07/01/2013 1115      Component Value Date/Time   CALCIUM 9.8 09/09/2013 1440   CALCIUM 9.1 07/01/2013 1115   ALKPHOS 116 09/09/2013 1440   ALKPHOS 194* 07/01/2013 1115   AST 29 09/09/2013 1440   AST 11 07/01/2013 1115   ALT 58* 09/09/2013 1440   ALT 7 07/01/2013 1115   BILITOT 0.38 09/09/2013 1440   BILITOT 0.5 07/01/2013 1115       Impression and Plan:  28 year-old woman with:  1. History of pulmonary embolism in the setting of a factor V Leiden heterozygous mutation. She was anticoagulated with Xarelto at that time.   2. She successfully went through pregnancy and is C-section delivery that was covered with Lovenox and subcutaneous heparin in 06/2013.   3. Right lower extremity superficial thrombophlebitis likely related to her pregnancy state currently on full dose anticoagulation with Xarelto. She is no complications from it at this time and I feel given her high risk of developing deep vein thrombosis and a previous history of pulmonary embolism, I will continue Xarelto for a total of 6 months and then we'll address that issue at that time. The option is to continue Xarelto for lifetime versus switching her to  aspirin for  prophylaxis purposes.   4.  Borderline protein C levels: These have been repeated in February 2015 and appeared normal.  5. Iron deficiency anemia: Her iron levels were low in February and she is taken oral iron. I have educated about importance to keep doing so and if she doesn't she may need IV iron.  6. Followup: Will be in 4 months to reassess her at that time.   [facility], MD 4/2/20158:35 AM

## 2013-11-06 NOTE — Telephone Encounter (Signed)
gv adn printed appt sched for Aug

## 2013-11-06 NOTE — Telephone Encounter (Signed)
per MD add lab...done...he will add orders...printed pt new sched

## 2013-11-14 ENCOUNTER — Telehealth: Payer: Self-pay | Admitting: Oncology

## 2013-11-14 ENCOUNTER — Encounter: Payer: Self-pay | Admitting: *Deleted

## 2013-11-14 ENCOUNTER — Ambulatory Visit (HOSPITAL_BASED_OUTPATIENT_CLINIC_OR_DEPARTMENT_OTHER): Payer: BC Managed Care – PPO

## 2013-11-14 ENCOUNTER — Other Ambulatory Visit: Payer: Self-pay | Admitting: *Deleted

## 2013-11-14 ENCOUNTER — Telehealth: Payer: Self-pay | Admitting: *Deleted

## 2013-11-14 DIAGNOSIS — D509 Iron deficiency anemia, unspecified: Secondary | ICD-10-CM

## 2013-11-14 DIAGNOSIS — D649 Anemia, unspecified: Secondary | ICD-10-CM

## 2013-11-14 LAB — CBC WITH DIFFERENTIAL/PLATELET
BASO%: 0.5 % (ref 0.0–2.0)
Basophils Absolute: 0 10*3/uL (ref 0.0–0.1)
EOS%: 0.7 % (ref 0.0–7.0)
Eosinophils Absolute: 0.1 10*3/uL (ref 0.0–0.5)
HCT: 36 % (ref 34.8–46.6)
HEMOGLOBIN: 11.5 g/dL — AB (ref 11.6–15.9)
LYMPH#: 1.6 10*3/uL (ref 0.9–3.3)
LYMPH%: 16.9 % (ref 14.0–49.7)
MCH: 24 pg — AB (ref 25.1–34.0)
MCHC: 32 g/dL (ref 31.5–36.0)
MCV: 75 fL — ABNORMAL LOW (ref 79.5–101.0)
MONO#: 0.7 10*3/uL (ref 0.1–0.9)
MONO%: 7.6 % (ref 0.0–14.0)
NEUT#: 7.2 10*3/uL — ABNORMAL HIGH (ref 1.5–6.5)
NEUT%: 74.3 % (ref 38.4–76.8)
Platelets: 305 10*3/uL (ref 145–400)
RBC: 4.79 10*6/uL (ref 3.70–5.45)
RDW: 16.9 % — AB (ref 11.2–14.5)
WBC: 9.8 10*3/uL (ref 3.9–10.3)

## 2013-11-14 NOTE — Telephone Encounter (Signed)
Patient calling to say she has been feeling "dizzy". She would like to have her blood checked today. Would be able to come in sometime after 2:00 pm.

## 2013-11-14 NOTE — Telephone Encounter (Signed)
added lab per 4.10.15 pof....per pof pt aware

## 2013-12-05 ENCOUNTER — Encounter (HOSPITAL_COMMUNITY)
Admission: RE | Admit: 2013-12-05 | Discharge: 2013-12-05 | Disposition: A | Discharge status: Home / Self Care | Payer: BC Managed Care – PPO | Source: Ambulatory Visit | Attending: Obstetrics and Gynecology | Admitting: Obstetrics and Gynecology

## 2013-12-05 DIAGNOSIS — O923 Agalactia: Secondary | ICD-10-CM | POA: Insufficient documentation

## 2014-01-05 ENCOUNTER — Encounter (HOSPITAL_COMMUNITY)
Admission: RE | Admit: 2014-01-05 | Discharge: 2014-01-05 | Disposition: A | Discharge status: Home / Self Care | Payer: BC Managed Care – PPO | Source: Ambulatory Visit | Attending: Obstetrics and Gynecology | Admitting: Obstetrics and Gynecology

## 2014-01-05 DIAGNOSIS — O923 Agalactia: Secondary | ICD-10-CM | POA: Insufficient documentation

## 2014-01-21 ENCOUNTER — Telehealth: Payer: Self-pay | Admitting: Medical Oncology

## 2014-01-21 NOTE — Telephone Encounter (Signed)
VMOM from patient stating she is concerned she may be pregnant, she states she is not on birth control and since she is on xarelto concerned about being pregnant while taking this medication. She states did a urine test at home and it was negative, asking for a blood preg test. Reviewed with MD and return call to patient [facility])  that MD would like for her to f/u with her OB for pregnancy test.   Patient to call office with any questions or concerns.  F/u appt lab/MD 03/12/14

## 2014-02-04 ENCOUNTER — Encounter (HOSPITAL_COMMUNITY)
Admission: RE | Admit: 2014-02-04 | Discharge: 2014-02-04 | Disposition: A | Discharge status: Home / Self Care | Payer: BC Managed Care – PPO | Source: Ambulatory Visit | Attending: Obstetrics and Gynecology | Admitting: Obstetrics and Gynecology

## 2014-02-04 DIAGNOSIS — O923 Agalactia: Secondary | ICD-10-CM | POA: Insufficient documentation

## 2014-02-25 ENCOUNTER — Telehealth: Payer: Self-pay | Admitting: *Deleted

## 2014-02-25 ENCOUNTER — Inpatient Hospital Stay (HOSPITAL_COMMUNITY): Payer: BC Managed Care – PPO

## 2014-02-25 ENCOUNTER — Encounter (HOSPITAL_COMMUNITY): Payer: Self-pay | Admitting: *Deleted

## 2014-02-25 ENCOUNTER — Inpatient Hospital Stay (HOSPITAL_COMMUNITY)
Admission: AD | Admit: 2014-02-25 | Discharge: 2014-02-25 | Disposition: A | Discharge status: Home / Self Care | Payer: BC Managed Care – PPO | Source: Ambulatory Visit | Attending: Obstetrics & Gynecology | Admitting: Obstetrics & Gynecology

## 2014-02-25 DIAGNOSIS — I8390 Asymptomatic varicose veins of unspecified lower extremity: Secondary | ICD-10-CM | POA: Insufficient documentation

## 2014-02-25 DIAGNOSIS — N938 Other specified abnormal uterine and vaginal bleeding: Secondary | ICD-10-CM | POA: Insufficient documentation

## 2014-02-25 DIAGNOSIS — Z86711 Personal history of pulmonary embolism: Secondary | ICD-10-CM | POA: Insufficient documentation

## 2014-02-25 DIAGNOSIS — Z7901 Long term (current) use of anticoagulants: Secondary | ICD-10-CM | POA: Insufficient documentation

## 2014-02-25 DIAGNOSIS — N939 Abnormal uterine and vaginal bleeding, unspecified: Secondary | ICD-10-CM

## 2014-02-25 DIAGNOSIS — D6859 Other primary thrombophilia: Secondary | ICD-10-CM | POA: Insufficient documentation

## 2014-02-25 DIAGNOSIS — N949 Unspecified condition associated with female genital organs and menstrual cycle: Secondary | ICD-10-CM | POA: Insufficient documentation

## 2014-02-25 DIAGNOSIS — N898 Other specified noninflammatory disorders of vagina: Secondary | ICD-10-CM

## 2014-02-25 LAB — CBC WITH DIFFERENTIAL/PLATELET
Basophils Absolute: 0 10*3/uL (ref 0.0–0.1)
Basophils Relative: 0 % (ref 0–1)
Eosinophils Absolute: 0.1 10*3/uL (ref 0.0–0.7)
Eosinophils Relative: 1 % (ref 0–5)
HCT: 32.9 % — ABNORMAL LOW (ref 36.0–46.0)
Hemoglobin: 10.6 g/dL — ABNORMAL LOW (ref 12.0–15.0)
LYMPHS ABS: 1.8 10*3/uL (ref 0.7–4.0)
LYMPHS PCT: 25 % (ref 12–46)
MCH: 26.2 pg (ref 26.0–34.0)
MCHC: 32.2 g/dL (ref 30.0–36.0)
MCV: 81.2 fL (ref 78.0–100.0)
MONOS PCT: 6 % (ref 3–12)
Monocytes Absolute: 0.4 10*3/uL (ref 0.1–1.0)
NEUTROS PCT: 68 % (ref 43–77)
Neutro Abs: 4.8 10*3/uL (ref 1.7–7.7)
Platelets: 245 10*3/uL (ref 150–400)
RBC: 4.05 MIL/uL (ref 3.87–5.11)
RDW: 15 % (ref 11.5–15.5)
WBC: 7.1 10*3/uL (ref 4.0–10.5)

## 2014-02-25 LAB — PROTIME-INR
INR: 1.58 — ABNORMAL HIGH (ref 0.00–1.49)
Prothrombin Time: 18.9 seconds — ABNORMAL HIGH (ref 11.6–15.2)

## 2014-02-25 LAB — HCG, QUANTITATIVE, PREGNANCY: hCG, Beta Chain, Quant, S: <1 m[IU]/mL (ref <5)

## 2014-02-25 NOTE — Discharge Instructions (Signed)
Abnormal Uterine Bleeding Abnormal uterine bleeding can affect women at various stages in life, including teenagers, women in their reproductive years, pregnant women, and women who have reached menopause. Several kinds of uterine bleeding are considered abnormal, including:  Bleeding or spotting between periods.   Bleeding after sexual intercourse.   Bleeding that is heavier or more than normal.   Periods that last longer than usual.  Bleeding after menopause.  Many cases of abnormal uterine bleeding are minor and simple to treat, while others are more serious. Any type of abnormal bleeding should be evaluated by your health care provider. Treatment will depend on the cause of the bleeding. HOME CARE INSTRUCTIONS Monitor your condition for any changes. The following actions may help to alleviate any discomfort you are experiencing:  Avoid the use of tampons and douches as directed by your health care provider.  Change your pads frequently. You should get regular pelvic exams and Pap tests. Keep all follow-up appointments for diagnostic tests as directed by your health care provider.  SEEK MEDICAL CARE IF:   Your bleeding lasts more than 1 week.   You feel dizzy at times.  SEEK IMMEDIATE MEDICAL CARE IF:   You pass out.   You are changing pads every 15 to 30 minutes.   You have abdominal pain.  You have a fever.   You become sweaty or weak.   You are passing large blood clots from the vagina.   You start to feel nauseous and vomit. MAKE SURE YOU:   Understand these instructions.  Will watch your condition.  Will get help right away if you are not doing well or get worse. Document Released: 07/24/2005 Document Revised: 07/29/2013 Document Reviewed: 02/20/2013 ExitCare Patient Information 2015 [employer]. This information is not intended to replace advice given to you by your health care provider. Make sure you discuss any questions you have with your  health care provider.  

## 2014-02-25 NOTE — MAU Provider Note (Signed)
CSN: [id number]     Arrival date & time 02/25/14  0259 History   None    Chief Complaint  Patient presents with  . Vaginal Bleeding     (Consider location/radiation/quality/duration/timing/severity/associated sxs/prior Treatment) The history is provided by the patient.   Robin Key is a 28 y.o. female who presents to MAU with heavy vaginal bleeding. She states the bleeding started 2 weeks ago and has gotten worse. She reports using a pad every hours for several days. She has started to feel weak. She is on chronic anticoagulant therapy due to previous PE and DVT. After she delivered her baby they tried stopping the medication and she developed more clots so they started it back. PMH significant for Factor V Leiden.    Past Medical History  Diagnosis Date  . Pulmonary embolism Nov 2012  . PCOS (polycystic ovarian syndrome)   . PONV (postoperative nausea and vomiting)   . Headache(784.0)   . Factor V Leiden   . Infection     UTI  . Blood dyscrasia     factor 5  . Varicose veins    Past Surgical History  Procedure Laterality Date  . Bladder stem stretched    . Cesarean section N/A 07/01/2013    Procedure: CESAREAN SECTION;  Surgeon: Allena Katz, MD;  Location: [facility];  Service: Obstetrics;  Laterality: N/A;  Primary edc 07/11/13   Family History  Problem Relation Age of Onset  . Cancer Mother     skin  . Hyperlipidemia Mother   . Heart disease Father   . Miscarriages / Stillbirths Sister   . Diabetes Maternal Grandmother   . Cancer Maternal Grandmother     breast  . Osteoporosis Maternal Grandmother   . Glaucoma Maternal Grandmother   . Vision loss Maternal Grandmother   . Heart disease Maternal Grandfather   . Hypertension Maternal Grandfather   . Stroke Maternal Grandfather   . Dementia Paternal Grandmother   . Heart disease Paternal Grandfather    History  Substance Use Topics  . Smoking status: Never Smoker   . Smokeless tobacco: Never Used  .  Alcohol Use: No   OB History   Grav Para Term Preterm Abortions TAB SAB Ect Mult Living   1 1 1  0 0 0 0 0 0 1     Review of Systems  Genitourinary: Positive for vaginal bleeding.  Neurological: Positive for light-headedness.  all other systems negative    Allergies  Bactrim; Ciprofloxacin; Percocet; Latex; and Penicillins  Home Medications   Prior to Admission medications   Medication Sig Start Date End Date Taking? Authorizing Provider  butalbital-acetaminophen-caffeine (FIORICET, ESGIC) 50-325-40 MG per tablet Take 2 tablets by mouth every 6 (six) hours as needed for headache. 11/06/13  Yes Wyatt Portela, MD  cetirizine (ZYRTEC) 10 MG tablet Take 10 mg by mouth daily.   Yes Historical Provider, MD  fluticasone (FLONASE) 50 MCG/ACT nasal spray Place 2 sprays into both nostrils daily.   Yes Historical Provider, MD  Rivaroxaban (XARELTO) 20 MG TABS tablet Take one tablet daily. 11/06/13  Yes Wyatt Portela, MD  acetaminophen (TYLENOL) 500 MG tablet Take 1,000 mg by mouth every 6 (six) hours as needed for pain.    Historical Provider, MD  enoxaparin (LOVENOX) 150 MG/ML injection Inject 1 mL (150 mg total) into the skin daily. 150 mg [place] daily for 6 weeks. If any evidence bleeding, she should stop lovenox and go to nearest ER 09/08/13  Wilmon Arms, MD  ferrous sulfate 325 (65 FE) MG tablet Take 1 tablet (325 mg total) by mouth 2 (two) times daily with a meal. 07/03/13   Megan Morris, DO  Rivaroxaban (XARELTO) 15 MG TABS tablet Take 20 mg by mouth daily with supper. For 21 days. Then start 20 mg prescription once daily. 10/08/13   Wyatt Portela, MD   Physical Exam  Nursing note and vitals reviewed. Constitutional: She is oriented to person, place, and time. She appears well-developed and well-nourished. No distress.  HENT:  Head: Normocephalic.  Eyes: Conjunctivae and EOM are normal.  Neck: Neck supple.  Cardiovascular: Normal rate.   Pulmonary/Chest: Effort normal.  Abdominal: Soft.  There is no tenderness.  Genitourinary:  External genitalia without lesions, moderate blood vaginal vault. No CMT, no adnexal tenderness. Uterus without palpable enlargement.   Musculoskeletal: Normal range of motion.  Neurological: She is alert and oriented to person, place, and time. No cranial nerve deficit.  Skin: Skin is warm and dry.  Psychiatric: She has a normal mood and affect. Her behavior is normal.   Results for orders placed during the hospital encounter of 02/25/14 (from the past 24 hour(s))  CBC WITH DIFFERENTIAL     Status: Abnormal   Collection Time    02/25/14  3:37 AM      Result Value Ref Range   WBC 7.1  4.0 - 10.5 K/uL   RBC 4.05  3.87 - 5.11 MIL/uL   Hemoglobin 10.6 (*) 12.0 - 15.0 g/dL   HCT 32.9 (*) 36.0 - 46.0 %   MCV 81.2  78.0 - 100.0 fL   MCH 26.2  26.0 - 34.0 pg   MCHC 32.2  30.0 - 36.0 g/dL   RDW 15.0  11.5 - 15.5 %   Platelets 245  150 - 400 K/uL   Neutrophils Relative % 68  43 - 77 %   Neutro Abs 4.8  1.7 - 7.7 K/uL   Lymphocytes Relative 25  12 - 46 %   Lymphs Abs 1.8  0.7 - 4.0 K/uL   Monocytes Relative 6  3 - 12 %   Monocytes Absolute 0.4  0.1 - 1.0 K/uL   Eosinophils Relative 1  0 - 5 %   Eosinophils Absolute 0.1  0.0 - 0.7 K/uL   Basophils Relative 0  0 - 1 %   Basophils Absolute 0.0  0.0 - 0.1 K/uL  PROTIME-INR     Status: Abnormal   Collection Time    02/25/14  3:37 AM      Result Value Ref Range   Prothrombin Time 18.9 (*) 11.6 - 15.2 seconds   INR 1.58 (*) 0.00 - 1.49  HCG, QUANTITATIVE, PREGNANCY     Status: None   Collection Time    02/25/14  3:40 AM      Result Value Ref Range   hCG, Beta Chain, Quant, S <1  <5 mIU/mL    ED Course  Procedures US Transvaginal Non-ob  02/25/2014   CLINICAL DATA:  Heavy vaginal bleeding for 4 days.  EXAM: TRANSABDOMINAL AND TRANSVAGINAL ULTRASOUND OF PELVIS  TECHNIQUE: Both transabdominal and transvaginal ultrasound examinations of the pelvis were performed. Transabdominal technique was  performed for global imaging of the pelvis including uterus, ovaries, adnexal regions, and pelvic cul-de-sac.  It was necessary to proceed with endovaginal exam following the transabdominal exam to visualize the endometrium.  COMPARISON:  Obstetrical ultrasound November 27, 2012  FINDINGS: Uterus  Measurements: 8.9 x 4 x 5.6  cm. No fibroids or other mass visualized.  Endometrium  Thickness: Thickened at 2.4 cm. Echogenic swirling blood within the endometrium. No discrete mass.  Right ovary  Measurements: 3.7 x 2.4 x 2.9 cm. Normal appearance/no adnexal mass.  Left ovary  Measurements: 3 x 2.5 x 2.6 cm. Normal appearance/no adnexal mass.  Other findings  No free fluid.  IMPRESSION: Markedly thickened endometrium without discrete mass, this could reflect hyperplasia or possibly retained products of conception, recommend correlation with HCG.   Electronically Signed   By: Elon Alas   On: 02/25/2014 04:59   US Pelvis Complete  02/25/2014   CLINICAL DATA:  Heavy vaginal bleeding for 4 days.  EXAM: TRANSABDOMINAL AND TRANSVAGINAL ULTRASOUND OF PELVIS  TECHNIQUE: Both transabdominal and transvaginal ultrasound examinations of the pelvis were performed. Transabdominal technique was performed for global imaging of the pelvis including uterus, ovaries, adnexal regions, and pelvic cul-de-sac.  It was necessary to proceed with endovaginal exam following the transabdominal exam to visualize the endometrium.  COMPARISON:  Obstetrical ultrasound November 27, 2012  FINDINGS: Uterus  Measurements: 8.9 x 4 x 5.6 cm. No fibroids or other mass visualized.  Endometrium  Thickness: Thickened at 2.4 cm. Echogenic swirling blood within the endometrium. No discrete mass.  Right ovary  Measurements: 3.7 x 2.4 x 2.9 cm. Normal appearance/no adnexal mass.  Left ovary  Measurements: 3 x 2.5 x 2.6 cm. Normal appearance/no adnexal mass.  Other findings  No free fluid.  IMPRESSION: Markedly thickened endometrium without discrete mass, this  could reflect hyperplasia or possibly retained products of conception, recommend correlation with HCG.   Electronically Signed   By: Elon Alas   On: 02/25/2014 04:59    MDM  Consult with Dr. Lynnette Caffey, she reviewed the patient's lab and ultrasound. Patient has an appointment scheduled this morning with Dr. Carren Rang. She states she is going to cancel the appointment because he wants to place an IUD and she doesn't want one. She states she will reschedule the appointment. I discussed with the patient in detail need for follow up and to continue her iron. Stable for discharge with only mild bleeding at this time and Hgb. 10.6   Medication List         butalbital-acetaminophen-caffeine 50-325-40 MG per tablet  Commonly known as:  FIORICET, ESGIC  Take 2 tablets by mouth every 6 (six) hours as needed for headache.     cetirizine 10 MG tablet  Commonly known as:  ZYRTEC  Take 10 mg by mouth daily.     ferrous sulfate 325 (65 FE) MG tablet  Take 1 tablet (325 mg total) by mouth 2 (two) times daily with a meal.     fluticasone 50 MCG/ACT nasal spray  Commonly known as:  FLONASE  Place 2 sprays into both nostrils daily.     rivaroxaban 20 MG Tabs tablet  Commonly known as:  XARELTO  Take one tablet daily.

## 2014-02-25 NOTE — MAU Note (Signed)
Pt reports heavy vaginal bleeding x 4 days. States she is changing pads q 1 hour. States she takes a blood thinner.

## 2014-02-25 NOTE — Progress Notes (Signed)
Patient states she does not intend to keep her scheduled appointment this morning with Dr. Carren Rang. States he wants to give her an IUD and she does not "believe in them." Advised patient to keep appointment to discuss her concerns and establish a different plan with her MD. Patient states "the only plan is the IUD and I don't want that." Still advised patient to discuss her concerns with MD. Patient states she will call and reschedule as she must go home to [place] to get her baby and change clothes, get some sleep. Advised patient to call office and tell them what is going on and see if Dr. Carren Rang could see her later today. Delano Metz, NP given report of conversation with patient.

## 2014-02-25 NOTE — Telephone Encounter (Signed)
Patient called Robin Key today stating she was told by [facility] ER last night to call Robin Key for appt with Dr Alen Blew. Patient was having heavy bleeding and on Xarelto and they told her the INR was elevated @ 1.5. This is not typically a lab that is checked with this drug. Visit and labs reviewed with Selena Lesser, NP, and it is not felt that patient needs work in visit at this time. Reminded patient of her appt on 03/12/2014 and to call if she has any other concerns. Instructed her to have Dr Carren Rang call Robin Key today if he has any concerns as well.

## 2014-02-27 ENCOUNTER — Telehealth: Payer: Self-pay | Admitting: Medical Oncology

## 2014-02-27 NOTE — Telephone Encounter (Signed)
Per MD, informed patient that ok to have Prevara 10 mg x 10 day. Patient expressed understanding. Denies further questions.

## 2014-02-27 NOTE — Telephone Encounter (Signed)
Patient LVMOM, states went to her Gynecologist d/t heavy menstrual bleeding and was placed on Prevara 5 mg for 5 days, Gynecologist wishes to change prescription to 10mg  for 10 days but wants to know if ok with Dr Alen Blew d/t to patient taking Xarelto 20mg  daily. Patient states ok to leave VM with her d/t to her being at work.  LOV 04/02 with MD  F/U 08/06 lab/MD  Messaged forwarded to Dr. Alen Blew for review.

## 2014-02-27 NOTE — Telephone Encounter (Signed)
No objections from my stand point.  

## 2014-03-03 ENCOUNTER — Telehealth: Payer: Self-pay | Admitting: Medical Oncology

## 2014-03-03 NOTE — Telephone Encounter (Signed)
Patient called yesterday reporting she went to [facility] ED over the weekend regarding swelling in LLE and a concern for possible DVT, since she has a hx of DVT. Patient stated she had not received the [place] results but was concerned about her PT/INR results. She reports 07/22 PT/INR 18.9/1.58 and "now PT/INR 10.8/1.1" Patient also reports to be taking prevera 10 mg daily, which she started 07/24. Patient states she had requested [facility] to send fax to [place] of [place] results. Call to patient made at end of day yesterday informing her fax not received. Patient requested for me to call and obtain results.  F/U call this am to [facility] and fax of [place] received and given to MD for review. US shows no evidence of LLE DVT.  LVMOM with patient. Per MD, call to patient and informed her of result and that per MD, no changes to her Xarelto. Patient is to f/u with her Gynocologist regarding any changes to pervera if needed.   Patient to return call to office with questions/concerns.

## 2014-03-12 ENCOUNTER — Telehealth: Payer: Self-pay | Admitting: Oncology

## 2014-03-12 ENCOUNTER — Encounter: Payer: Self-pay | Admitting: Oncology

## 2014-03-12 ENCOUNTER — Telehealth: Payer: Self-pay | Admitting: *Deleted

## 2014-03-12 ENCOUNTER — Ambulatory Visit (HOSPITAL_BASED_OUTPATIENT_CLINIC_OR_DEPARTMENT_OTHER): Payer: BC Managed Care – PPO | Admitting: Oncology

## 2014-03-12 ENCOUNTER — Other Ambulatory Visit: Payer: Self-pay | Admitting: *Deleted

## 2014-03-12 ENCOUNTER — Other Ambulatory Visit (HOSPITAL_BASED_OUTPATIENT_CLINIC_OR_DEPARTMENT_OTHER): Payer: BC Managed Care – PPO

## 2014-03-12 VITALS — BP 120/65 | HR 63 | Temp 98.4°F | Wt 243.3 lb

## 2014-03-12 DIAGNOSIS — E039 Hypothyroidism, unspecified: Secondary | ICD-10-CM

## 2014-03-12 DIAGNOSIS — D500 Iron deficiency anemia secondary to blood loss (chronic): Secondary | ICD-10-CM

## 2014-03-12 DIAGNOSIS — R5381 Other malaise: Secondary | ICD-10-CM

## 2014-03-12 DIAGNOSIS — D649 Anemia, unspecified: Secondary | ICD-10-CM

## 2014-03-12 DIAGNOSIS — N920 Excessive and frequent menstruation with regular cycle: Secondary | ICD-10-CM

## 2014-03-12 DIAGNOSIS — R5383 Other fatigue: Secondary | ICD-10-CM

## 2014-03-12 DIAGNOSIS — R635 Abnormal weight gain: Secondary | ICD-10-CM

## 2014-03-12 DIAGNOSIS — I803 Phlebitis and thrombophlebitis of lower extremities, unspecified: Secondary | ICD-10-CM

## 2014-03-12 LAB — IRON AND TIBC CHCC
%SAT: 3 % — ABNORMAL LOW (ref 21–57)
Iron: 13 ug/dL — ABNORMAL LOW (ref 41–142)
TIBC: 383 ug/dL (ref 236–444)
UIBC: 370 ug/dL (ref 120–384)

## 2014-03-12 LAB — FERRITIN CHCC: Ferritin: 11 ng/ml (ref 9–269)

## 2014-03-12 LAB — CBC WITH DIFFERENTIAL/PLATELET
BASO%: 0.7 % (ref 0.0–2.0)
BASOS ABS: 0 10*3/uL (ref 0.0–0.1)
EOS%: 1.3 % (ref 0.0–7.0)
Eosinophils Absolute: 0.1 10*3/uL (ref 0.0–0.5)
HCT: 32.2 % — ABNORMAL LOW (ref 34.8–46.6)
HEMOGLOBIN: 10.4 g/dL — AB (ref 11.6–15.9)
LYMPH%: 19.6 % (ref 14.0–49.7)
MCH: 26.4 pg (ref 25.1–34.0)
MCHC: 32.2 g/dL (ref 31.5–36.0)
MCV: 81.7 fL (ref 79.5–101.0)
MONO#: 0.4 10*3/uL (ref 0.1–0.9)
MONO%: 6.4 % (ref 0.0–14.0)
NEUT#: 4.6 10*3/uL (ref 1.5–6.5)
NEUT%: 72 % (ref 38.4–76.8)
Platelets: 276 10*3/uL (ref 145–400)
RBC: 3.94 10*6/uL (ref 3.70–5.45)
RDW: 17 % — AB (ref 11.2–14.5)
WBC: 6.4 10*3/uL (ref 3.9–10.3)
lymph#: 1.3 10*3/uL (ref 0.9–3.3)

## 2014-03-12 LAB — TSH CHCC: TSH: 5.341 m(IU)/L — ABNORMAL HIGH (ref 0.308–3.960)

## 2014-03-12 MED ORDER — FERROUS SULFATE 325 (65 FE) MG PO TABS: 325.0000 mg | ORAL_TABLET | Freq: Two times a day (BID) | ORAL | Status: DC

## 2014-03-12 NOTE — Telephone Encounter (Signed)
Message copied by Randolm Idol on Thu Mar 12, 2014  4:49 PM ------      Message from: Wyatt Portela      Created: Thu Mar 12, 2014  1:17 PM       Please call her to let her know that her TSH is high which is likely related to low thyroid function.       I recommend a referral to [facility] Endocrinology (POF done). ------

## 2014-03-12 NOTE — Progress Notes (Signed)
Hematology and Oncology Follow Up Visit  Robin Key [id number] Dec 31, 1985 28 y.o. 03/12/2014 9:13 AM Robin Key, Robin Melena, NP   Principle Diagnosis: 28 year old woman with history of pulmonary embolism in the setting of a heterozygous factor V Leiden mutation. This was diagnosed in 2012. She had previous deep vein thrombosis that was treated with Xarelto. She also has iron deficiency anemia related to menorrhagia.  Prior Therapy: Full dose anticoagulation with Lovenox after she had a C-section on 07/01/2013.  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  Interim History:  Robin Key presents today for a followup visit. Since the last visit she have developed symptoms of menorrhagia and persistent periods. She was evaluated by her gynecologist and was started on Provera. She continues to be on Xarelto without any other complications. She has not reported any epistaxis or hematochezia or Key. She has not reported any complications of bleeding. She is reporting some weight gain and increased fatigue as well. She continued to work full-time and attends to her baby at this time. She still have some occasional lower extremity swelling and some tenderness in her right calf. She reports some occasional migraine headaches as well. She was seen in the emergency department last month or leg pain an ultrasound Doppler ruled out any DVT. She does not report any blurry vision or syncope. She does not report any chest pain or dyspnea on exertion. She does not report any nausea or vomiting or abdominal pain. Did not report any neurological symptoms. Rest of her review of systems unremarkable.  Medications: I have reviewed the patient's current medications.  Current Outpatient Prescriptions  Medication Sig Dispense Refill  . butalbital-acetaminophen-caffeine (FIORICET, ESGIC) 50-325-40 MG per tablet Take 2 tablets by mouth  every 6 (six) hours as needed for headache.  30 tablet  1  . cetirizine (ZYRTEC) 10 MG tablet Take 10 mg by mouth daily.      . ferrous sulfate 325 (65 FE) MG tablet Take 1 tablet (325 mg total) by mouth 2 (two) times daily with a meal.  60 tablet  3  . fluticasone (FLONASE) 50 MCG/ACT nasal spray Place 2 sprays into both nostrils daily.      . Rivaroxaban (XARELTO) 20 MG TABS tablet Take one tablet daily.  30 tablet  4   No current facility-administered medications for this visit.     Allergies:  Allergies  Allergen Reactions  . Bactrim [Sulfamethoxazole-Trimethoprim] Other (See Comments)    Headache   . Ciprofloxacin Other (See Comments)    Felt like "bugs crawling" when on Lovenox  . Percocet [Oxycodone-Acetaminophen] Nausea And Vomiting  . Latex Rash  . Penicillins Rash    Past Medical History, Surgical history, Social history, and Family History were reviewed and updated.  Physical Exam: Blood pressure 120/65, pulse 63, temperature 98.4 F (36.9 C), temperature source Oral, weight 243 lb 5 oz (110.366 kg), last menstrual period 02/21/2014, unknown if currently breastfeeding. ECOG: ECOG 0 General appearance: alert Head: Normocephalic, without obvious abnormality, atraumatic Neck: no adenopathy Lymph nodes: Cervical, supraclavicular, and axillary nodes normal. Heart:regular rate and rhythm, S1, S2 normal, no murmur, click, rub or gallop Lung:chest clear, no wheezing, rales, normal symmetric air entry Abdomin: soft, non-tender, without masses or organomegaly EXT:no erythema, induration, or nodules. Slight edema in the right leg more than the left. Cannot appreciate any erythema or induration.   Lab Results: Lab Results  Component Value Date   WBC 6.4  03/12/2014   HGB 10.4* 03/12/2014   HCT 32.2* 03/12/2014   MCV 81.7 03/12/2014   PLT 276 03/12/2014     Chemistry      Component Value Date/Time   NA 138 09/09/2013 1440   NA 133* 07/01/2013 1115   K 3.8 09/09/2013 1440   K 3.5  07/01/2013 1115   CL 99 07/01/2013 1115   CO2 25 09/09/2013 1440   CO2 22 07/01/2013 1115   BUN 10.4 09/09/2013 1440   BUN 4* 07/01/2013 1115   CREATININE 0.7 09/09/2013 1440   CREATININE 0.57 07/01/2013 1115      Component Value Date/Time   CALCIUM 9.8 09/09/2013 1440   CALCIUM 9.1 07/01/2013 1115   ALKPHOS 116 09/09/2013 1440   ALKPHOS 194* 07/01/2013 1115   AST 29 09/09/2013 1440   AST 11 07/01/2013 1115   ALT 58* 09/09/2013 1440   ALT 7 07/01/2013 1115   BILITOT 0.38 09/09/2013 1440   BILITOT 0.5 07/01/2013 1115       Impression and Plan:  28 year-old woman with:  1. History of pulmonary embolism in the setting of a factor V Leiden heterozygous mutation. She was anticoagulated with Xarelto at that time.   2. She successfully went through pregnancy and is C-section delivery that was covered with Lovenox and subcutaneous heparin in 06/2013.   3. Right lower extremity superficial thrombophlebitis likely related to her pregnancy state currently on full dose anticoagulation with Xarelto. Risks and benefits of continuing Xarelto was discussed with her extensively. Although she is having slightly increased menstrual bleeding the risks of a life-threatening blood clots still exceeds the matter complications of bleeding. She prefers to continue some Xarelto and she understands the risks.  4. Borderline protein C levels: These have been repeated in February 2015 and appeared normal.  5. Iron deficiency anemia: Her hemoglobin appears stable at this time. She continues on oral iron twice a day which I refilled for her. I have explained to her the role of IV iron in the future if needed to.  6. Fatigue, weight gain and irregular menstrual cycles: Am checking her TSH today and sure that hypothyroidism is not the issue with her as well.  7. Followup: Will be in 3 months to reassess her at that time.   [facility], MD 8/6/20159:13 AM

## 2014-03-12 NOTE — Telephone Encounter (Signed)
Pt confirmed labs/ov per 08/06 POF, gave pt AVS....KJ °

## 2014-03-12 NOTE — Telephone Encounter (Signed)
Spoke with patient. tsh is high and dr Alen Blew has made a referral to [facility] endocrinology.

## 2014-03-17 ENCOUNTER — Telehealth: Payer: Self-pay | Admitting: Oncology

## 2014-03-17 NOTE — Telephone Encounter (Signed)
, °

## 2014-03-18 ENCOUNTER — Telehealth: Payer: Self-pay | Admitting: Oncology

## 2014-03-18 NOTE — Telephone Encounter (Signed)
, °

## 2014-04-09 ENCOUNTER — Telehealth: Payer: Self-pay | Admitting: Oncology

## 2014-04-09 NOTE — Telephone Encounter (Signed)
, °

## 2014-04-23 ENCOUNTER — Other Ambulatory Visit: Payer: Self-pay | Admitting: *Deleted

## 2014-04-23 ENCOUNTER — Other Ambulatory Visit: Payer: Self-pay | Admitting: Oncology

## 2014-04-23 MED ORDER — RIVAROXABAN 20 MG PO TABS: 20.0000 mg | ORAL_TABLET | Freq: Every day | ORAL | Status: DC

## 2014-05-22 ENCOUNTER — Other Ambulatory Visit: Payer: Self-pay

## 2014-05-25 ENCOUNTER — Other Ambulatory Visit: Payer: Self-pay | Admitting: Oncology

## 2014-05-25 ENCOUNTER — Encounter: Payer: Self-pay | Admitting: *Deleted

## 2014-06-08 ENCOUNTER — Encounter: Payer: Self-pay | Admitting: Oncology

## 2014-06-17 ENCOUNTER — Telehealth: Payer: Self-pay | Admitting: Oncology

## 2014-06-17 NOTE — Telephone Encounter (Signed)
Pt lft msg to r/s due to being sick, pt confirmed labs/ov .... Mailed schedule KJ

## 2014-06-18 ENCOUNTER — Ambulatory Visit: Payer: BC Managed Care – PPO | Admitting: Oncology

## 2014-06-18 ENCOUNTER — Other Ambulatory Visit: Payer: BC Managed Care – PPO

## 2014-07-22 ENCOUNTER — Other Ambulatory Visit (HOSPITAL_BASED_OUTPATIENT_CLINIC_OR_DEPARTMENT_OTHER): Payer: BC Managed Care – PPO

## 2014-07-22 ENCOUNTER — Telehealth: Payer: Self-pay | Admitting: Oncology

## 2014-07-22 ENCOUNTER — Ambulatory Visit (HOSPITAL_BASED_OUTPATIENT_CLINIC_OR_DEPARTMENT_OTHER): Payer: BC Managed Care – PPO | Admitting: Oncology

## 2014-07-22 VITALS — BP 108/68 | HR 68 | Temp 98.1°F | Resp 22 | Ht 65.0 in | Wt 250.0 lb

## 2014-07-22 DIAGNOSIS — N926 Irregular menstruation, unspecified: Secondary | ICD-10-CM

## 2014-07-22 DIAGNOSIS — R5383 Other fatigue: Secondary | ICD-10-CM

## 2014-07-22 DIAGNOSIS — Z86718 Personal history of other venous thrombosis and embolism: Secondary | ICD-10-CM

## 2014-07-22 DIAGNOSIS — D500 Iron deficiency anemia secondary to blood loss (chronic): Secondary | ICD-10-CM

## 2014-07-22 DIAGNOSIS — E038 Other specified hypothyroidism: Secondary | ICD-10-CM

## 2014-07-22 DIAGNOSIS — R635 Abnormal weight gain: Secondary | ICD-10-CM

## 2014-07-22 DIAGNOSIS — E039 Hypothyroidism, unspecified: Secondary | ICD-10-CM

## 2014-07-22 DIAGNOSIS — D6851 Activated protein C resistance: Secondary | ICD-10-CM

## 2014-07-22 DIAGNOSIS — Z86711 Personal history of pulmonary embolism: Secondary | ICD-10-CM

## 2014-07-22 DIAGNOSIS — D649 Anemia, unspecified: Secondary | ICD-10-CM

## 2014-07-22 DIAGNOSIS — E282 Polycystic ovarian syndrome: Secondary | ICD-10-CM

## 2014-07-22 DIAGNOSIS — D509 Iron deficiency anemia, unspecified: Secondary | ICD-10-CM

## 2014-07-22 LAB — COMPREHENSIVE METABOLIC PANEL (CC13)
ALT: 13 U/L (ref 0–55)
ANION GAP: 9 meq/L (ref 3–11)
AST: 10 U/L (ref 5–34)
Albumin: 3.6 g/dL (ref 3.5–5.0)
Alkaline Phosphatase: 122 U/L (ref 40–150)
BILIRUBIN TOTAL: 0.51 mg/dL (ref 0.20–1.20)
BUN: 8.4 mg/dL (ref 7.0–26.0)
CO2: 27 meq/L (ref 22–29)
CREATININE: 0.8 mg/dL (ref 0.6–1.1)
Calcium: 9.2 mg/dL (ref 8.4–10.4)
Chloride: 104 mEq/L (ref 98–109)
EGFR: >90 mL/min/{1.73_m2} (ref >90)
Glucose: 95 mg/dl (ref 70–140)
Potassium: 4.1 mEq/L (ref 3.5–5.1)
Sodium: 140 mEq/L (ref 136–145)
Total Protein: 6.8 g/dL (ref 6.4–8.3)

## 2014-07-22 LAB — CBC WITH DIFFERENTIAL/PLATELET
BASO%: 1 % (ref 0.0–2.0)
Basophils Absolute: 0.1 10*3/uL (ref 0.0–0.1)
EOS%: 1.7 % (ref 0.0–7.0)
Eosinophils Absolute: 0.1 10*3/uL (ref 0.0–0.5)
HEMATOCRIT: 35.8 % (ref 34.8–46.6)
HEMOGLOBIN: 11.1 g/dL — AB (ref 11.6–15.9)
LYMPH#: 1.4 10*3/uL (ref 0.9–3.3)
LYMPH%: 21.1 % (ref 14.0–49.7)
MCH: 22.5 pg — ABNORMAL LOW (ref 25.1–34.0)
MCHC: 30.9 g/dL — ABNORMAL LOW (ref 31.5–36.0)
MCV: 72.7 fL — ABNORMAL LOW (ref 79.5–101.0)
MONO#: 0.4 10*3/uL (ref 0.1–0.9)
MONO%: 5.7 % (ref 0.0–14.0)
NEUT#: 4.7 10*3/uL (ref 1.5–6.5)
NEUT%: 70.5 % (ref 38.4–76.8)
PLATELETS: 274 10*3/uL (ref 145–400)
RBC: 4.92 10*6/uL (ref 3.70–5.45)
RDW: 18.9 % — ABNORMAL HIGH (ref 11.2–14.5)
WBC: 6.7 10*3/uL (ref 3.9–10.3)

## 2014-07-22 LAB — IRON AND TIBC CHCC
%SAT: 8 % — ABNORMAL LOW (ref 21–57)
IRON: 30 ug/dL — AB (ref 41–142)
TIBC: 385 ug/dL (ref 236–444)
UIBC: 355 ug/dL (ref 120–384)

## 2014-07-22 LAB — FERRITIN CHCC: FERRITIN: 6 ng/mL — AB (ref 9–269)

## 2014-07-22 NOTE — Telephone Encounter (Signed)
Gave avs & cal for April. °

## 2014-07-22 NOTE — Progress Notes (Signed)
Hematology and Oncology Follow Up Visit  Robin Key [id number] 1986/02/10 28 y.o. 07/22/2014 9:26 AM Robin Key, Robin Melena, NP   Principle Diagnosis: 28 year old woman with history of pulmonary embolism in the setting of a heterozygous factor V Leiden mutation. This was diagnosed in 2012. She had previous deep vein thrombosis that was treated with Xarelto. She also has iron deficiency anemia related to menorrhagia.  Prior Therapy: Full dose anticoagulation with Lovenox after she had a C-section on 07/01/2013.  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  Interim History:  [name] presents today for a followup visit. Since the last visit she continues to gain weight and have increased fatigue. She continues to have symptoms of menorrhagia and persistent periods. She has not been seen by endocrinology at this point. She continues to be on Xarelto without any other complications. She has not reported any epistaxis or hematochezia or Key. She has not reported any complications of bleeding. She continued to work full-time and attends to her baby at this time. She still have some occasional lower extremity swelling and some tenderness in her right calf. She reports some occasional migraine headaches as well.  She does not report any blurry vision or syncope. She does not report any chest pain or dyspnea on exertion. She does not report any nausea or vomiting or abdominal pain. Did not report any neurological symptoms. Rest of her review of systems unremarkable.  Medications: I have reviewed the patient's current medications.  Current Outpatient Prescriptions  Medication Sig Dispense Refill  . butalbital-acetaminophen-caffeine (FIORICET, ESGIC) 50-325-40 MG per tablet Take 2 tablets by mouth every 6 (six) hours as needed for headache. 30 tablet 1  . cetirizine (ZYRTEC) 10 MG tablet Take 10 mg by mouth daily.     . ferrous sulfate 325 (65 FE) MG tablet Take 1 tablet (325 mg total) by mouth 2 (two) times daily with a meal. 60 tablet 3  . fluticasone (FLONASE) 50 MCG/ACT nasal spray Place 2 sprays into both nostrils daily.    . rivaroxaban (XARELTO) 20 MG TABS tablet Take 1 tablet (20 mg total) by mouth daily with supper. 30 tablet 3   No current facility-administered medications for this visit.     Allergies:  Allergies  Allergen Reactions  . Bactrim [Sulfamethoxazole-Trimethoprim] Other (See Comments)    Headache   . Ciprofloxacin Other (See Comments)    Felt like "bugs crawling" when on Lovenox  . Percocet [Oxycodone-Acetaminophen] Nausea And Vomiting  . Latex Rash  . Penicillins Rash    Past Medical History, Surgical history, Social history, and Family History were reviewed and updated.  Physical Exam: Blood pressure 108/68, pulse 68, temperature 98.1 F (36.7 C), temperature source Oral, resp. rate 22, height 5\' 5"  (1.651 m), weight 250 lb (113.399 kg), unknown if currently breastfeeding. ECOG: ECOG 0 General appearance: alert Head: Normocephalic, without obvious abnormality Neck: no adenopathy Lymph nodes: Cervical, supraclavicular, and axillary nodes normal. Heart:regular rate and rhythm, S1, S2 normal, no murmur, click, rub or gallop Lung:chest clear, no wheezing, rales, normal symmetric air entry Abdomin: soft, non-tender, without masses or organomegaly EXT:no erythema, induration, or nodules.    Lab Results: Lab Results  Component Value Date   WBC 6.7 07/22/2014   HGB 11.1* 07/22/2014   HCT 35.8 07/22/2014   MCV 72.7* 07/22/2014   PLT 274 07/22/2014     Chemistry      Component Value Date/Time   NA  138 09/09/2013 1440   NA 133* 07/01/2013 1115   K 3.8 09/09/2013 1440   K 3.5 07/01/2013 1115   CL 99 07/01/2013 1115   CO2 25 09/09/2013 1440   CO2 22 07/01/2013 1115   BUN 10.4 09/09/2013 1440   BUN 4* 07/01/2013 1115   CREATININE 0.7 09/09/2013 1440    CREATININE 0.57 07/01/2013 1115      Component Value Date/Time   CALCIUM 9.8 09/09/2013 1440   CALCIUM 9.1 07/01/2013 1115   ALKPHOS 116 09/09/2013 1440   ALKPHOS 194* 07/01/2013 1115   AST 29 09/09/2013 1440   AST 11 07/01/2013 1115   ALT 58* 09/09/2013 1440   ALT 7 07/01/2013 1115   BILITOT 0.38 09/09/2013 1440   BILITOT 0.5 07/01/2013 1115       Impression and Plan:  28 year-old woman with:  1. History of pulmonary embolism in the setting of a factor V Leiden heterozygous mutation. She was anticoagulated with Xarelto at that time.   2. She successfully went through pregnancy and is C-section delivery that was covered with Lovenox and subcutaneous heparin in 06/2013.   3. Right lower extremity superficial thrombophlebitis likely related to her pregnancy state currently on full dose anticoagulation with Xarelto. Risks and benefits of continuing Xarelto was discussed with her extensively. She continues to prefer to be on long-term Xarelto which is reasonable at this time but we will continue to assess the risks and benefits of and follow it.  4. Borderline protein C levels: These have been repeated in February 2015 and appeared normal.  5. Iron deficiency anemia: Her hemoglobin appears stable at this time. She is not compliant with her iron and I offered her IV iron at this point. Given the fact that her hemoglobin is stable and slightly improved, she would like to defer that.  6. Fatigue, weight gain and irregular menstrual cycles: I will refer her again to endocrinology for evaluation for hypothyroidism as well as PCOS.   7. Followup: Will be in 4 months to reassess her at that time.   [facility], MD 12/16/20159:26 AM

## 2014-07-23 ENCOUNTER — Telehealth: Payer: Self-pay | Admitting: Oncology

## 2014-07-23 NOTE — Telephone Encounter (Signed)
Faxed pt medical records to Dr. Carlis Abbott [phone]

## 2014-07-29 ENCOUNTER — Other Ambulatory Visit: Payer: Self-pay | Admitting: Internal Medicine

## 2014-07-29 DIAGNOSIS — E049 Nontoxic goiter, unspecified: Secondary | ICD-10-CM

## 2014-08-12 ENCOUNTER — Ambulatory Visit (HOSPITAL_COMMUNITY)
Admission: RE | Admit: 2014-08-12 | Discharge: 2014-08-12 | Disposition: A | Discharge status: Home / Self Care | Payer: BLUE CROSS/BLUE SHIELD | Source: Ambulatory Visit | Attending: Internal Medicine | Admitting: Internal Medicine

## 2014-08-12 ENCOUNTER — Telehealth: Payer: Self-pay | Admitting: Oncology

## 2014-08-12 ENCOUNTER — Other Ambulatory Visit: Payer: Self-pay | Admitting: Oncology

## 2014-08-12 DIAGNOSIS — D500 Iron deficiency anemia secondary to blood loss (chronic): Secondary | ICD-10-CM

## 2014-08-12 DIAGNOSIS — E049 Nontoxic goiter, unspecified: Secondary | ICD-10-CM | POA: Diagnosis present

## 2014-08-12 DIAGNOSIS — R635 Abnormal weight gain: Secondary | ICD-10-CM | POA: Insufficient documentation

## 2014-08-12 NOTE — Telephone Encounter (Signed)
s.w. pt and advised on Jan appt.....pt ok and aware °

## 2014-08-13 ENCOUNTER — Other Ambulatory Visit: Payer: Self-pay | Admitting: *Deleted

## 2014-08-13 ENCOUNTER — Encounter: Payer: Self-pay | Admitting: *Deleted

## 2014-08-13 ENCOUNTER — Telehealth: Payer: Self-pay | Admitting: Oncology

## 2014-08-13 DIAGNOSIS — D500 Iron deficiency anemia secondary to blood loss (chronic): Secondary | ICD-10-CM

## 2014-08-13 NOTE — Progress Notes (Signed)
pof to schedulers for lab,cbc, and iv iron 1 hour asap.

## 2014-08-13 NOTE — Telephone Encounter (Signed)
Confirm appt for 08/14/14. °

## 2014-08-14 ENCOUNTER — Other Ambulatory Visit: Payer: BLUE CROSS/BLUE SHIELD

## 2014-08-14 ENCOUNTER — Other Ambulatory Visit: Payer: Self-pay | Admitting: *Deleted

## 2014-08-14 ENCOUNTER — Other Ambulatory Visit (HOSPITAL_BASED_OUTPATIENT_CLINIC_OR_DEPARTMENT_OTHER): Payer: BLUE CROSS/BLUE SHIELD

## 2014-08-14 ENCOUNTER — Telehealth: Payer: Self-pay | Admitting: Oncology

## 2014-08-14 ENCOUNTER — Ambulatory Visit: Payer: BLUE CROSS/BLUE SHIELD

## 2014-08-14 ENCOUNTER — Other Ambulatory Visit: Payer: Self-pay | Admitting: Oncology

## 2014-08-14 ENCOUNTER — Ambulatory Visit (HOSPITAL_BASED_OUTPATIENT_CLINIC_OR_DEPARTMENT_OTHER): Payer: BLUE CROSS/BLUE SHIELD

## 2014-08-14 DIAGNOSIS — D509 Iron deficiency anemia, unspecified: Secondary | ICD-10-CM

## 2014-08-14 DIAGNOSIS — D500 Iron deficiency anemia secondary to blood loss (chronic): Secondary | ICD-10-CM

## 2014-08-14 LAB — CBC WITH DIFFERENTIAL/PLATELET
BASO%: 0.4 % (ref 0.0–2.0)
BASOS ABS: 0 10*3/uL (ref 0.0–0.1)
EOS%: 1 % (ref 0.0–7.0)
Eosinophils Absolute: 0.1 10*3/uL (ref 0.0–0.5)
HEMATOCRIT: 30.2 % — AB (ref 34.8–46.6)
HGB: 9.3 g/dL — ABNORMAL LOW (ref 11.6–15.9)
LYMPH%: 25.4 % (ref 14.0–49.7)
MCH: 23.1 pg — ABNORMAL LOW (ref 25.1–34.0)
MCHC: 30.8 g/dL — AB (ref 31.5–36.0)
MCV: 75.1 fL — AB (ref 79.5–101.0)
MONO#: 0.5 10*3/uL (ref 0.1–0.9)
MONO%: 6.7 % (ref 0.0–14.0)
NEUT#: 5.2 10*3/uL (ref 1.5–6.5)
NEUT%: 66.5 % (ref 38.4–76.8)
PLATELETS: 289 10*3/uL (ref 145–400)
RBC: 4.02 10*6/uL (ref 3.70–5.45)
RDW: 16.1 % — ABNORMAL HIGH (ref 11.2–14.5)
WBC: 7.9 10*3/uL (ref 3.9–10.3)
lymph#: 2 10*3/uL (ref 0.9–3.3)

## 2014-08-14 LAB — IRON AND TIBC CHCC
%SAT: 8 % — ABNORMAL LOW (ref 21–57)
Iron: 30 ug/dL — ABNORMAL LOW (ref 41–142)
TIBC: 393 ug/dL (ref 236–444)
UIBC: 363 ug/dL (ref 120–384)

## 2014-08-14 LAB — FERRITIN CHCC: Ferritin: 7 ng/ml — ABNORMAL LOW (ref 9–269)

## 2014-08-14 MED ORDER — SODIUM CHLORIDE 0.9 % IV SOLN
510.0000 mg | Freq: Once | INTRAVENOUS | Status: AC
  Administered 2014-08-14: 510 mg via INTRAVENOUS
  Filled 2014-08-14: qty 17

## 2014-08-14 MED ORDER — SODIUM CHLORIDE 0.9 % IV SOLN
Freq: Once | INTRAVENOUS | Status: AC
  Administered 2014-08-14: 15:00:00 via INTRAVENOUS

## 2014-08-14 NOTE — Progress Notes (Signed)
Discharged at 1555, ambulatory in no distress.

## 2014-08-14 NOTE — Patient Instructions (Signed)

## 2014-08-14 NOTE — Telephone Encounter (Signed)
Sent msg to add Feraheme then will call pt with time per 01/08 POF.... Cherylann Banas

## 2014-08-14 NOTE — Progress Notes (Signed)
1425 called for treatment orders.  Also notified that patient is unable to come in on Friday due to work schedule.  Request Thursday 08-20-2014 for 7-day feraheme.

## 2014-08-14 NOTE — Progress Notes (Signed)
Feraheme ended with VSS.  No distress

## 2014-08-14 NOTE — Telephone Encounter (Signed)
gv adn printed appt sched and avs for pt for Jan...sed added tx. °

## 2014-08-14 NOTE — Progress Notes (Signed)
VSS and ready for D/c with no reactions to Feraheme.

## 2014-08-18 ENCOUNTER — Telehealth: Payer: Self-pay | Admitting: Oncology

## 2014-08-18 NOTE — Telephone Encounter (Signed)
Pt called to r/s treatment from 01/18 to 01/15 due to treatment makes her weak and wants the weekend to recover. Sent msg to MD/MM to see about r/s treatment also pt request lab work states her count is 9.0, pt states she sent a msg to MD desk nurse vm.. Requesting lab work, s/w Dixie she recd msg and will let me know if we need to add lab with treatment same day... I will contact pt confirming infor... KJ

## 2014-08-19 ENCOUNTER — Telehealth: Payer: Self-pay | Admitting: Oncology

## 2014-08-19 NOTE — Telephone Encounter (Signed)
S/w pt confirming r/s apt infusion D/T per MD, no labs needed per MD and advised pt... KJ

## 2014-08-21 ENCOUNTER — Ambulatory Visit (HOSPITAL_BASED_OUTPATIENT_CLINIC_OR_DEPARTMENT_OTHER): Payer: BLUE CROSS/BLUE SHIELD

## 2014-08-21 DIAGNOSIS — D500 Iron deficiency anemia secondary to blood loss (chronic): Secondary | ICD-10-CM

## 2014-08-21 DIAGNOSIS — D509 Iron deficiency anemia, unspecified: Secondary | ICD-10-CM

## 2014-08-21 MED ORDER — SODIUM CHLORIDE 0.9 % IV SOLN
Freq: Once | INTRAVENOUS | Status: AC
  Administered 2014-08-21: 14:00:00 via INTRAVENOUS

## 2014-08-21 MED ORDER — SODIUM CHLORIDE 0.9 % IV SOLN
510.0000 mg | Freq: Once | INTRAVENOUS | Status: AC
  Administered 2014-08-21: 510 mg via INTRAVENOUS
  Filled 2014-08-21: qty 17

## 2014-08-21 NOTE — Patient Instructions (Signed)

## 2014-08-24 ENCOUNTER — Ambulatory Visit: Payer: BLUE CROSS/BLUE SHIELD

## 2014-09-03 ENCOUNTER — Ambulatory Visit (HOSPITAL_BASED_OUTPATIENT_CLINIC_OR_DEPARTMENT_OTHER): Payer: BLUE CROSS/BLUE SHIELD | Admitting: Oncology

## 2014-09-03 ENCOUNTER — Ambulatory Visit (HOSPITAL_BASED_OUTPATIENT_CLINIC_OR_DEPARTMENT_OTHER): Payer: BLUE CROSS/BLUE SHIELD

## 2014-09-03 ENCOUNTER — Ambulatory Visit (HOSPITAL_COMMUNITY)
Admission: RE | Admit: 2014-09-03 | Discharge: 2014-09-03 | Disposition: A | Discharge status: Home / Self Care | Payer: BLUE CROSS/BLUE SHIELD | Source: Ambulatory Visit | Attending: Oncology | Admitting: Oncology

## 2014-09-03 ENCOUNTER — Telehealth: Payer: Self-pay | Admitting: Oncology

## 2014-09-03 VITALS — BP 128/59 | HR 99 | Temp 98.5°F | Resp 20 | Ht 66.0 in | Wt 252.4 lb

## 2014-09-03 VITALS — BP 114/55 | HR 80 | Temp 98.5°F | Resp 20

## 2014-09-03 DIAGNOSIS — R635 Abnormal weight gain: Secondary | ICD-10-CM

## 2014-09-03 DIAGNOSIS — D500 Iron deficiency anemia secondary to blood loss (chronic): Secondary | ICD-10-CM

## 2014-09-03 DIAGNOSIS — N920 Excessive and frequent menstruation with regular cycle: Secondary | ICD-10-CM

## 2014-09-03 DIAGNOSIS — R5383 Other fatigue: Secondary | ICD-10-CM

## 2014-09-03 DIAGNOSIS — Z86711 Personal history of pulmonary embolism: Secondary | ICD-10-CM

## 2014-09-03 LAB — CBC & DIFF AND RETIC
BASO%: 0.4 % (ref 0.0–2.0)
BASOS ABS: 0 10*3/uL (ref 0.0–0.1)
EOS%: 0.6 % (ref 0.0–7.0)
Eosinophils Absolute: 0 10*3/uL (ref 0.0–0.5)
HCT: 32.1 % — ABNORMAL LOW (ref 34.8–46.6)
HGB: 10 g/dL — ABNORMAL LOW (ref 11.6–15.9)
Immature Retic Fract: 20.1 % — ABNORMAL HIGH (ref 1.60–10.00)
LYMPH#: 1.2 10*3/uL (ref 0.9–3.3)
LYMPH%: 24.2 % (ref 14.0–49.7)
MCH: 26.5 pg (ref 25.1–34.0)
MCHC: 31.2 g/dL — AB (ref 31.5–36.0)
MCV: 84.9 fL (ref 79.5–101.0)
MONO#: 0.3 10*3/uL (ref 0.1–0.9)
MONO%: 5.8 % (ref 0.0–14.0)
NEUT%: 69 % (ref 38.4–76.8)
NEUTROS ABS: 3.5 10*3/uL (ref 1.5–6.5)
PLATELETS: 257 10*3/uL (ref 145–400)
RBC: 3.78 10*6/uL (ref 3.70–5.45)
RDW: 19.8 % — AB (ref 11.2–14.5)
RETIC CT ABS: 128.14 10*3/uL — AB (ref 33.70–90.70)
Retic %: 3.39 % — ABNORMAL HIGH (ref 0.70–2.10)
WBC: 5 10*3/uL (ref 3.9–10.3)

## 2014-09-03 LAB — HOLD TUBE, BLOOD BANK

## 2014-09-03 LAB — ABO/RH: ABO/RH(D): A POS

## 2014-09-03 MED ORDER — ACETAMINOPHEN 325 MG PO TABS
650.0000 mg | ORAL_TABLET | Freq: Once | ORAL | Status: AC
  Administered 2014-09-03: 650 mg via ORAL

## 2014-09-03 MED ORDER — DIPHENHYDRAMINE HCL 25 MG PO CAPS
ORAL_CAPSULE | ORAL | Status: AC
  Filled 2014-09-03: qty 1

## 2014-09-03 MED ORDER — DIPHENHYDRAMINE HCL 25 MG PO CAPS
25.0000 mg | ORAL_CAPSULE | Freq: Once | ORAL | Status: AC
  Administered 2014-09-03: 25 mg via ORAL

## 2014-09-03 MED ORDER — SODIUM CHLORIDE 0.9 % IV SOLN: 250.0000 mL | Freq: Once | INTRAVENOUS | Status: DC

## 2014-09-03 MED ORDER — ACETAMINOPHEN 325 MG PO TABS
ORAL_TABLET | ORAL | Status: AC
  Filled 2014-09-03: qty 2

## 2014-09-03 MED ORDER — SODIUM CHLORIDE 0.9 % IV SOLN
250.0000 mL | Freq: Once | INTRAVENOUS | Status: AC
  Administered 2014-09-03: 250 mL via INTRAVENOUS

## 2014-09-03 NOTE — Telephone Encounter (Signed)
Labs/ov added per 01/28 POF, sent msg to add 5 hour blood and to give pt updated schedule.... KJ

## 2014-09-03 NOTE — Addendum Note (Signed)
Addended by: Randolm Idol on: 09/03/2014 01:03 PM   Modules accepted: Orders, SmartSet

## 2014-09-03 NOTE — Progress Notes (Signed)
Hematology and Oncology Follow Up Visit  Robin Key [id number] September 24, 1985 29 y.o. 09/03/2014 11:33 AM Robin Key, Robin Melena, NP   Principle Diagnosis: 29 year old woman with history of pulmonary embolism in the setting of a heterozygous factor V Leiden mutation. This was diagnosed in 2012. She had previous deep vein thrombosis that was treated with Xarelto. She also has iron deficiency anemia related to menorrhagia.  Prior Therapy: Full dose anticoagulation with Lovenox after she had a C-section on 07/01/2013.  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  Interim History:  Robin Key presents today for a followup visit. Since the last visit she continues to have vaginal bleeding which has been a heavy as of late. She was given IV iron without really any any major improvement in her hemoglobin drifted down to 7. She is more pale, fatigued and reporting dyspnea on exertion. She was offered an IUD by her gynecologist and she refused. She had been started on Provera an attempt to prevent further bleeding. She has not reported any epistaxis or hematochezia or Key. She continued to work full-time and attends to her baby at this time. She still have some occasional lower extremity swelling and some tenderness in her right calf. She reports some occasional migraine headaches as well.  She does not report any blurry vision or syncope. She does not report any chest pain or dyspnea on exertion. She does not report any nausea or vomiting or abdominal pain. Did not report any neurological symptoms. Rest of her review of systems unremarkable.  Medications: I have reviewed the patient's current medications.  Current Outpatient Prescriptions  Medication Sig Dispense Refill  . butalbital-acetaminophen-caffeine (FIORICET, ESGIC) 50-325-40 MG per tablet Take 2 tablets by mouth every 6 (six) hours as needed for headache. 30  tablet 1  . cetirizine (ZYRTEC) 10 MG tablet Take 10 mg by mouth daily.    . ferrous sulfate 325 (65 FE) MG tablet Take 1 tablet (325 mg total) by mouth 2 (two) times daily with a meal. 60 tablet 3  . fluticasone (FLONASE) 50 MCG/ACT nasal spray Place 2 sprays into both nostrils daily.    . Levothyroxine Sodium 100 MCG CAPS Take 100 mcg by mouth daily before breakfast.    . montelukast (SINGULAIR) 10 MG tablet Take 10 mg by mouth daily.  3  . rivaroxaban (XARELTO) 20 MG TABS tablet Take 1 tablet (20 mg total) by mouth daily with supper. 30 tablet 3   No current facility-administered medications for this visit.     Allergies:  Allergies  Allergen Reactions  . Shellfish-Derived Products Swelling    Shrimp  . Bactrim [Sulfamethoxazole-Trimethoprim] Other (See Comments)    Headache   . Ciprofloxacin Other (See Comments)    Felt like "bugs crawling" when on Lovenox  . Percocet [Oxycodone-Acetaminophen] Nausea And Vomiting  . Latex Rash  . Penicillins Rash    Past Medical History, Surgical history, Social history, and Family History were reviewed and updated.  Physical Exam: Blood pressure 128/59, pulse 99, temperature 98.5 F (36.9 C), temperature source Oral, resp. rate 20, height 5\' 6"  (1.676 m), weight 252 lb 6.4 oz (114.488 kg), SpO2 100 %, unknown if currently breastfeeding. ECOG: ECOG 0 General appearance: alert, pleural appearing woman not in any distress. Head: Normocephalic, without obvious abnormality Neck: no adenopathy Lymph nodes: Cervical, supraclavicular, and axillary nodes normal. Heart:regular rate and rhythm, S1, S2 normal, no murmur, click, rub or gallop Lung:chest clear,  no wheezing, rales, normal symmetric air entry Abdomin: soft, non-tender, without masses or organomegaly EXT:no erythema, induration, or nodules.    Lab Results: Lab Results  Component Value Date   WBC 7.9 08/14/2014   HGB 9.3* 08/14/2014   HCT 30.2* 08/14/2014   MCV 75.1* 08/14/2014    PLT 289 08/14/2014     Chemistry      Component Value Date/Time   NA 140 07/22/2014 0858   NA 133* 07/01/2013 1115   K 4.1 07/22/2014 0858   K 3.5 07/01/2013 1115   CL 99 07/01/2013 1115   CO2 27 07/22/2014 0858   CO2 22 07/01/2013 1115   BUN 8.4 07/22/2014 0858   BUN 4* 07/01/2013 1115   CREATININE 0.8 07/22/2014 0858   CREATININE 0.57 07/01/2013 1115      Component Value Date/Time   CALCIUM 9.2 07/22/2014 0858   CALCIUM 9.1 07/01/2013 1115   ALKPHOS 122 07/22/2014 0858   ALKPHOS 194* 07/01/2013 1115   AST 10 07/22/2014 0858   AST 11 07/01/2013 1115   ALT 13 07/22/2014 0858   ALT 7 07/01/2013 1115   BILITOT 0.51 07/22/2014 0858   BILITOT 0.5 07/01/2013 1115       Impression and Plan:  29 year-old woman with:  1. History of pulmonary embolism in the setting of a factor V Leiden heterozygous mutation. She was anticoagulated with Xarelto at that time.   2. She successfully went through pregnancy and is C-section delivery that was covered with Lovenox and subcutaneous heparin in 06/2013.   3.Anemia: She is related to heavy menstrual bleeding. I discussed her case with her gynecologist Dr. Carren Key attempt to prevent further bleeding at the same time without increasing her risk of thrombosis further. At this time, we have elected to proceed with oral Provera at a higher dose and keep her on Xarelto for the time being. In the meantime, I will keep her on a weekly checkup routine and transfused with packed red cells as needed. I will continue to have discussions with her regarding an IUD which seems to be a better option for her moving forward. She have refused that in the past but we'll continue to address that with her.  4. Fatigue, weight gain and irregular menstrual cycles: I will refer her again to endocrinology for evaluation for hypothyroidism as well as PCOS.   5. Followup: She will continue to come here weekly for lab check and transfusions as needed. She'll have a  clinical evaluation in 4 weeks.   [facility], MD 1/28/201611:33 AM

## 2014-09-03 NOTE — Patient Instructions (Signed)

## 2014-09-04 ENCOUNTER — Telehealth: Payer: Self-pay | Admitting: Oncology

## 2014-09-04 ENCOUNTER — Telehealth: Payer: Self-pay | Admitting: *Deleted

## 2014-09-04 LAB — TYPE AND SCREEN
ABO/RH(D): A POS
Antibody Screen: NEGATIVE
UNIT DIVISION: 0

## 2014-09-04 NOTE — Telephone Encounter (Signed)
S/w pt confirming labs/ov per 01/28 POF, sent msg to add 5 hour blood and pt confirmed, advised pt to p/u a new schedule when she came for her next visit... KJ

## 2014-09-04 NOTE — Telephone Encounter (Signed)
Per staff message and POF I have scheduled appts. Advised scheduler of appts and no available on 2/3 moved to 2/4. JMW

## 2014-09-07 ENCOUNTER — Ambulatory Visit (HOSPITAL_COMMUNITY)
Admission: RE | Admit: 2014-09-07 | Discharge: 2014-09-07 | Disposition: A | Discharge status: Home / Self Care | Payer: BLUE CROSS/BLUE SHIELD | Source: Ambulatory Visit | Attending: Oncology | Admitting: Oncology

## 2014-09-09 ENCOUNTER — Other Ambulatory Visit: Payer: BLUE CROSS/BLUE SHIELD

## 2014-09-10 ENCOUNTER — Other Ambulatory Visit (HOSPITAL_BASED_OUTPATIENT_CLINIC_OR_DEPARTMENT_OTHER): Payer: BLUE CROSS/BLUE SHIELD

## 2014-09-10 ENCOUNTER — Other Ambulatory Visit: Payer: Self-pay | Admitting: Gynecology

## 2014-09-10 DIAGNOSIS — D509 Iron deficiency anemia, unspecified: Secondary | ICD-10-CM

## 2014-09-10 DIAGNOSIS — D500 Iron deficiency anemia secondary to blood loss (chronic): Secondary | ICD-10-CM

## 2014-09-10 LAB — CBC & DIFF AND RETIC
BASO%: 0.4 % (ref 0.0–2.0)
BASOS ABS: 0 10*3/uL (ref 0.0–0.1)
EOS ABS: 0 10*3/uL (ref 0.0–0.5)
EOS%: 0.7 % (ref 0.0–7.0)
HCT: 37.1 % (ref 34.8–46.6)
HGB: 11.5 g/dL — ABNORMAL LOW (ref 11.6–15.9)
IMMATURE RETIC FRACT: 21.4 % — AB (ref 1.60–10.00)
LYMPH%: 24.4 % (ref 14.0–49.7)
MCH: 26.6 pg (ref 25.1–34.0)
MCHC: 31 g/dL — ABNORMAL LOW (ref 31.5–36.0)
MCV: 85.7 fL (ref 79.5–101.0)
MONO#: 0.3 10*3/uL (ref 0.1–0.9)
MONO%: 5.5 % (ref 0.0–14.0)
NEUT#: 3.9 10*3/uL (ref 1.5–6.5)
NEUT%: 69 % (ref 38.4–76.8)
Platelets: 284 10*3/uL (ref 145–400)
RBC: 4.33 10*6/uL (ref 3.70–5.45)
RDW: 17.5 % — ABNORMAL HIGH (ref 11.2–14.5)
Retic %: 2.03 % (ref 0.70–2.10)
Retic Ct Abs: 87.9 10*3/uL (ref 33.70–90.70)
WBC: 5.7 10*3/uL (ref 3.9–10.3)
lymph#: 1.4 10*3/uL (ref 0.9–3.3)

## 2014-09-10 LAB — HOLD TUBE, BLOOD BANK

## 2014-09-11 LAB — CYTOLOGY - PAP

## 2014-09-16 ENCOUNTER — Other Ambulatory Visit (HOSPITAL_BASED_OUTPATIENT_CLINIC_OR_DEPARTMENT_OTHER): Payer: BLUE CROSS/BLUE SHIELD

## 2014-09-16 ENCOUNTER — Other Ambulatory Visit: Payer: Self-pay | Admitting: Oncology

## 2014-09-16 ENCOUNTER — Other Ambulatory Visit: Payer: BLUE CROSS/BLUE SHIELD

## 2014-09-16 DIAGNOSIS — D509 Iron deficiency anemia, unspecified: Secondary | ICD-10-CM

## 2014-09-16 DIAGNOSIS — D500 Iron deficiency anemia secondary to blood loss (chronic): Secondary | ICD-10-CM

## 2014-09-16 LAB — CBC & DIFF AND RETIC
BASO%: 0.3 % (ref 0.0–2.0)
BASOS ABS: 0 10*3/uL (ref 0.0–0.1)
EOS ABS: 0 10*3/uL (ref 0.0–0.5)
EOS%: 0.7 % (ref 0.0–7.0)
HEMATOCRIT: 36.3 % (ref 34.8–46.6)
HGB: 11.4 g/dL — ABNORMAL LOW (ref 11.6–15.9)
Immature Retic Fract: 13.9 % — ABNORMAL HIGH (ref 1.60–10.00)
LYMPH%: 22 % (ref 14.0–49.7)
MCH: 26.5 pg (ref 25.1–34.0)
MCHC: 31.4 g/dL — ABNORMAL LOW (ref 31.5–36.0)
MCV: 84.4 fL (ref 79.5–101.0)
MONO#: 0.6 10*3/uL (ref 0.1–0.9)
MONO%: 9.2 % (ref 0.0–14.0)
NEUT%: 67.8 % (ref 38.4–76.8)
NEUTROS ABS: 4.1 10*3/uL (ref 1.5–6.5)
PLATELETS: 266 10*3/uL (ref 145–400)
RBC: 4.3 10*6/uL (ref 3.70–5.45)
RDW: 16.5 % — AB (ref 11.2–14.5)
Retic %: 1.53 % (ref 0.70–2.10)
Retic Ct Abs: 65.79 10*3/uL (ref 33.70–90.70)
WBC: 6.1 10*3/uL (ref 3.9–10.3)
lymph#: 1.3 10*3/uL (ref 0.9–3.3)

## 2014-09-16 LAB — HOLD TUBE, BLOOD BANK

## 2014-09-22 ENCOUNTER — Other Ambulatory Visit: Payer: Self-pay | Admitting: Oncology

## 2014-09-23 ENCOUNTER — Ambulatory Visit: Payer: BLUE CROSS/BLUE SHIELD | Admitting: Physician Assistant

## 2014-09-23 ENCOUNTER — Telehealth: Payer: Self-pay | Admitting: Physician Assistant

## 2014-09-23 ENCOUNTER — Other Ambulatory Visit: Payer: Self-pay | Admitting: *Deleted

## 2014-09-23 ENCOUNTER — Other Ambulatory Visit: Payer: BLUE CROSS/BLUE SHIELD

## 2014-09-23 ENCOUNTER — Telehealth: Payer: Self-pay | Admitting: *Deleted

## 2014-09-23 MED ORDER — RIVAROXABAN 20 MG PO TABS: 20.0000 mg | ORAL_TABLET | Freq: Every day | ORAL | Status: DC

## 2014-09-23 NOTE — Telephone Encounter (Signed)
Xarelto refilled, pt made aware; requesting to reschedule today's appts for another Wednesday or Thursday, but it cannot be tomorrow or the 25th. Pt reports having a stomach virus and does not want to expose anyone else to it. MD and collaborative RN routed on this note.

## 2014-09-23 NOTE — Telephone Encounter (Signed)
, °

## 2014-10-07 ENCOUNTER — Ambulatory Visit (HOSPITAL_BASED_OUTPATIENT_CLINIC_OR_DEPARTMENT_OTHER): Payer: BLUE CROSS/BLUE SHIELD | Admitting: Physician Assistant

## 2014-10-07 ENCOUNTER — Other Ambulatory Visit (HOSPITAL_BASED_OUTPATIENT_CLINIC_OR_DEPARTMENT_OTHER): Payer: BLUE CROSS/BLUE SHIELD

## 2014-10-07 ENCOUNTER — Encounter: Payer: Self-pay | Admitting: Physician Assistant

## 2014-10-07 VITALS — BP 112/68 | HR 73 | Temp 98.5°F | Resp 18 | Ht 66.0 in | Wt 254.6 lb

## 2014-10-07 DIAGNOSIS — R252 Cramp and spasm: Secondary | ICD-10-CM

## 2014-10-07 DIAGNOSIS — E063 Autoimmune thyroiditis: Secondary | ICD-10-CM

## 2014-10-07 DIAGNOSIS — D509 Iron deficiency anemia, unspecified: Secondary | ICD-10-CM

## 2014-10-07 DIAGNOSIS — Z86718 Personal history of other venous thrombosis and embolism: Secondary | ICD-10-CM

## 2014-10-07 DIAGNOSIS — D500 Iron deficiency anemia secondary to blood loss (chronic): Secondary | ICD-10-CM

## 2014-10-07 DIAGNOSIS — D649 Anemia, unspecified: Secondary | ICD-10-CM

## 2014-10-07 LAB — CBC & DIFF AND RETIC
BASO%: 0.3 % (ref 0.0–2.0)
Basophils Absolute: 0 10*3/uL (ref 0.0–0.1)
EOS ABS: 0.1 10*3/uL (ref 0.0–0.5)
EOS%: 0.7 % (ref 0.0–7.0)
HCT: 37.1 % (ref 34.8–46.6)
HEMOGLOBIN: 11.9 g/dL (ref 11.6–15.9)
IMMATURE RETIC FRACT: 8.7 % (ref 1.60–10.00)
LYMPH%: 21.7 % (ref 14.0–49.7)
MCH: 26.3 pg (ref 25.1–34.0)
MCHC: 32.1 g/dL (ref 31.5–36.0)
MCV: 81.9 fL (ref 79.5–101.0)
MONO#: 0.5 10*3/uL (ref 0.1–0.9)
MONO%: 7 % (ref 0.0–14.0)
NEUT%: 70.3 % (ref 38.4–76.8)
NEUTROS ABS: 4.7 10*3/uL (ref 1.5–6.5)
Platelets: 285 10*3/uL (ref 145–400)
RBC: 4.53 10*6/uL (ref 3.70–5.45)
RDW: 15.3 % — AB (ref 11.2–14.5)
RETIC %: 1.12 % (ref 0.70–2.10)
RETIC CT ABS: 50.74 10*3/uL (ref 33.70–90.70)
WBC: 6.7 10*3/uL (ref 3.9–10.3)
lymph#: 1.5 10*3/uL (ref 0.9–3.3)

## 2014-10-07 LAB — COMPREHENSIVE METABOLIC PANEL (CC13)
ALT: 6 U/L (ref 0–55)
AST: 9 U/L (ref 5–34)
Albumin: 3.7 g/dL (ref 3.5–5.0)
Alkaline Phosphatase: 111 U/L (ref 40–150)
Anion Gap: 10 mEq/L (ref 3–11)
BILIRUBIN TOTAL: 0.49 mg/dL (ref 0.20–1.20)
BUN: 10.7 mg/dL (ref 7.0–26.0)
CHLORIDE: 106 meq/L (ref 98–109)
CO2: 25 mEq/L (ref 22–29)
CREATININE: 0.8 mg/dL (ref 0.6–1.1)
Calcium: 9.2 mg/dL (ref 8.4–10.4)
EGFR: >90 mL/min/{1.73_m2} (ref >90)
GLUCOSE: 94 mg/dL (ref 70–140)
POTASSIUM: 3.8 meq/L (ref 3.5–5.1)
Sodium: 140 mEq/L (ref 136–145)
TOTAL PROTEIN: 6.7 g/dL (ref 6.4–8.3)

## 2014-10-07 LAB — HOLD TUBE, BLOOD BANK

## 2014-10-07 LAB — MAGNESIUM (CC13): MAGNESIUM: 2.1 mg/dL (ref 1.5–2.5)

## 2014-10-07 MED ORDER — RIVAROXABAN 20 MG PO TABS: 20.0000 mg | ORAL_TABLET | Freq: Every day | ORAL | Status: DC

## 2014-10-07 MED ORDER — FERROUS SULFATE 325 (65 FE) MG PO TABS: 325.0000 mg | ORAL_TABLET | Freq: Two times a day (BID) | ORAL | Status: DC

## 2014-10-07 NOTE — Progress Notes (Signed)
Hematology and Oncology Follow Up Visit  Robin Key [id number] 03-16-1986 29 y.o. 10/07/2014 4:40 PM Robin Key, Robin Melena, NP   Principle Diagnosis: 29 year old woman with history of pulmonary embolism in the setting of a heterozygous factor V Leiden mutation. This was diagnosed in 2012. She had previous deep vein thrombosis that was treated with Xarelto. She also has iron deficiency anemia related to menorrhagia.  Prior Therapy: Full dose anticoagulation with Lovenox after she had a C-section on 07/01/2013.  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  Interim History:  Robin Key presents today for a followup visit. Since the last visit she continues to have vaginal bleeding which has been a heavy as of late. She was given IV iron without really any any major improvement in her hemoglobin drifted down to 7. She received blood transfusion in January to address her level of anemia. She reports she continues to have irregular menses that she was told was related to her Hashimoto's thyroiditis. She is currently on thyroid replacement and 100 milligrams by mouth daily. She has some complaints of cramps in her legs. She requests refill for her Xarelto. She also questions whether she needs to continue on oral iron. She would like to get some compression socks or stockings. She has a history of recurrent deep vein thromboses each time she comes off of anticoagulation. She's currently been on Xarelto for about a year. She is considering getting another tattoo.she continues to have some fatigue. She states that she has a follow-up with her gynecologist coming up soon.T recently to another round of Provera in an effort to help her shed the lining of her uterus. She has not reported any epistaxis or hematochezia or Key. She continued to work full-time and attends to her baby at this time. She still have some occasional lower  extremity swelling and some tenderness in her right calf. She reports some occasional migraine headaches as well.  She does not report any blurry vision or syncope. She does not report any chest pain or dyspnea on exertion. She does not report any nausea or vomiting or abdominal pain. Did not report any neurological symptoms. Remainder of her review of systems unremarkable.  Medications: I have reviewed the patient's current medications.  Current Outpatient Prescriptions  Medication Sig Dispense Refill  . butalbital-acetaminophen-caffeine (FIORICET, ESGIC) 50-325-40 MG per tablet Take 2 tablets by mouth every 6 (six) hours as needed for headache. 30 tablet 1  . cetirizine (ZYRTEC) 10 MG tablet Take 10 mg by mouth daily.    . ferrous sulfate 325 (65 FE) MG tablet Take 1 tablet (325 mg total) by mouth 2 (two) times daily with a meal. 60 tablet 3  . fluticasone (FLONASE) 50 MCG/ACT nasal spray Place 2 sprays into both nostrils daily.    . Levothyroxine Sodium 100 MCG CAPS Take 100 mcg by mouth daily before breakfast.    . montelukast (SINGULAIR) 10 MG tablet Take 10 mg by mouth daily.  3  . rivaroxaban (XARELTO) 20 MG TABS tablet Take 1 tablet (20 mg total) by mouth daily. 30 tablet 3   No current facility-administered medications for this visit.     Allergies:  Allergies  Allergen Reactions  . Shellfish-Derived Products Swelling    Shrimp  . Bactrim [Sulfamethoxazole-Trimethoprim] Other (See Comments)    Headache   . Ciprofloxacin Other (See Comments)    Felt like "bugs crawling" when on Lovenox  . Percocet [Oxycodone-Acetaminophen]  Nausea And Vomiting  . Latex Rash  . Penicillins Rash    Past Medical History, Surgical history, Social history, and Family History were reviewed and updated.  Physical Exam: Blood pressure 112/68, pulse 73, temperature 98.5 F (36.9 C), temperature source Oral, resp. rate 18, height 5\' 6"  (1.676 m), weight 254 lb 9.6 oz (115.486 kg), SpO2 98 %, unknown  if currently breastfeeding. ECOG: ECOG 0 General appearance: alert, pleural appearing woman not in any distress. Head: Normocephalic, without obvious abnormality Neck: no adenopathy Lymph nodes: Cervical, supraclavicular, and axillary nodes normal. Heart:regular rate and rhythm, S1, S2 normal, no murmur, click, rub or gallop Lung:chest clear, no wheezing, rales, normal symmetric air entry Abdomin: soft, non-tender, without masses or organomegaly EXT:no erythema, induration, or nodules.    Lab Results: Lab Results  Component Value Date   WBC 6.7 10/07/2014   HGB 11.9 10/07/2014   HCT 37.1 10/07/2014   MCV 81.9 10/07/2014   PLT 285 10/07/2014     Chemistry      Component Value Date/Time   NA 140 10/07/2014 1228   NA 133* 07/01/2013 1115   K 3.8 10/07/2014 1228   K 3.5 07/01/2013 1115   CL 99 07/01/2013 1115   CO2 25 10/07/2014 1228   CO2 22 07/01/2013 1115   BUN 10.7 10/07/2014 1228   BUN 4* 07/01/2013 1115   CREATININE 0.8 10/07/2014 1228   CREATININE 0.57 07/01/2013 1115      Component Value Date/Time   CALCIUM 9.2 10/07/2014 1228   CALCIUM 9.1 07/01/2013 1115   ALKPHOS 111 10/07/2014 1228   ALKPHOS 194* 07/01/2013 1115   AST 9 10/07/2014 1228   AST 11 07/01/2013 1115   ALT 6 10/07/2014 1228   ALT 7 07/01/2013 1115   BILITOT 0.49 10/07/2014 1228   BILITOT 0.5 07/01/2013 1115       Impression and Plan:  29 year-old woman with:  1. History of pulmonary embolism in the setting of a factor V Leiden heterozygous mutation. She was anticoagulated with Xarelto at that time.   2. She successfully went through pregnancy and is C-section delivery that was covered with Lovenox and subcutaneous heparin in 06/2013.   3.Anemia: She is related to heavy menstrual bleeding. She continues on Provera as prescribed by her gynecologist Dr. Carren Rang.she will follow-up with him as previously scheduled. We will keep her on Xarelto 20 mg by mouth daily she is given refill prescription  with 3 additional refills added this was sent her pharmacy of record via E scribe.   4. Fatigue, weight gain and irregular menstrual cycles: patient states that she was told she had Hashimoto's thyroiditis and is now currently on thyroid replacement at 100 mg by mouth daily.  5.lower extremity edema-patient given a prescription for compression socks or stockings. She will obtain these in [place].  5. Followup: She will continue to come here weekly for lab check and transfusions as needed. She'll have a clinical evaluation in 6 weeks.   Carlton Adam, PA-C  3/2/20164:40 PM

## 2014-10-09 NOTE — Patient Instructions (Signed)
Follow up in April as previously scheduled with repeat iron studies

## 2014-10-15 ENCOUNTER — Ambulatory Visit (HOSPITAL_COMMUNITY)
Admission: RE | Admit: 2014-10-15 | Discharge: 2014-10-15 | Disposition: A | Discharge status: Home / Self Care | Payer: BLUE CROSS/BLUE SHIELD | Source: Ambulatory Visit | Attending: Nurse Practitioner | Admitting: Nurse Practitioner

## 2014-10-15 ENCOUNTER — Ambulatory Visit (HOSPITAL_BASED_OUTPATIENT_CLINIC_OR_DEPARTMENT_OTHER): Payer: BLUE CROSS/BLUE SHIELD | Admitting: Nurse Practitioner

## 2014-10-15 ENCOUNTER — Other Ambulatory Visit: Payer: Self-pay | Admitting: *Deleted

## 2014-10-15 ENCOUNTER — Encounter: Payer: Self-pay | Admitting: Nurse Practitioner

## 2014-10-15 DIAGNOSIS — M79605 Pain in left leg: Secondary | ICD-10-CM

## 2014-10-15 DIAGNOSIS — Z86718 Personal history of other venous thrombosis and embolism: Secondary | ICD-10-CM

## 2014-10-15 DIAGNOSIS — M79606 Pain in leg, unspecified: Secondary | ICD-10-CM | POA: Insufficient documentation

## 2014-10-15 DIAGNOSIS — D649 Anemia, unspecified: Secondary | ICD-10-CM | POA: Diagnosis not present

## 2014-10-15 DIAGNOSIS — D500 Iron deficiency anemia secondary to blood loss (chronic): Secondary | ICD-10-CM

## 2014-10-15 DIAGNOSIS — N920 Excessive and frequent menstruation with regular cycle: Secondary | ICD-10-CM

## 2014-10-15 DIAGNOSIS — Z86711 Personal history of pulmonary embolism: Secondary | ICD-10-CM

## 2014-10-15 DIAGNOSIS — D6851 Activated protein C resistance: Secondary | ICD-10-CM

## 2014-10-15 DIAGNOSIS — M25562 Pain in left knee: Secondary | ICD-10-CM

## 2014-10-15 DIAGNOSIS — E063 Autoimmune thyroiditis: Secondary | ICD-10-CM

## 2014-10-15 NOTE — Assessment & Plan Note (Signed)
Patient has been diagnosed with factor VIII Gene mutation.  She's also recently been diagnosed with Hashimoto's thyroiditis as well.  She has a history of chronic iron deficiency anemia secondary to menorrhagia.  She has undergone Provera treatment for her gynecologist Dr. Carren Rang for her menorrhagia in the past.  Patient has a history of both DVT and pulmonary embolism in the past; and continues on Xarelto therapy on a regular basis.  Patient will return on 11/19/2014 for labs and a follow-up visit.  She will also continue to obtain weekly labs here at the cancer center.  She will receive transfusions on an as-needed basis.

## 2014-10-15 NOTE — Assessment & Plan Note (Signed)
Patient is complaining of some left posterior knee pain that is radiating both up and down her leg.  She states that this pain is mild; and intermittent.  She denies any known injury or trauma to her leg.  She does have a history of some chronic varicose veins to bilateral legs.  She states she has not missed any of her doses of Xarelto therapy.  On exam-bilateral lower extremities with no obvious injury or trauma.  Patient does have some varicose veins to her lower extremities as baseline.  All pulses are palpable in all extremities are warm.  Doppler ultrasound obtained today was negative for DVT.  Advised patient to remain as active as possible.  She may also elevate her legs above the level of for heart when resting.  She also want to try some compression stockings as well.

## 2014-10-15 NOTE — Progress Notes (Signed)
[facility]   HPI: Robin Key 29 y.o. female diagnosed with factor VIII gene mutation, iron deficiency anemia, and Hashimoto's thyroiditis.  Currently undergoing Xarelto therapy; as well as transfusional support when needed.  Patient called the cancer Center today requesting urgent care visit.  Patient has a history of both DVT and pulmonary embolism in the past.  She states that she continues with Xarelto therapy as previously directed; and has missed no doses of the Xarelto.  She is complaining of recent onset within the past few days of some left posterior leg pain.  She denies any known injury or trauma to her leg.    She also denies any chest pain, chest pressure, shortness of breath, or pain with inspiration.  Also, patient states that she has undergone Provera per her gynecologist Dr. Carren Rang recently.  HPI  ROS  Past Medical History  Diagnosis Date  . Pulmonary embolism Nov 2012  . PCOS (polycystic ovarian syndrome)   . PONV (postoperative nausea and vomiting)   . Headache(784.0)   . Factor V Leiden   . Infection     UTI  . Blood dyscrasia     factor 5  . Varicose veins     Past Surgical History  Procedure Laterality Date  . Bladder stem stretched    . Cesarean section N/A 07/01/2013    Procedure: CESAREAN SECTION;  Surgeon: Allena Katz, MD;  Location: [facility];  Service: Obstetrics;  Laterality: N/A;  Primary edc 07/11/13    has Cesarean delivery delivered; Anemia; Personal history of venous thrombosis and embolism; Varicose veins of lower extremities with other complications; Pain and swelling of lower extremity; Leg pain; and Factor V Leiden, prothrombin gene mutation on her problem list.    is allergic to shellfish-derived products; bactrim; ciprofloxacin; percocet; latex; and penicillins.    Medication List       This list is accurate as of: 10/15/14  6:31 PM.  Always use your most recent med list.               butalbital-acetaminophen-caffeine 50-325-40 MG per tablet  Commonly known as:  FIORICET, ESGIC  Take 2 tablets by mouth every 6 (six) hours as needed for headache.     cetirizine 10 MG tablet  Commonly known as:  ZYRTEC  Take 10 mg by mouth daily.     ferrous sulfate 325 (65 FE) MG tablet  Take 1 tablet (325 mg total) by mouth 2 (two) times daily with a meal.     fluticasone 50 MCG/ACT nasal spray  Commonly known as:  FLONASE  Place 2 sprays into both nostrils daily.     Levothyroxine Sodium 100 MCG Caps  Take 100 mcg by mouth daily before breakfast.     montelukast 10 MG tablet  Commonly known as:  SINGULAIR  Take 10 mg by mouth daily.     rivaroxaban 20 MG Tabs tablet  Commonly known as:  XARELTO  Take 1 tablet (20 mg total) by mouth daily.         PHYSICAL EXAMINATION  Oncology Vitals 10/07/2014 09/03/2014 09/03/2014 09/03/2014 09/03/2014 08/21/2014 08/14/2014  Height 168 cm - - - 168 cm - -  Weight 115.486 kg - - - 114.488 kg - -  Weight (lbs) 254 lbs 10 oz - - - 252 lbs 6 oz - -  BMI (kg/m2) 41.09 kg/m2 - - - 40.74 kg/m2 - -  Temp 98.5 98.5 97.6 98.4 98.5 97.7 -  Pulse  73 80 75 87 99 (No Data) 76  Resp _0 -  SpO2 98 - - - 100 100 100  BSA (m2) 2.32 m2 - - - 2.31 m2 - -   BP Readings from Last 3 Encounters:  10/07/14 112/68  09/03/14 114/55  09/03/14 128/59    Physical Exam  Constitutional: She is oriented to person, place, and time and well-developed, well-nourished, and in no distress.  HENT:  Head: Normocephalic and atraumatic.  Eyes: Conjunctivae and EOM are normal. Pupils are equal, round, and reactive to light.  Neck: Normal range of motion.  Pulmonary/Chest: Effort normal. No respiratory distress.  Musculoskeletal: Normal range of motion. She exhibits no edema or tenderness.  Neurological: She is alert and oriented to person, place, and time. Gait normal.  Skin: Skin is warm and dry. No rash noted. No erythema. No pallor.  Psychiatric: Affect  normal.  Nursing note and vitals reviewed.   LABORATORY DATA:. No visits with results within 3 Day(s) from this visit. Latest known visit with results is:  Appointment on 10/07/2014  Component Date Value Ref Range Status  . Hold Tube, Blood Bank 10/07/2014 Blood Bank Order Cancelled   Final  . WBC 10/07/2014 6.7  3.9 - 10.3 10e3/uL Final  . NEUT# 10/07/2014 4.7  1.5 - 6.5 10e3/uL Final  . HGB 10/07/2014 11.9  11.6 - 15.9 g/dL Final  . HCT 10/07/2014 37.1  34.8 - 46.6 % Final  . Platelets 10/07/2014 285  145 - 400 10e3/uL Final  . MCV 10/07/2014 81.9  79.5 - 101.0 fL Final  . MCH 10/07/2014 26.3  25.1 - 34.0 pg Final  . MCHC 10/07/2014 32.1  31.5 - 36.0 g/dL Final  . RBC 10/07/2014 4.53  3.70 - 5.45 10e6/uL Final  . RDW 10/07/2014 15.3* 11.2 - 14.5 % Final  . lymph# 10/07/2014 1.5  0.9 - 3.3 10e3/uL Final  . MONO# 10/07/2014 0.5  0.1 - 0.9 10e3/uL Final  . Eosinophils Absolute 10/07/2014 0.1  0.0 - 0.5 10e3/uL Final  . Basophils Absolute 10/07/2014 0.0  0.0 - 0.1 10e3/uL Final  . NEUT% 10/07/2014 70.3  38.4 - 76.8 % Final  . LYMPH% 10/07/2014 21.7  14.0 - 49.7 % Final  . MONO% 10/07/2014 7.0  0.0 - 14.0 % Final  . EOS% 10/07/2014 0.7  0.0 - 7.0 % Final  . BASO% 10/07/2014 0.3  0.0 - 2.0 % Final  . Retic % 10/07/2014 1.12  0.70 - 2.10 % Final  . Retic Ct Abs 10/07/2014 50.74  33.70 - 90.70 10e3/uL Final  . Immature Retic Fract 10/07/2014 8.70  1.60 - 10.00 % Final  . Sodium 10/07/2014 140  136 - 145 mEq/L Final  . Potassium 10/07/2014 3.8  3.5 - 5.1 mEq/L Final  . Chloride 10/07/2014 106  98 - 109 mEq/L Final  . CO2 10/07/2014 25  22 - 29 mEq/L Final  . Glucose 10/07/2014 94  70 - 140 mg/dl Final  . BUN 10/07/2014 10.7  7.0 - 26.0 mg/dL Final  . Creatinine 10/07/2014 0.8  0.6 - 1.1 mg/dL Final  . Total Bilirubin 10/07/2014 0.49  0.20 - 1.20 mg/dL Final  . Alkaline Phosphatase 10/07/2014 111  40 - 150 U/L Final  . AST 10/07/2014 9  5 - 34 U/L Final  . ALT 10/07/2014 6  0 - 55  U/L Final  . Total Protein 10/07/2014 6.7  6.4 - 8.3 g/dL Final  . Albumin 10/07/2014 3.7  3.5 -  5.0 g/dL Final  . Calcium 10/07/2014 9.2  8.4 - 10.4 mg/dL Final  . Anion Gap 10/07/2014 10  3 - 11 mEq/L Final  . EGFR 10/07/2014 >90  >90 ml/min/1.73 m2 Final   eGFR is calculated using the CKD-EPI Creatinine Equation (2009)  . Magnesium 10/07/2014 2.1  1.5 - 2.5 mg/dl Final   Doppler US: Left Lower Extremity Venous Duplex Completed. No evidence for DVT or SVT. Brianna L Mazza,RVT  RADIOGRAPHIC STUDIES: No results found.  ASSESSMENT/PLAN:    Factor V Leiden, prothrombin gene mutation Patient has been diagnosed with factor VIII Gene mutation.  She's also recently been diagnosed with Hashimoto's thyroiditis as well.  She has a history of chronic iron deficiency anemia secondary to menorrhagia.  She has undergone Provera treatment for her gynecologist Dr. Carren Rang for her menorrhagia in the past.  Patient has a history of both DVT and pulmonary embolism in the past; and continues on Xarelto therapy on a regular basis.  Patient will return on 11/19/2014 for labs and a follow-up visit.  She will also continue to obtain weekly labs here at the cancer center.  She will receive transfusions on an as-needed basis.   Leg pain Patient is complaining of some left posterior knee pain that is radiating both up and down her leg.  She states that this pain is mild; and intermittent.  She denies any known injury or trauma to her leg.  She does have a history of some chronic varicose veins to bilateral legs.  She states she has not missed any of her doses of Xarelto therapy.  On exam-bilateral lower extremities with no obvious injury or trauma.  Patient does have some varicose veins to her lower extremities as baseline.  All pulses are palpable in all extremities are warm.  Doppler ultrasound obtained today was negative for DVT.  Advised patient to remain as active as possible.  She may also elevate her  legs above the level of for heart when resting.  She also want to try some compression stockings as well.   Patient stated understanding of all instructions; and was in agreement with this plan of care. The patient knows to call the clinic with any problems, questions or concerns.   Review/collaboration with Dr. Alen Blew regarding all aspects of patient's visit today.   Total time spent with patient was 25 minutes;  with greater than 75 percent of that time spent in face to face counseling regarding patient's symptoms,  and coordination of care and follow up.  Disclaimer: This note was dictated with voice recognition software. Similar sounding words can inadvertently be transcribed and may not be corrected upon review.   Drue Second, NP 10/15/2014

## 2014-10-15 NOTE — Progress Notes (Signed)
Left Lower Extremity Venous Duplex Completed. No evidence for DVT or SVT. °Brianna L Mazza,RVT °

## 2014-10-15 NOTE — Progress Notes (Signed)
Patient calling to c/o pain behind her left knee, that is radiating up her thigh and down to her calf. Denies red streaks or fever. To see cynthia bacon @ 2:00 pm today, per [employer].

## 2014-11-19 ENCOUNTER — Telehealth: Payer: Self-pay | Admitting: Oncology

## 2014-11-19 ENCOUNTER — Other Ambulatory Visit (HOSPITAL_BASED_OUTPATIENT_CLINIC_OR_DEPARTMENT_OTHER): Payer: BLUE CROSS/BLUE SHIELD

## 2014-11-19 ENCOUNTER — Ambulatory Visit (HOSPITAL_BASED_OUTPATIENT_CLINIC_OR_DEPARTMENT_OTHER): Payer: BLUE CROSS/BLUE SHIELD | Admitting: Oncology

## 2014-11-19 ENCOUNTER — Telehealth: Payer: Self-pay | Admitting: *Deleted

## 2014-11-19 VITALS — BP 109/68 | HR 67 | Temp 97.8°F | Resp 17 | Ht 66.0 in | Wt 258.1 lb

## 2014-11-19 DIAGNOSIS — R635 Abnormal weight gain: Secondary | ICD-10-CM

## 2014-11-19 DIAGNOSIS — Z86711 Personal history of pulmonary embolism: Secondary | ICD-10-CM | POA: Diagnosis not present

## 2014-11-19 DIAGNOSIS — R5383 Other fatigue: Secondary | ICD-10-CM

## 2014-11-19 DIAGNOSIS — Z86718 Personal history of other venous thrombosis and embolism: Secondary | ICD-10-CM

## 2014-11-19 DIAGNOSIS — N926 Irregular menstruation, unspecified: Secondary | ICD-10-CM

## 2014-11-19 DIAGNOSIS — I809 Phlebitis and thrombophlebitis of unspecified site: Secondary | ICD-10-CM

## 2014-11-19 DIAGNOSIS — E282 Polycystic ovarian syndrome: Secondary | ICD-10-CM

## 2014-11-19 DIAGNOSIS — E038 Other specified hypothyroidism: Secondary | ICD-10-CM

## 2014-11-19 DIAGNOSIS — D500 Iron deficiency anemia secondary to blood loss (chronic): Secondary | ICD-10-CM

## 2014-11-19 LAB — CBC WITH DIFFERENTIAL/PLATELET
BASO%: 0.9 % (ref 0.0–2.0)
Basophils Absolute: 0.1 10*3/uL (ref 0.0–0.1)
EOS ABS: 0.1 10*3/uL (ref 0.0–0.5)
EOS%: 1.3 % (ref 0.0–7.0)
HCT: 37.5 % (ref 34.8–46.6)
HEMOGLOBIN: 12 g/dL (ref 11.6–15.9)
LYMPH%: 19.9 % (ref 14.0–49.7)
MCH: 24.8 pg — ABNORMAL LOW (ref 25.1–34.0)
MCHC: 32.1 g/dL (ref 31.5–36.0)
MCV: 77.1 fL — AB (ref 79.5–101.0)
MONO#: 0.4 10*3/uL (ref 0.1–0.9)
MONO%: 6.3 % (ref 0.0–14.0)
NEUT#: 4.5 10*3/uL (ref 1.5–6.5)
NEUT%: 71.6 % (ref 38.4–76.8)
PLATELETS: 270 10*3/uL (ref 145–400)
RBC: 4.87 10*6/uL (ref 3.70–5.45)
RDW: 15.8 % — ABNORMAL HIGH (ref 11.2–14.5)
WBC: 6.3 10*3/uL (ref 3.9–10.3)
lymph#: 1.3 10*3/uL (ref 0.9–3.3)

## 2014-11-19 LAB — IRON AND TIBC CHCC
%SAT: 8 % — AB (ref 21–57)
Iron: 29 ug/dL — ABNORMAL LOW (ref 41–142)
TIBC: 349 ug/dL (ref 236–444)
UIBC: 320 ug/dL (ref 120–384)

## 2014-11-19 LAB — COMPREHENSIVE METABOLIC PANEL (CC13)
ALT: 11 U/L (ref 0–55)
ANION GAP: 4 meq/L (ref 3–11)
AST: 10 U/L (ref 5–34)
Albumin: 3.6 g/dL (ref 3.5–5.0)
Alkaline Phosphatase: 108 U/L (ref 40–150)
BUN: 7.7 mg/dL (ref 7.0–26.0)
CO2: 30 mEq/L — ABNORMAL HIGH (ref 22–29)
CREATININE: 0.8 mg/dL (ref 0.6–1.1)
Calcium: 8.9 mg/dL (ref 8.4–10.4)
Chloride: 106 mEq/L (ref 98–109)
EGFR: >90 mL/min/{1.73_m2} (ref >90)
Glucose: 105 mg/dl (ref 70–140)
POTASSIUM: 4.2 meq/L (ref 3.5–5.1)
SODIUM: 139 meq/L (ref 136–145)
TOTAL PROTEIN: 6.8 g/dL (ref 6.4–8.3)
Total Bilirubin: 0.59 mg/dL (ref 0.20–1.20)

## 2014-11-19 LAB — FERRITIN CHCC: FERRITIN: 13 ng/mL (ref 9–269)

## 2014-11-19 LAB — HCG, SERUM, QUALITATIVE: PREG SERUM: NEGATIVE

## 2014-11-19 NOTE — Progress Notes (Signed)
Hematology and Oncology Follow Up Visit  Robin Key [id number] 10/30/1985 29 y.o. 11/19/2014 8:33 AM Robin Key, Robin Melena, NP   Principle Diagnosis: 29 year old woman with:  1. History of pulmonary embolism in the setting of a heterozygous factor V Leiden mutation. This was diagnosed in 2012. She had previous deep vein thrombosis that was treated with Xarelto. 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  Prior Therapy: Full dose anticoagulation with Lovenox after she had a C-section on 07/01/2013. She is status post IV iron in the form of Feraheme in January 2016.  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  Interim History:  Robin Key presents today for a followup visit. Since the last visit, she reports that her menstrual cycles have stopped completely. She is no longer having menorrhagia after she have taking 10 days of oral Provera. She also has started on Synthroid that has been titrated. She's currently  on Xarelto and have tolerated it well. She had not had any other bleeding or thrombosis episodes. She continues to have some fatigue.   She has not reported any epistaxis or hematochezia or Key. She continued to work full-time and attends to her baby at this time. She still have some occasional lower extremity swelling and some tenderness in her right calf. She reports some occasional migraine headaches as well.  She does not report any blurry vision or syncope. She does not report any chest pain or dyspnea on exertion. She does not report any nausea or vomiting or abdominal pain. Did not report any neurological symptoms. Remainder of her review of systems unremarkable.  Medications: I have reviewed the patient's current medications.  Current Outpatient Prescriptions  Medication Sig Dispense Refill  . butalbital-acetaminophen-caffeine (FIORICET, ESGIC) 50-325-40 MG per tablet Take 2 tablets by  mouth every 6 (six) hours as needed for headache. 30 tablet 1  . cetirizine (ZYRTEC) 10 MG tablet Take 10 mg by mouth daily.    . ferrous sulfate 325 (65 FE) MG tablet Take 1 tablet (325 mg total) by mouth 2 (two) times daily with a meal. 60 tablet 3  . fluticasone (FLONASE) 50 MCG/ACT nasal spray Place 2 sprays into both nostrils daily.    . Levothyroxine Sodium 100 MCG CAPS Take 100 mcg by mouth daily before breakfast.    . montelukast (SINGULAIR) 10 MG tablet Take 10 mg by mouth daily.  3  . rivaroxaban (XARELTO) 20 MG TABS tablet Take 1 tablet (20 mg total) by mouth daily. 30 tablet 3   No current facility-administered medications for this visit.     Allergies:  Allergies  Allergen Reactions  . Shellfish-Derived Products Swelling    Shrimp  . Bactrim [Sulfamethoxazole-Trimethoprim] Other (See Comments)    Headache   . Ciprofloxacin Other (See Comments)    Felt like "bugs crawling" when on Lovenox  . Percocet [Oxycodone-Acetaminophen] Nausea And Vomiting  . Latex Rash  . Penicillins Rash    Past Medical History, Surgical history, Social history, and Family History were reviewed and updated.  Physical Exam: Blood pressure 109/68, pulse 67, temperature 97.8 F (36.6 C), temperature source Oral, resp. rate 17, height 5\' 6"  (1.676 m), weight 258 lb 1.6 oz (117.073 kg), SpO2 100 %, unknown if currently breastfeeding. ECOG: ECOG 0 General appearance: alert, pleural appearing woman not in any distress. Head: Normocephalic, without obvious abnormality Neck: no adenopathy Lymph nodes: Cervical, supraclavicular, and axillary nodes normal. Heart:regular rate and rhythm, S1, S2 normal,  no murmur, click, rub or gallop Lung:chest clear, no wheezing, rales, normal symmetric air entry Abdomin: soft, non-tender, without masses or organomegaly EXT:no erythema, induration, or nodules.    Lab Results: Lab Results  Component Value Date   WBC 6.3 11/19/2014   HGB 12.0 11/19/2014   HCT  37.5 11/19/2014   MCV 77.1* 11/19/2014   PLT 270 11/19/2014     Chemistry      Component Value Date/Time   NA 140 10/07/2014 1228   NA 133* 07/01/2013 1115   K 3.8 10/07/2014 1228   K 3.5 07/01/2013 1115   CL 99 07/01/2013 1115   CO2 25 10/07/2014 1228   CO2 22 07/01/2013 1115   BUN 10.7 10/07/2014 1228   BUN 4* 07/01/2013 1115   CREATININE 0.8 10/07/2014 1228   CREATININE 0.57 07/01/2013 1115      Component Value Date/Time   CALCIUM 9.2 10/07/2014 1228   CALCIUM 9.1 07/01/2013 1115   ALKPHOS 111 10/07/2014 1228   ALKPHOS 194* 07/01/2013 1115   AST 9 10/07/2014 1228   AST 11 07/01/2013 1115   ALT 6 10/07/2014 1228   ALT 7 07/01/2013 1115   BILITOT 0.49 10/07/2014 1228   BILITOT 0.5 07/01/2013 1115       Impression and Plan:  29 year-old woman with:  1. History of pulmonary embolism in the setting of a factor V Leiden heterozygous mutation. She was anticoagulated with Xarelto at that time.   2. She successfully went through pregnancy and is C-section delivery that was covered with Lovenox and subcutaneous heparin in 06/2013.   3.Anemia: She is related to heavy menstrual bleeding. Her hemoglobin is back to normal at this time and her menorrhagia have subsided. She actually had not had a menstrual cycle for last 3 months.  4. Fatigue, weight gain and irregular menstrual cycles: patient states that she was told she had Hashimoto's thyroiditis and is now currently on thyroid replacement at 100 mg by mouth daily.   5. Possible pregnancy: I will check a beta hCG to evaluate her for that. If she is indeed pregnant again, she will need to be switched back to Lovenox from Xarelto.  6. Followup: In 3 months sooner if needed to.   [facility], MD 4/14/20168:33 AM

## 2014-11-19 NOTE — Telephone Encounter (Signed)
Spoke with patient, per dr Alen Blew, her pregnancy test is negative.

## 2014-11-19 NOTE — Telephone Encounter (Signed)
-----   Message from Wyatt Portela, MD sent at 11/19/2014  2:42 PM EDT ----- Please let her know that her pregnancy test is negative.

## 2014-11-19 NOTE — Telephone Encounter (Signed)
gave and printed appt sched and avs for pt for JUly... °

## 2014-12-07 ENCOUNTER — Encounter: Payer: Self-pay | Admitting: Oncology

## 2015-02-10 ENCOUNTER — Other Ambulatory Visit: Payer: BLUE CROSS/BLUE SHIELD

## 2015-02-10 ENCOUNTER — Ambulatory Visit: Payer: BLUE CROSS/BLUE SHIELD | Admitting: Oncology

## 2015-03-15 ENCOUNTER — Other Ambulatory Visit: Payer: Self-pay | Admitting: Oncology

## 2015-03-16 ENCOUNTER — Telehealth: Payer: Self-pay | Admitting: *Deleted

## 2015-03-16 NOTE — Telephone Encounter (Signed)
Patient needs a Xarelto refill and would like to know if its okay to see a chiropractor. Message sent to MD Wandalee Ferdinand.

## 2015-03-16 NOTE — Telephone Encounter (Signed)
This RN tried calling patient but her phone is not set up to receive voice mails. Per Dr. Alen Blew, it's OK to refill Xarelto and go to the chiropractor. Xarelto refilled today.

## 2015-04-18 ENCOUNTER — Other Ambulatory Visit: Payer: Self-pay | Admitting: Oncology

## 2015-04-27 ENCOUNTER — Telehealth: Payer: Self-pay | Admitting: *Deleted

## 2015-04-27 NOTE — Telephone Encounter (Signed)
Call from patient asking if this office received ferritin level lab results from her doctors office.  Dixie RN conformed receipt of labs currently in Dr. Hazeline Junker desk folder for review.

## 2015-04-28 ENCOUNTER — Other Ambulatory Visit: Payer: Self-pay | Admitting: Oncology

## 2015-04-28 ENCOUNTER — Telehealth: Payer: Self-pay | Admitting: Oncology

## 2015-04-28 DIAGNOSIS — D509 Iron deficiency anemia, unspecified: Secondary | ICD-10-CM

## 2015-04-28 NOTE — Telephone Encounter (Signed)
S/w pt confirming labs for 09/23 per 09/21 pt can't come on 09/22 due to she is working a double... KJ

## 2015-04-29 ENCOUNTER — Telehealth: Payer: Self-pay | Admitting: *Deleted

## 2015-04-29 NOTE — Telephone Encounter (Signed)
Spoke with patient, states she will come on 04/30/15 for lab work. She is working today. States she would like to be called with results.  Note to dr  Hazeline Junker desk

## 2015-04-30 ENCOUNTER — Other Ambulatory Visit (HOSPITAL_BASED_OUTPATIENT_CLINIC_OR_DEPARTMENT_OTHER): Payer: BLUE CROSS/BLUE SHIELD

## 2015-04-30 DIAGNOSIS — D509 Iron deficiency anemia, unspecified: Secondary | ICD-10-CM | POA: Diagnosis not present

## 2015-04-30 LAB — CBC WITH DIFFERENTIAL/PLATELET
BASO%: 1.1 % (ref 0.0–2.0)
Basophils Absolute: 0.1 10*3/uL (ref 0.0–0.1)
EOS ABS: 0.1 10*3/uL (ref 0.0–0.5)
EOS%: 0.9 % (ref 0.0–7.0)
HCT: 38.2 % (ref 34.8–46.6)
HGB: 12.1 g/dL (ref 11.6–15.9)
LYMPH%: 24 % (ref 14.0–49.7)
MCH: 24.9 pg — AB (ref 25.1–34.0)
MCHC: 31.7 g/dL (ref 31.5–36.0)
MCV: 78.6 fL — AB (ref 79.5–101.0)
MONO#: 0.5 10*3/uL (ref 0.1–0.9)
MONO%: 6.5 % (ref 0.0–14.0)
NEUT#: 5.7 10*3/uL (ref 1.5–6.5)
NEUT%: 67.5 % (ref 38.4–76.8)
PLATELETS: 275 10*3/uL (ref 145–400)
RBC: 4.86 10*6/uL (ref 3.70–5.45)
RDW: 15.3 % — ABNORMAL HIGH (ref 11.2–14.5)
WBC: 8.4 10*3/uL (ref 3.9–10.3)
lymph#: 2 10*3/uL (ref 0.9–3.3)

## 2015-05-03 LAB — FERRITIN CHCC: Ferritin: 12 ng/ml (ref 9–269)

## 2015-05-03 LAB — IRON AND TIBC CHCC
%SAT: 9 % — ABNORMAL LOW (ref 21–57)
Iron: 31 ug/dL — ABNORMAL LOW (ref 41–142)
TIBC: 356 ug/dL (ref 236–444)
UIBC: 326 ug/dL (ref 120–384)

## 2015-05-04 ENCOUNTER — Other Ambulatory Visit: Payer: Self-pay | Admitting: Oncology

## 2015-05-04 ENCOUNTER — Telehealth: Payer: Self-pay | Admitting: *Deleted

## 2015-05-04 NOTE — Telephone Encounter (Signed)
POF sent to schedulers.

## 2015-05-04 NOTE — Telephone Encounter (Signed)
Informed patient of her iron level being 12. Patient would like to schedule an iron transfusion. Message to be given to Dr. Alen Blew.

## 2015-05-06 ENCOUNTER — Ambulatory Visit: Payer: BLUE CROSS/BLUE SHIELD

## 2015-05-07 ENCOUNTER — Ambulatory Visit (HOSPITAL_BASED_OUTPATIENT_CLINIC_OR_DEPARTMENT_OTHER): Payer: BLUE CROSS/BLUE SHIELD

## 2015-05-07 VITALS — BP 117/67 | HR 65 | Temp 98.1°F | Resp 18

## 2015-05-07 DIAGNOSIS — N920 Excessive and frequent menstruation with regular cycle: Secondary | ICD-10-CM

## 2015-05-07 DIAGNOSIS — D500 Iron deficiency anemia secondary to blood loss (chronic): Secondary | ICD-10-CM

## 2015-05-07 MED ORDER — SODIUM CHLORIDE 0.9 % IV SOLN
510.0000 mg | Freq: Once | INTRAVENOUS | Status: AC
  Administered 2015-05-07: 510 mg via INTRAVENOUS
  Filled 2015-05-07: qty 17

## 2015-05-07 MED ORDER — SODIUM CHLORIDE 0.9 % IV SOLN
Freq: Once | INTRAVENOUS | Status: AC
  Administered 2015-05-07: 08:00:00 via INTRAVENOUS

## 2015-05-07 NOTE — Patient Instructions (Signed)

## 2015-05-13 ENCOUNTER — Telehealth: Payer: Self-pay | Admitting: *Deleted

## 2015-05-13 ENCOUNTER — Encounter: Payer: Self-pay | Admitting: Oncology

## 2015-05-13 ENCOUNTER — Ambulatory Visit: Payer: BLUE CROSS/BLUE SHIELD

## 2015-05-13 NOTE — Telephone Encounter (Signed)
Patient called and moved her appt ti later in the day tomorrow

## 2015-05-14 ENCOUNTER — Telehealth: Payer: Self-pay | Admitting: Oncology

## 2015-05-14 ENCOUNTER — Ambulatory Visit (HOSPITAL_BASED_OUTPATIENT_CLINIC_OR_DEPARTMENT_OTHER): Payer: BLUE CROSS/BLUE SHIELD

## 2015-05-14 DIAGNOSIS — N920 Excessive and frequent menstruation with regular cycle: Secondary | ICD-10-CM | POA: Diagnosis not present

## 2015-05-14 DIAGNOSIS — D500 Iron deficiency anemia secondary to blood loss (chronic): Secondary | ICD-10-CM | POA: Diagnosis not present

## 2015-05-14 MED ORDER — SODIUM CHLORIDE 0.9 % IV SOLN
Freq: Once | INTRAVENOUS | Status: AC
  Administered 2015-05-14: 15:00:00 via INTRAVENOUS

## 2015-05-14 MED ORDER — DIPHENHYDRAMINE HCL 25 MG PO CAPS
50.0000 mg | ORAL_CAPSULE | Freq: Once | ORAL | Status: AC
  Administered 2015-05-14: 50 mg via ORAL

## 2015-05-14 MED ORDER — DIPHENHYDRAMINE HCL 25 MG PO CAPS
ORAL_CAPSULE | ORAL | Status: AC
  Filled 2015-05-14: qty 2

## 2015-05-14 MED ORDER — SODIUM CHLORIDE 0.9 % IV SOLN
510.0000 mg | Freq: Once | INTRAVENOUS | Status: AC
  Administered 2015-05-14: 510 mg via INTRAVENOUS
  Filled 2015-05-14: qty 17

## 2015-05-14 NOTE — Patient Instructions (Signed)

## 2015-05-14 NOTE — Telephone Encounter (Signed)
s.w. pt and sched f/u....pt ok and aware °

## 2015-05-17 ENCOUNTER — Other Ambulatory Visit: Payer: Self-pay | Admitting: Oncology

## 2015-05-20 ENCOUNTER — Encounter: Payer: Self-pay | Admitting: Oncology

## 2015-05-24 ENCOUNTER — Telehealth: Payer: Self-pay | Admitting: *Deleted

## 2015-05-24 NOTE — Telephone Encounter (Signed)
Per dr Alen Blew he has no objection  To patient taking inositol and folic acid. Patient notified.

## 2015-05-26 NOTE — Telephone Encounter (Signed)
No note

## 2015-05-27 ENCOUNTER — Other Ambulatory Visit: Payer: Self-pay | Admitting: Oncology

## 2015-05-27 ENCOUNTER — Encounter: Payer: Self-pay | Admitting: *Deleted

## 2015-05-27 DIAGNOSIS — D509 Iron deficiency anemia, unspecified: Secondary | ICD-10-CM

## 2015-05-28 ENCOUNTER — Ambulatory Visit (HOSPITAL_BASED_OUTPATIENT_CLINIC_OR_DEPARTMENT_OTHER): Payer: BLUE CROSS/BLUE SHIELD | Admitting: Physician Assistant

## 2015-05-28 ENCOUNTER — Other Ambulatory Visit (HOSPITAL_BASED_OUTPATIENT_CLINIC_OR_DEPARTMENT_OTHER): Payer: BLUE CROSS/BLUE SHIELD

## 2015-05-28 ENCOUNTER — Telehealth: Payer: Self-pay | Admitting: Oncology

## 2015-05-28 VITALS — BP 115/66 | HR 62 | Temp 98.2°F | Resp 20 | Ht 66.0 in | Wt 261.5 lb

## 2015-05-28 DIAGNOSIS — D500 Iron deficiency anemia secondary to blood loss (chronic): Secondary | ICD-10-CM

## 2015-05-28 DIAGNOSIS — D509 Iron deficiency anemia, unspecified: Secondary | ICD-10-CM

## 2015-05-28 DIAGNOSIS — Z86711 Personal history of pulmonary embolism: Secondary | ICD-10-CM

## 2015-05-28 DIAGNOSIS — Z7901 Long term (current) use of anticoagulants: Secondary | ICD-10-CM

## 2015-05-28 DIAGNOSIS — R600 Localized edema: Secondary | ICD-10-CM

## 2015-05-28 DIAGNOSIS — Z86718 Personal history of other venous thrombosis and embolism: Secondary | ICD-10-CM

## 2015-05-28 DIAGNOSIS — E079 Disorder of thyroid, unspecified: Secondary | ICD-10-CM

## 2015-05-28 DIAGNOSIS — R635 Abnormal weight gain: Secondary | ICD-10-CM

## 2015-05-28 DIAGNOSIS — R5383 Other fatigue: Secondary | ICD-10-CM | POA: Diagnosis not present

## 2015-05-28 DIAGNOSIS — N920 Excessive and frequent menstruation with regular cycle: Secondary | ICD-10-CM | POA: Diagnosis not present

## 2015-05-28 LAB — CBC WITH DIFFERENTIAL/PLATELET
BASO%: 0.8 % (ref 0.0–2.0)
BASOS ABS: 0 10*3/uL (ref 0.0–0.1)
EOS%: 1.3 % (ref 0.0–7.0)
Eosinophils Absolute: 0.1 10*3/uL (ref 0.0–0.5)
HCT: 40.1 % (ref 34.8–46.6)
HEMOGLOBIN: 12.9 g/dL (ref 11.6–15.9)
LYMPH%: 19.8 % (ref 14.0–49.7)
MCH: 26.4 pg (ref 25.1–34.0)
MCHC: 32.3 g/dL (ref 31.5–36.0)
MCV: 81.6 fL (ref 79.5–101.0)
MONO#: 0.5 10*3/uL (ref 0.1–0.9)
MONO%: 7.6 % (ref 0.0–14.0)
NEUT%: 70.5 % (ref 38.4–76.8)
NEUTROS ABS: 4.2 10*3/uL (ref 1.5–6.5)
Platelets: 215 10*3/uL (ref 145–400)
RBC: 4.91 10*6/uL (ref 3.70–5.45)
RDW: 17.7 % — AB (ref 11.2–14.5)
WBC: 6 10*3/uL (ref 3.9–10.3)
lymph#: 1.2 10*3/uL (ref 0.9–3.3)

## 2015-05-28 NOTE — Telephone Encounter (Signed)
sw pt and advised on DEC appt...pt ok and aware °

## 2015-05-28 NOTE — Progress Notes (Signed)
Hematology and Oncology Follow Up Visit  Robin Key [id number] 11/10/1985 29 y.o. 05/28/2015 8:53 AM Robin Key, Robin Melena, NP   Principle Diagnosis: 29 year old woman with:  1. History of pulmonary embolism in the setting of a heterozygous factor V Leiden mutation. This was diagnosed in 2012. She had previous deep vein thrombosis that was treated with Xarelto. 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  Prior Therapy:  Full dose anticoagulation with Lovenox after she had a C-section on 07/01/2013.  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  Interim History:  Robin Key presents today for a followup visit. Since the last visit, she has been doing relatively fair without any major complications. She received of Feraheme on 05/14/2015 without any major complications. She have not felt any improvement at this time they'll likely too early. She continues to report that her menstrual cycles are heavy, for which she is taking Provera. She continues to be on Synthroid that has been titrated. She's currently on Xarelto and have tolerated it well. She had not had any other bleeding or thrombosis episodes. She continues to have some fatigue.  She has not reported any epistaxis or hematochezia or Key. She continued to work full-time and attends to her baby at this time. She still have some occasional lower extremity swelling witout tenderness in her left calf. She denies any further migraine headaches. She does not report any blurry vision or syncope. She does not report any chest pain or dyspnea on exertion. She does not report any nausea or vomiting or abdominal pain. Did not report any neurological symptoms. She does report metallic taste in her mouth Remainder of her review of systems unremarkable.   Medications: I have reviewed  the patient's current medications.  Current Outpatient Prescriptions  Medication Sig Dispense Refill  . butalbital-acetaminophen-caffeine (FIORICET, ESGIC) 50-325-40 MG per tablet Take 2 tablets by mouth every 6 (six) hours as needed for headache. 30 tablet 1  . cetirizine (ZYRTEC) 10 MG tablet Take 10 mg by mouth daily.    . ferrous sulfate 325 (65 FE) MG tablet Take 1 tablet (325 mg total) by mouth 2 (two) times daily with a meal. 60 tablet 3  . fluticasone (FLONASE) 50 MCG/ACT nasal spray Place 2 sprays into both nostrils daily.    . montelukast (SINGULAIR) 10 MG tablet Take 10 mg by mouth daily.  3  . rivaroxaban (XARELTO) 20 MG TABS tablet Take 1 tablet (20 mg total) by mouth daily. 30 tablet 3  . XARELTO 20 MG TABS tablet TAKE ONE TABLET BY MOUTH ONCE DAILY 30 tablet 0   No current facility-administered medications for this visit.     Allergies:  Allergies  Allergen Reactions  . Shellfish-Derived Products Swelling    Shrimp  . Bactrim [Sulfamethoxazole-Trimethoprim] Other (See Comments)    Headache   . Ciprofloxacin Other (See Comments)    Felt like "bugs crawling" when on Lovenox  . Percocet [Oxycodone-Acetaminophen] Nausea And Vomiting  . Latex Rash  . Penicillins Rash    Past Medical History, Surgical history, Social history, and Family History were reviewed and updated.  Physical Exam: Blood pressure 115/66, pulse 62, temperature 98.2 F (36.8 C), temperature source Oral, resp. rate 20, height 5\' 6"  (1.676 m), weight 261 lb 8 oz (118.616 kg), SpO2 100 %, unknown if currently breastfeeding. ECOG: ECOG 0 General  appearance: alert, healthy-appearing woman without distress. Head: Normocephalic, without obvious abnormality Neck: no adenopathy Lymph nodes: Cervical, supraclavicular, and axillary nodes normal. Heart:regular rate and rhythm, S1, S2 normal, no murmur, click, rub or gallop Lung:chest clear, no wheezing, rales, normal symmetric air entry Abdomin: soft,  non-tender, without masses or organomegaly EXT:no erythema, induration, or nodules. Slight increase in her left lower extremity edema.   Lab Results: Lab Results  Component Value Date   WBC 6.0 05/28/2015   HGB 12.9 05/28/2015   HCT 40.1 05/28/2015   MCV 81.6 05/28/2015   PLT 215 05/28/2015     Chemistry      Component Value Date/Time   NA 139 11/19/2014 0806   NA 133* 07/01/2013 1115   K 4.2 11/19/2014 0806   K 3.5 07/01/2013 1115   CL 99 07/01/2013 1115   CO2 30* 11/19/2014 0806   CO2 22 07/01/2013 1115   BUN 7.7 11/19/2014 0806   BUN 4* 07/01/2013 1115   CREATININE 0.8 11/19/2014 0806   CREATININE 0.57 07/01/2013 1115      Component Value Date/Time   CALCIUM 8.9 11/19/2014 0806   CALCIUM 9.1 07/01/2013 1115   ALKPHOS 108 11/19/2014 0806   ALKPHOS 194* 07/01/2013 1115   AST 10 11/19/2014 0806   AST 11 07/01/2013 1115   ALT 11 11/19/2014 0806   ALT 7 07/01/2013 1115   BILITOT 0.59 11/19/2014 0806   BILITOT 0.5 07/01/2013 1115     Iron/TIBC/Ferritin/ %Sat    Component Value Date/Time   IRON 31* 04/30/2015 1506   TIBC 356 04/30/2015 1506   FERRITIN 12 04/30/2015 1506   IRONPCTSAT 9* 04/30/2015 1506    Impression and Plan:  29 year-old woman with:  1. History of pulmonary embolism in the setting of a factor V Leiden heterozygous mutation. She was anticoagulated with Xarelto at that time. She will probably need lifetime anticoagulation at this time.   2.Anemia:  Related to heavy menstrual bleeding. Status post IV iron infusion in October 2016 after her iron level was 31 and September 2016. Her hemoglobin normalized today and iron studies will be checked in 2 months. We will continue supplement IV iron as needed.   3. Fatigue, weight gain and irregular menstrual cycles: Likely related to thyroid disease and currently on thyroid supplements followed by her primary care physician regarding that.  4. Lower extremity edema: Likely related to her history of DVT  and I have encouraged her to use impression stocking to help with that.   Patient was seen and examined fully today. The documentation was done by me personally with assistance from [facility]. The plan was discussed with the patient today and orders for future lab visits were personally placed today.    [facility] MD 05/28/2015

## 2015-06-11 ENCOUNTER — Other Ambulatory Visit: Payer: Self-pay | Admitting: Oncology

## 2015-07-09 ENCOUNTER — Other Ambulatory Visit: Payer: Self-pay | Admitting: Oncology

## 2015-07-23 ENCOUNTER — Ambulatory Visit: Payer: BLUE CROSS/BLUE SHIELD | Admitting: Oncology

## 2015-07-23 ENCOUNTER — Other Ambulatory Visit: Payer: BLUE CROSS/BLUE SHIELD

## 2015-07-23 ENCOUNTER — Ambulatory Visit: Payer: BLUE CROSS/BLUE SHIELD

## 2015-07-30 ENCOUNTER — Other Ambulatory Visit: Payer: Self-pay | Admitting: Oncology

## 2015-07-30 ENCOUNTER — Ambulatory Visit: Payer: BLUE CROSS/BLUE SHIELD

## 2015-07-30 ENCOUNTER — Telehealth: Payer: Self-pay | Admitting: Oncology

## 2015-07-30 NOTE — Telephone Encounter (Signed)
pt cld to state she CX appt-adv to call Dr Alen Blew nurse to r/s pt appt & Dione Housekeeper

## 2015-08-04 ENCOUNTER — Other Ambulatory Visit: Payer: Self-pay | Admitting: Oncology

## 2015-08-18 ENCOUNTER — Telehealth: Payer: Self-pay | Admitting: Oncology

## 2015-08-18 NOTE — Telephone Encounter (Signed)
Patient called to r/s appt to 01/20 @ 3:30 lab, 4 md

## 2015-08-24 ENCOUNTER — Telehealth: Payer: Self-pay | Admitting: Oncology

## 2015-08-24 NOTE — Telephone Encounter (Signed)
Per FS moved 1/20 f/u from him to [facility]. Called patient but was not able to reach her or leave message - vm not set up. Schedule mailed.

## 2015-08-27 ENCOUNTER — Telehealth: Payer: Self-pay | Admitting: Oncology

## 2015-08-27 ENCOUNTER — Other Ambulatory Visit (HOSPITAL_BASED_OUTPATIENT_CLINIC_OR_DEPARTMENT_OTHER): Payer: BLUE CROSS/BLUE SHIELD

## 2015-08-27 ENCOUNTER — Encounter: Payer: Self-pay | Admitting: Oncology

## 2015-08-27 ENCOUNTER — Ambulatory Visit (HOSPITAL_BASED_OUTPATIENT_CLINIC_OR_DEPARTMENT_OTHER): Payer: BLUE CROSS/BLUE SHIELD | Admitting: Oncology

## 2015-08-27 VITALS — BP 119/74 | HR 79 | Temp 98.0°F | Resp 17 | Ht 66.0 in | Wt 264.9 lb

## 2015-08-27 DIAGNOSIS — D500 Iron deficiency anemia secondary to blood loss (chronic): Secondary | ICD-10-CM | POA: Diagnosis not present

## 2015-08-27 DIAGNOSIS — I809 Phlebitis and thrombophlebitis of unspecified site: Secondary | ICD-10-CM | POA: Diagnosis not present

## 2015-08-27 DIAGNOSIS — Z23 Encounter for immunization: Secondary | ICD-10-CM | POA: Diagnosis not present

## 2015-08-27 DIAGNOSIS — D509 Iron deficiency anemia, unspecified: Secondary | ICD-10-CM

## 2015-08-27 DIAGNOSIS — N921 Excessive and frequent menstruation with irregular cycle: Secondary | ICD-10-CM | POA: Diagnosis not present

## 2015-08-27 DIAGNOSIS — R5383 Other fatigue: Secondary | ICD-10-CM

## 2015-08-27 DIAGNOSIS — D6851 Activated protein C resistance: Secondary | ICD-10-CM | POA: Diagnosis not present

## 2015-08-27 DIAGNOSIS — R609 Edema, unspecified: Secondary | ICD-10-CM

## 2015-08-27 DIAGNOSIS — Z86718 Personal history of other venous thrombosis and embolism: Secondary | ICD-10-CM

## 2015-08-27 DIAGNOSIS — Z86711 Personal history of pulmonary embolism: Secondary | ICD-10-CM

## 2015-08-27 DIAGNOSIS — R635 Abnormal weight gain: Secondary | ICD-10-CM

## 2015-08-27 DIAGNOSIS — N926 Irregular menstruation, unspecified: Secondary | ICD-10-CM

## 2015-08-27 LAB — COMPREHENSIVE METABOLIC PANEL
ALT: 18 U/L (ref 0–55)
ANION GAP: 9 meq/L (ref 3–11)
AST: 17 U/L (ref 5–34)
Albumin: 4 g/dL (ref 3.5–5.0)
Alkaline Phosphatase: 100 U/L (ref 40–150)
BILIRUBIN TOTAL: 0.79 mg/dL (ref 0.20–1.20)
BUN: 11.5 mg/dL (ref 7.0–26.0)
CHLORIDE: 102 meq/L (ref 98–109)
CO2: 28 meq/L (ref 22–29)
Calcium: 9.7 mg/dL (ref 8.4–10.4)
Creatinine: 0.9 mg/dL (ref 0.6–1.1)
EGFR: 84 mL/min/{1.73_m2} — AB (ref >90)
Glucose: 84 mg/dl (ref 70–140)
POTASSIUM: 3.9 meq/L (ref 3.5–5.1)
SODIUM: 139 meq/L (ref 136–145)
Total Protein: 7.5 g/dL (ref 6.4–8.3)

## 2015-08-27 LAB — IRON AND TIBC
%SAT: 9 % — AB (ref 21–57)
IRON: 27 ug/dL — AB (ref 41–142)
TIBC: 291 ug/dL (ref 236–444)
UIBC: 264 ug/dL (ref 120–384)

## 2015-08-27 LAB — CBC WITH DIFFERENTIAL/PLATELET
BASO%: 0.7 % (ref 0.0–2.0)
Basophils Absolute: 0.1 10*3/uL (ref 0.0–0.1)
EOS%: 0.7 % (ref 0.0–7.0)
Eosinophils Absolute: 0.1 10*3/uL (ref 0.0–0.5)
HEMATOCRIT: 41.5 % (ref 34.8–46.6)
HGB: 13.8 g/dL (ref 11.6–15.9)
LYMPH#: 2 10*3/uL (ref 0.9–3.3)
LYMPH%: 22.5 % (ref 14.0–49.7)
MCH: 28.4 pg (ref 25.1–34.0)
MCHC: 33.3 g/dL (ref 31.5–36.0)
MCV: 85.4 fL (ref 79.5–101.0)
MONO#: 0.7 10*3/uL (ref 0.1–0.9)
MONO%: 7.6 % (ref 0.0–14.0)
NEUT#: 6.2 10*3/uL (ref 1.5–6.5)
NEUT%: 68.5 % (ref 38.4–76.8)
Platelets: 268 10*3/uL (ref 145–400)
RBC: 4.86 10*6/uL (ref 3.70–5.45)
RDW: 13.4 % (ref 11.2–14.5)
WBC: 9.1 10*3/uL (ref 3.9–10.3)

## 2015-08-27 LAB — FERRITIN: FERRITIN: 86 ng/mL (ref 9–269)

## 2015-08-27 MED ORDER — FERROUS SULFATE 325 (65 FE) MG PO TABS: 325.0000 mg | ORAL_TABLET | Freq: Two times a day (BID) | ORAL | Status: DC

## 2015-08-27 MED ORDER — RIVAROXABAN 20 MG PO TABS: 20.0000 mg | ORAL_TABLET | Freq: Every day | ORAL | Status: DC

## 2015-08-27 MED ORDER — INFLUENZA VAC SPLIT QUAD 0.5 ML IM SUSY
0.5000 mL | PREFILLED_SYRINGE | Freq: Once | INTRAMUSCULAR | Status: AC
  Administered 2015-08-27: 0.5 mL via INTRAMUSCULAR
  Filled 2015-08-27: qty 0.5

## 2015-08-27 NOTE — Patient Instructions (Signed)

## 2015-08-27 NOTE — Telephone Encounter (Signed)
Unable to reach patient in regards to appointments in March per 1/20 pof. Voicemail was not set up to leave message.

## 2015-08-27 NOTE — Progress Notes (Signed)
Hematology and Oncology Follow Up Visit  Robin Key [id number] 06-05-1986 29 y.o. 08/27/2015 2:28 PM Robin Key, [name], [name], NP   Principle Diagnosis: 30 year old woman with:  1. History of pulmonary embolism in the setting of a heterozygous factor V Leiden mutation. This was diagnosed in 2012. She had previous deep vein thrombosis that was treated with Xarelto. 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  Prior Therapy:  Full dose anticoagulation with Lovenox after she had a C-section on 07/01/2013.  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  Interim History:  Robin Key presents today for a followup visit. Since the last visit, she has been doing relatively fair without any major complications. She received of Feraheme on 05/14/2015 without any major complications. Thinks she needs iron again due to arthralgias in her neck. Have been ongoing for about 1 month. Has not been having heavy periods anymore. She continues to be on Synthroid that has been titrated. She's currently on Xarelto and have tolerated it well. She had not had any other bleeding or thrombosis episodes. She continues to have some fatigue.  She has not reported any epistaxis or hematochezia or melena. She continued to work full-time and attends to her baby at this time. She still have some occasional lower extremity swelling witout tenderness in her left calf. She denies any further migraine headaches. She does not report any blurry vision or syncope. She does not report any chest pain or dyspnea on exertion. She does not report any nausea or vomiting or abdominal pain. Did not report any neurological symptoms. Remainder of her review of systems unremarkable.   Medications: I have reviewed the patient's current medications.  Current Outpatient Prescriptions   Medication Sig Dispense Refill  . butalbital-acetaminophen-caffeine (FIORICET, ESGIC) 50-325-40 MG per tablet Take 2 tablets by mouth every 6 (six) hours as needed for headache. 30 tablet 1  . cetirizine (ZYRTEC) 10 MG tablet Take 10 mg by mouth daily.    . ferrous sulfate 325 (65 FE) MG tablet Take 1 tablet (325 mg total) by mouth 2 (two) times daily with a meal. 60 tablet 3  . fluticasone (FLONASE) 50 MCG/ACT nasal spray Place 2 sprays into both nostrils daily.    . montelukast (SINGULAIR) 10 MG tablet Take 10 mg by mouth daily.  3  . rivaroxaban (XARELTO) 20 MG TABS tablet Take 1 tablet (20 mg total) by mouth daily. 30 tablet 3  . XARELTO 20 MG TABS tablet TAKE ONE TABLET BY MOUTH ONCE DAILY 30 tablet 0   No current facility-administered medications for this visit.     Allergies:  Allergies  Allergen Reactions  . Shellfish-Derived Products Swelling    Shrimp  . Bactrim [Sulfamethoxazole-Trimethoprim] Other (See Comments)    Headache   . Ciprofloxacin Other (See Comments)    Felt like "bugs crawling" when on Lovenox  . Percocet [Oxycodone-Acetaminophen] Nausea And Vomiting  . Latex Rash  . Penicillins Rash    Past Medical History, Surgical history, Social history, and Family History were reviewed and updated.  Physical Exam: unknown if currently breastfeeding. ECOG: ECOG 0 General appearance: alert, healthy-appearing woman without distress. Head: Normocephalic, without obvious abnormality Neck: no adenopathy Lymph nodes: Cervical, supraclavicular, and axillary nodes normal. Heart:regular rate and rhythm, S1, S2 normal, no murmur, click, rub or gallop Lung:chest clear, no wheezing, rales, normal symmetric air  entry Abdomen: soft, non-tender, without masses or organomegaly EXT:no erythema, induration, or nodules. Slight increase in her left lower extremity edema.   Lab Results: Lab Results  Component Value Date   WBC 9.1 08/27/2015   HGB 13.8 08/27/2015   HCT 41.5  08/27/2015   MCV 85.4 08/27/2015   PLT 268 08/27/2015     Chemistry      Component Value Date/Time   NA 139 11/19/2014 0806   NA 133* 07/01/2013 1115   K 4.2 11/19/2014 0806   K 3.5 07/01/2013 1115   CL 99 07/01/2013 1115   CO2 30* 11/19/2014 0806   CO2 22 07/01/2013 1115   BUN 7.7 11/19/2014 0806   BUN 4* 07/01/2013 1115   CREATININE 0.8 11/19/2014 0806   CREATININE 0.57 07/01/2013 1115      Component Value Date/Time   CALCIUM 8.9 11/19/2014 0806   CALCIUM 9.1 07/01/2013 1115   ALKPHOS 108 11/19/2014 0806   ALKPHOS 194* 07/01/2013 1115   AST 10 11/19/2014 0806   AST 11 07/01/2013 1115   ALT 11 11/19/2014 0806   ALT 7 07/01/2013 1115   BILITOT 0.59 11/19/2014 0806   BILITOT 0.5 07/01/2013 1115     Iron/TIBC/Ferritin/ %Sat    Component Value Date/Time   IRON 31* 04/30/2015 1506   TIBC 356 04/30/2015 1506   FERRITIN 12 04/30/2015 1506   IRONPCTSAT 9* 04/30/2015 1506    Impression and Plan:  30 year-old woman with:  1. History of pulmonary embolism in the setting of a factor V Leiden heterozygous mutation. She was anticoagulated with Xarelto at that time. She will probably need lifetime anticoagulation at this time.   2.Anemia:  Related to heavy menstrual bleeding. Status post IV iron infusion in October 2016 after her iron level was 31 and September 2016. Iron studies are pending today. Her hemoglobin normalized today. We will continue supplement IV iron as needed. Will call her with iron studies when available. Recheck labs in 2 months.   3. Fatigue, weight gain and irregular menstrual cycles: Likely related to thyroid disease and currently on thyroid supplements followed by her primary care physician regarding that.  4. Lower extremity edema: Likely related to her history of DVT and I have encouraged her to use impression stocking to help with that.  Follow-up in 2 months.   [name], DNP, AGPCNP-BC, AOCNP 08/27/2015

## 2015-08-30 ENCOUNTER — Telehealth: Payer: Self-pay | Admitting: *Deleted

## 2015-08-30 NOTE — Telephone Encounter (Signed)
Per Dr. Alen Blew, I informed patient of her iron study results. No IV iron needed at this time. Patient verbalized understanding.

## 2015-10-12 ENCOUNTER — Encounter: Payer: Self-pay | Admitting: Oncology

## 2015-10-12 NOTE — Telephone Encounter (Signed)
1003 phone call from patient in refernce to Xarelto and need for laproscopic gall bladder surgery.  "I see the surgeon today and need to know if I need to stop xarelto.  How many days do I have to come off xarelto."  Verbal telephone order received and read back from Dr. Alen Blew for Xarelto to be stopped a week before surgery and may resume the day after.  Order given to [place] at this time.  Expressed she keeps going to the hospital and being sent home with instructions to F/U with surgeon.  Asked what to do as this surgery is performed only on Fridays.  Advised she not eat fatty foods and FF.  "I can't even eat Jello without problems."

## 2015-10-15 DIAGNOSIS — K819 Cholecystitis, unspecified: Secondary | ICD-10-CM | POA: Insufficient documentation

## 2015-10-18 DIAGNOSIS — K811 Chronic cholecystitis: Secondary | ICD-10-CM | POA: Insufficient documentation

## 2015-10-26 ENCOUNTER — Telehealth: Payer: Self-pay | Admitting: Oncology

## 2015-10-26 ENCOUNTER — Other Ambulatory Visit (HOSPITAL_BASED_OUTPATIENT_CLINIC_OR_DEPARTMENT_OTHER): Payer: BLUE CROSS/BLUE SHIELD

## 2015-10-26 ENCOUNTER — Telehealth: Payer: Self-pay | Admitting: *Deleted

## 2015-10-26 ENCOUNTER — Ambulatory Visit (HOSPITAL_BASED_OUTPATIENT_CLINIC_OR_DEPARTMENT_OTHER): Payer: BLUE CROSS/BLUE SHIELD | Admitting: Oncology

## 2015-10-26 VITALS — BP 107/72 | HR 91 | Temp 98.0°F | Resp 18 | Ht 66.0 in | Wt 250.0 lb

## 2015-10-26 DIAGNOSIS — D500 Iron deficiency anemia secondary to blood loss (chronic): Secondary | ICD-10-CM | POA: Diagnosis not present

## 2015-10-26 DIAGNOSIS — Z86711 Personal history of pulmonary embolism: Secondary | ICD-10-CM

## 2015-10-26 DIAGNOSIS — N921 Excessive and frequent menstruation with irregular cycle: Secondary | ICD-10-CM

## 2015-10-26 DIAGNOSIS — D509 Iron deficiency anemia, unspecified: Secondary | ICD-10-CM

## 2015-10-26 DIAGNOSIS — D6851 Activated protein C resistance: Secondary | ICD-10-CM

## 2015-10-26 LAB — CBC WITH DIFFERENTIAL/PLATELET
BASO%: 0.2 % (ref 0.0–2.0)
Basophils Absolute: 0 10*3/uL (ref 0.0–0.1)
EOS%: 1.3 % (ref 0.0–7.0)
Eosinophils Absolute: 0.1 10*3/uL (ref 0.0–0.5)
HCT: 42.1 % (ref 34.8–46.6)
HEMOGLOBIN: 14 g/dL (ref 11.6–15.9)
LYMPH%: 16.8 % (ref 14.0–49.7)
MCH: 28.8 pg (ref 25.1–34.0)
MCHC: 33.3 g/dL (ref 31.5–36.0)
MCV: 86.6 fL (ref 79.5–101.0)
MONO#: 0.6 10*3/uL (ref 0.1–0.9)
MONO%: 7 % (ref 0.0–14.0)
NEUT%: 74.7 % (ref 38.4–76.8)
NEUTROS ABS: 6.1 10*3/uL (ref 1.5–6.5)
PLATELETS: 285 10*3/uL (ref 145–400)
RBC: 4.86 10*6/uL (ref 3.70–5.45)
RDW: 12.6 % (ref 11.2–14.5)
WBC: 8.2 10*3/uL (ref 3.9–10.3)
lymph#: 1.4 10*3/uL (ref 0.9–3.3)

## 2015-10-26 LAB — IRON AND TIBC
%SAT: 31 % (ref 21–57)
Iron: 90 ug/dL (ref 41–142)
TIBC: 289 ug/dL (ref 236–444)
UIBC: 199 ug/dL (ref 120–384)

## 2015-10-26 LAB — FERRITIN: FERRITIN: 97 ng/mL (ref 9–269)

## 2015-10-26 NOTE — Progress Notes (Signed)
Hematology and Oncology Follow Up Visit  Robin Key [id number] Jan 17, 1986 30 y.o. 10/26/2015 10:22 AM Robin Key, Robin Melena, NP   Principle Diagnosis: 30 year old woman with:  1. History of pulmonary embolism in the setting of a heterozygous factor V Leiden mutation. This was diagnosed in 2012. She had previous deep vein thrombosis that was treated with Xarelto.  2. 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  Prior Therapy:  Full dose anticoagulation with Lovenox after she had a C-section on 07/01/2013.  She is status post IV iron in the form of Feraheme on multiple occasions most recent which is in October 2016.  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  Interim History:  [name] presents today for a followup visit. Since the last visit, she underwent cholecystectomy for cholecystitis last week and have recovered fairly well. She reported right upper quadrant abdominal pain and discoloration of her urine and subsequently diagnosed with cholecystitis and underwent the operation without any incident. Her Xarelto was stopped temporarily and was resumed 24 hours after her operation. She denied any excessive bleeding or thrombosis related to that. She reports that she is planning to resume work-related duties in the near future.  She does not report any other bleeding episodes but does report heavy menses lasting 7 days every month. She reports Provera have helped to decrease the amount of bleeding. She denied any complications related to Xarelto.  She denies any further migraine headaches. She does not report any blurry vision or syncope. She does not report any fevers, chills, sweats or weight loss. Appetite is back to normal. She does not report any chest pain or dyspnea on exertion. She does not report any nausea or vomiting or abdominal pain. Did not report any neurological symptoms. Remainder of  her review of systems unremarkable.   Medications: I have reviewed the patient's current medications.  Current Outpatient Prescriptions  Medication Sig Dispense Refill  . butalbital-acetaminophen-caffeine (FIORICET, ESGIC) 50-325-40 MG per tablet Take 2 tablets by mouth every 6 (six) hours as needed for headache. 30 tablet 1  . cetirizine (ZYRTEC) 10 MG tablet Take 10 mg by mouth daily.    . ferrous sulfate 325 (65 FE) MG tablet Take 1 tablet (325 mg total) by mouth 2 (two) times daily with a meal. 60 tablet 3  . fluticasone (FLONASE) 50 MCG/ACT nasal spray Place 2 sprays into both nostrils daily.    . montelukast (SINGULAIR) 10 MG tablet Take 10 mg by mouth daily.  3  . NP THYROID 60 MG tablet Take 1 tablet by mouth daily.  1  . ondansetron (ZOFRAN-ODT) 8 MG disintegrating tablet Take 1 tablet by mouth 3 (three) times daily as needed.  0  . promethazine (PHENERGAN) 25 MG tablet Take 25 mg by mouth every 6 (six) hours as needed. for nausea  0  . rivaroxaban (XARELTO) 20 MG TABS tablet Take 1 tablet (20 mg total) by mouth daily. 30 tablet 3   No current facility-administered medications for this visit.     Allergies:  Allergies  Allergen Reactions  . Ciprofloxacin Other (See Comments)    Felt like "bugs crawling" when on Lovenox  . Shellfish-Derived Products Swelling    Shrimp  . Bactrim [Sulfamethoxazole-Trimethoprim] Other (See Comments)    Headache   . Percocet [Oxycodone-Acetaminophen] Nausea And Vomiting  . Latex Rash  . Penicillins Rash    Past Medical History, Surgical history, Social history, and Family History were  reviewed and updated.  Physical Exam: Blood pressure 107/72, pulse 91, temperature 98 F (36.7 C), temperature source Oral, resp. rate 18, height 5\' 6"  (1.676 m), weight 250 lb (113.399 kg), SpO2 100 %, unknown if currently breastfeeding. ECOG: ECOG 0 General appearance: alert, awake woman without distress. Head: Normocephalic, without obvious abnormality  no oral ulcers or lesions. Neck: no adenopathy Lymph nodes: Cervical, supraclavicular, and axillary nodes normal. Heart:regular rate and rhythm, S1, S2 normal, no murmur, click, rub or gallop Lung:chest clear, no wheezing, rales, normal symmetric air entry Abdomen: soft, non-tender, without masses or organomegaly no shifting dullness or ascites. EXT:no erythema, induration, or nodules.    Lab Results: Lab Results  Component Value Date   WBC 8.2 10/26/2015   HGB 14.0 10/26/2015   HCT 42.1 10/26/2015   MCV 86.6 10/26/2015   PLT 285 10/26/2015     Chemistry      Component Value Date/Time   NA 139 08/27/2015 1411   NA 133* 07/01/2013 1115   K 3.9 08/27/2015 1411   K 3.5 07/01/2013 1115   CL 99 07/01/2013 1115   CO2 28 08/27/2015 1411   CO2 22 07/01/2013 1115   BUN 11.5 08/27/2015 1411   BUN 4* 07/01/2013 1115   CREATININE 0.9 08/27/2015 1411   CREATININE 0.57 07/01/2013 1115      Component Value Date/Time   CALCIUM 9.7 08/27/2015 1411   CALCIUM 9.1 07/01/2013 1115   ALKPHOS 100 08/27/2015 1411   ALKPHOS 194* 07/01/2013 1115   AST 17 08/27/2015 1411   AST 11 07/01/2013 1115   ALT 18 08/27/2015 1411   ALT 7 07/01/2013 1115   BILITOT 0.79 08/27/2015 1411   BILITOT 0.5 07/01/2013 1115     Iron/TIBC/Ferritin/ %Sat    Component Value Date/Time   IRON 27* 08/27/2015 1411   TIBC 291 08/27/2015 1411   FERRITIN 86 08/27/2015 1411   IRONPCTSAT 9* 08/27/2015 1411    Impression and Plan:  30 year-old woman with:  1. History of pulmonary embolism in the setting of a factor V Leiden heterozygous mutation. She was anticoagulated with Xarelto at that time.   Risks and benefits of continuing Xarelto versus discontinuation at this time. She feels uncomfortable stopping this medication given her thrombosis history. For the time being I have advised to continue on the same dose and schedule.   2.Anemia:  Related to heavy menstrual bleeding. Status post IV iron infusion in  October 2016. Iron studies in January 2017 were reviewed and showed mild iron deficiency. She has tolerated IV iron well in the past and have her iron stores are depleted, we'll replace with IV iron given her poor tolerance to oral iron.   3. Recent gallbladder surgery: No complications related to this at this time.  4. Follow-up: Will be in 3 months to recheck her iron studies.   [facility] MD 10/26/2015

## 2015-10-26 NOTE — Telephone Encounter (Signed)
per pof to sch pt appt-gave pt copy of avs °

## 2015-10-26 NOTE — Telephone Encounter (Signed)
Per staff phone call and POF I have schedueld appts. Scheduler advised of appts.  JMW  

## 2015-11-03 ENCOUNTER — Encounter: Payer: Self-pay | Admitting: *Deleted

## 2015-11-05 ENCOUNTER — Ambulatory Visit: Payer: BLUE CROSS/BLUE SHIELD

## 2015-11-12 ENCOUNTER — Ambulatory Visit: Payer: BLUE CROSS/BLUE SHIELD

## 2015-12-09 ENCOUNTER — Other Ambulatory Visit: Payer: Self-pay | Admitting: Obstetrics and Gynecology

## 2015-12-09 DIAGNOSIS — L299 Pruritus, unspecified: Secondary | ICD-10-CM

## 2015-12-09 DIAGNOSIS — R234 Changes in skin texture: Secondary | ICD-10-CM

## 2015-12-17 ENCOUNTER — Ambulatory Visit
Admission: RE | Admit: 2015-12-17 | Discharge: 2015-12-17 | Disposition: A | Discharge status: Home / Self Care | Payer: BLUE CROSS/BLUE SHIELD | Source: Ambulatory Visit | Attending: Obstetrics and Gynecology | Admitting: Obstetrics and Gynecology

## 2015-12-17 DIAGNOSIS — L299 Pruritus, unspecified: Secondary | ICD-10-CM

## 2015-12-20 ENCOUNTER — Telehealth: Payer: Self-pay | Admitting: Oncology

## 2015-12-20 NOTE — Telephone Encounter (Signed)
Robin Key from [employer] (ref # [id number] to verify that pt is being seen and confirm fax number

## 2016-01-14 ENCOUNTER — Other Ambulatory Visit: Payer: Self-pay | Admitting: Oncology

## 2016-01-24 ENCOUNTER — Telehealth: Payer: Self-pay | Admitting: Oncology

## 2016-01-24 NOTE — Telephone Encounter (Signed)
pt called to r/s appt...done....pt ok and aware °

## 2016-01-26 ENCOUNTER — Other Ambulatory Visit: Payer: BLUE CROSS/BLUE SHIELD

## 2016-01-26 ENCOUNTER — Ambulatory Visit: Payer: BLUE CROSS/BLUE SHIELD | Admitting: Oncology

## 2016-02-07 ENCOUNTER — Other Ambulatory Visit: Payer: Self-pay | Admitting: Oncology

## 2016-02-07 DIAGNOSIS — D649 Anemia, unspecified: Secondary | ICD-10-CM

## 2016-02-07 DIAGNOSIS — Z86718 Personal history of other venous thrombosis and embolism: Secondary | ICD-10-CM

## 2016-02-07 DIAGNOSIS — D6851 Activated protein C resistance: Secondary | ICD-10-CM

## 2016-03-03 ENCOUNTER — Ambulatory Visit: Payer: BLUE CROSS/BLUE SHIELD | Admitting: Oncology

## 2016-03-03 ENCOUNTER — Other Ambulatory Visit: Payer: BLUE CROSS/BLUE SHIELD

## 2016-03-03 ENCOUNTER — Telehealth: Payer: Self-pay | Admitting: Oncology

## 2016-03-03 NOTE — Telephone Encounter (Signed)
PT CALLED TO CANCEL APT TODAY & RESCHED FOR 8/9

## 2016-03-13 ENCOUNTER — Other Ambulatory Visit: Payer: Self-pay | Admitting: Oncology

## 2016-03-15 ENCOUNTER — Other Ambulatory Visit (HOSPITAL_BASED_OUTPATIENT_CLINIC_OR_DEPARTMENT_OTHER): Payer: BLUE CROSS/BLUE SHIELD

## 2016-03-15 ENCOUNTER — Ambulatory Visit (HOSPITAL_BASED_OUTPATIENT_CLINIC_OR_DEPARTMENT_OTHER): Payer: BLUE CROSS/BLUE SHIELD | Admitting: Oncology

## 2016-03-15 ENCOUNTER — Telehealth: Payer: Self-pay | Admitting: Oncology

## 2016-03-15 VITALS — BP 118/56 | HR 78 | Temp 98.5°F | Resp 18 | Wt 268.4 lb

## 2016-03-15 DIAGNOSIS — N912 Amenorrhea, unspecified: Secondary | ICD-10-CM | POA: Diagnosis not present

## 2016-03-15 DIAGNOSIS — D6851 Activated protein C resistance: Secondary | ICD-10-CM

## 2016-03-15 DIAGNOSIS — D509 Iron deficiency anemia, unspecified: Secondary | ICD-10-CM

## 2016-03-15 DIAGNOSIS — D649 Anemia, unspecified: Secondary | ICD-10-CM

## 2016-03-15 LAB — CBC WITH DIFFERENTIAL/PLATELET
BASO%: 0.9 % (ref 0.0–2.0)
BASOS ABS: 0.1 10*3/uL (ref 0.0–0.1)
EOS%: 0.7 % (ref 0.0–7.0)
Eosinophils Absolute: 0.1 10*3/uL (ref 0.0–0.5)
HEMATOCRIT: 39.6 % (ref 34.8–46.6)
HGB: 13 g/dL (ref 11.6–15.9)
LYMPH#: 1.7 10*3/uL (ref 0.9–3.3)
LYMPH%: 22.6 % (ref 14.0–49.7)
MCH: 26.6 pg (ref 25.1–34.0)
MCHC: 32.7 g/dL (ref 31.5–36.0)
MCV: 81.3 fL (ref 79.5–101.0)
MONO#: 0.4 10*3/uL (ref 0.1–0.9)
MONO%: 5.7 % (ref 0.0–14.0)
NEUT#: 5.1 10*3/uL (ref 1.5–6.5)
NEUT%: 70.1 % (ref 38.4–76.8)
PLATELETS: 264 10*3/uL (ref 145–400)
RBC: 4.87 10*6/uL (ref 3.70–5.45)
RDW: 13.2 % (ref 11.2–14.5)
WBC: 7.3 10*3/uL (ref 3.9–10.3)

## 2016-03-15 MED ORDER — RIVAROXABAN 20 MG PO TABS: 20.0000 mg | ORAL_TABLET | Freq: Every day | ORAL | 0 refills | Status: DC

## 2016-03-15 NOTE — Progress Notes (Signed)
Hematology and Oncology Follow Up Visit  Robin Key [id number] Dec 08, 1985 30 y.o. 03/15/2016 3:56 PM Robin Key, [name], Robin Melena, NP   Principle Diagnosis: 30 year old woman with:  1. History of pulmonary embolism in the setting of a heterozygous factor V Leiden mutation. This was diagnosed in 2012. She had previous deep vein thrombosis that was treated with Xarelto.  2. 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  Prior Therapy:  Full dose anticoagulation with Lovenox after she had a C-section on 07/01/2013.  She is status post IV iron in the form of Feraheme on multiple occasions most recent which is in October 2016.  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  Interim History:  Robin Key presents today for a followup visit. Since the last visit, she reports no major complications or illnesses. She has been off thyroid medication and does not take any iron supplements. She did report some more weight gain and some fatigue but her last menstrual cycle was in June 2017. She remains active and works full time. She denied any hematochezia or Key. She denied any hemoptysis or hematemesis. She did not report any recent thrombosis episodes or hospitalizations.  She does not report any blurry vision or syncope. She does not report any fevers, chills, sweats or weight loss. Appetite is back to normal. She does not report any chest pain or dyspnea on exertion. She does not report any nausea or vomiting or abdominal pain. Did not report any neurological symptoms. Remainder of her review of systems unremarkable.   Medications: I have reviewed the patient's current medications.  Current Outpatient Prescriptions  Medication Sig Dispense Refill  . butalbital-acetaminophen-caffeine (FIORICET, ESGIC) 50-325-40 MG per tablet Take 2 tablets by mouth every 6 (six) hours as needed for headache. 30 tablet 1  . cetirizine  (ZYRTEC) 10 MG tablet Take 10 mg by mouth daily.    . ondansetron (ZOFRAN-ODT) 8 MG disintegrating tablet Take 1 tablet by mouth 3 (three) times daily as needed.  0  . promethazine (PHENERGAN) 25 MG tablet Take 25 mg by mouth every 6 (six) hours as needed. for nausea  0  . rivaroxaban (XARELTO) 20 MG TABS tablet Take 1 tablet (20 mg total) by mouth daily. 30 tablet 0  . fluticasone (FLONASE) 50 MCG/ACT nasal spray Place 2 sprays into both nostrils daily.     No current facility-administered medications for this visit.      Allergies:  Allergies  Allergen Reactions  . Ciprofloxacin Other (See Comments)    Felt like "bugs crawling" when on Lovenox  . Shellfish-Derived Products Swelling    Shrimp  . Bactrim [Sulfamethoxazole-Trimethoprim] Other (See Comments)    Headache   . Percocet [Oxycodone-Acetaminophen] Nausea And Vomiting  . Latex Rash  . Penicillins Rash    Past Medical History, Surgical history, Social history, and Family History were reviewed and updated.  Physical Exam: Blood pressure (!) 118/56, pulse 78, temperature 98.5 F (36.9 C), temperature source Oral, resp. rate 18, weight 268 lb 6.4 oz (121.7 kg), SpO2 100 %, unknown if currently breastfeeding. ECOG: 0 General appearance: Alert, awake woman without distress. Head: Normocephalic, without obvious abnormality no oral thrush noted. Neck: no adenopathy Lymph nodes: Cervical, supraclavicular, and axillary nodes normal. Heart:regular rate and rhythm, S1, S2 normal, no murmur, click, rub or gallop Lung:chest clear, no wheezing, rales, normal symmetric air entry Abdomen: soft, non-tender, without masses or organomegaly no rebound or guarding. EXT:no erythema, induration, or nodules.  Lab Results: Lab Results  Component Value Date   WBC 7.3 03/15/2016   HGB 13.0 03/15/2016   HCT 39.6 03/15/2016   MCV 81.3 03/15/2016   PLT 264 03/15/2016     Chemistry      Component Value Date/Time   NA 139 08/27/2015  1411   K 3.9 08/27/2015 1411   CL 99 07/01/2013 1115   CO2 28 08/27/2015 1411   BUN 11.5 08/27/2015 1411   CREATININE 0.9 08/27/2015 1411      Component Value Date/Time   CALCIUM 9.7 08/27/2015 1411   ALKPHOS 100 08/27/2015 1411   AST 17 08/27/2015 1411   ALT 18 08/27/2015 1411   BILITOT 0.79 08/27/2015 1411     Iron/TIBC/Ferritin/ %Sat    Component Value Date/Time   IRON 90 10/26/2015 0956   TIBC 289 10/26/2015 0956   FERRITIN 97 10/26/2015 0956   IRONPCTSAT 31 10/26/2015 0956    Impression and Plan:  30 year old woman with:  1. History of pulmonary embolism in the setting of a factor V Leiden heterozygous mutation. She was anticoagulated with Xarelto at that time. She has no objections to continuing this therapy long term. She understands that if she becomes pregnant, switching her to Lovenox throughout her pregnancy would be the alternative.   2.Anemia:  Related to iron deficiency but her hemoglobin is normal. Iron studies will be repeated and intravenous iron will be given as needed.   3. Absent menstrual cycles: She is afraid that she could be pregnant and requesting a pregnancy test. We will obtain a beta-hCG testing and inform her with results as soon as possible. If she is pregnant, has Xarelto knees to be changed as discussed.  4. Follow-up: Will be in 3 months to recheck her iron studies.   [facility], MD 03/15/16

## 2016-03-15 NOTE — Telephone Encounter (Signed)
per pof to sch pt appt-gave pt copy of avs °

## 2016-03-16 ENCOUNTER — Telehealth: Payer: Self-pay | Admitting: *Deleted

## 2016-03-16 ENCOUNTER — Other Ambulatory Visit: Payer: Self-pay | Admitting: Oncology

## 2016-03-16 LAB — IRON AND TIBC
%SAT: 10 % — ABNORMAL LOW (ref 21–57)
Iron: 32 ug/dL — ABNORMAL LOW (ref 41–142)
TIBC: 320 ug/dL (ref 236–444)
UIBC: 289 ug/dL (ref 120–384)

## 2016-03-16 LAB — HCG, SERUM, QUALITATIVE: hCG,Beta Subunit,Qual,Serum: NEGATIVE m[IU]/mL (ref <6)

## 2016-03-16 LAB — FERRITIN: FERRITIN: 32 ng/mL (ref 9–269)

## 2016-03-16 NOTE — Telephone Encounter (Signed)
As noted below by Dr. Alen Blew, I informed her that her pregnancy test is negative. Iron levels are going down and she will need IV iron soon. Patient verbalized understanding.

## 2016-03-16 NOTE — Telephone Encounter (Signed)
-----   Message from Wyatt Portela, MD sent at 03/16/2016 10:45 AM EDT ----- Please let her know:  1. Pregnancy test is negative.  2. Iron levels are going down and will need IV iron soon. Let me know when she wants to get it.

## 2016-03-16 NOTE — Telephone Encounter (Signed)
I will send a message to scheduling to get her iron infusion scheduled on 8/18 and 8/25.

## 2016-03-17 ENCOUNTER — Telehealth: Payer: Self-pay | Admitting: Oncology

## 2016-03-17 NOTE — Telephone Encounter (Signed)
Spoke with pt to confirm appts on 8/18 and 8/25 per los

## 2016-03-24 ENCOUNTER — Ambulatory Visit (HOSPITAL_BASED_OUTPATIENT_CLINIC_OR_DEPARTMENT_OTHER): Payer: BLUE CROSS/BLUE SHIELD

## 2016-03-24 VITALS — BP 114/65 | HR 73 | Temp 98.9°F | Resp 18

## 2016-03-24 DIAGNOSIS — D649 Anemia, unspecified: Secondary | ICD-10-CM

## 2016-03-24 DIAGNOSIS — D509 Iron deficiency anemia, unspecified: Secondary | ICD-10-CM

## 2016-03-24 MED ORDER — DIPHENHYDRAMINE HCL 25 MG PO CAPS
ORAL_CAPSULE | ORAL | Status: AC
  Filled 2016-03-24: qty 2

## 2016-03-24 MED ORDER — DIPHENHYDRAMINE HCL 25 MG PO CAPS
50.0000 mg | ORAL_CAPSULE | Freq: Once | ORAL | Status: AC
  Administered 2016-03-24: 50 mg via ORAL

## 2016-03-24 MED ORDER — SODIUM CHLORIDE 0.9 % IV SOLN
Freq: Once | INTRAVENOUS | Status: AC
  Administered 2016-03-24: 15:00:00 via INTRAVENOUS

## 2016-03-24 MED ORDER — FERUMOXYTOL INJECTION 510 MG/17 ML
510.0000 mg | Freq: Once | INTRAVENOUS | Status: AC
  Administered 2016-03-24: 510 mg via INTRAVENOUS
  Filled 2016-03-24: qty 17

## 2016-03-24 NOTE — Patient Instructions (Signed)

## 2016-03-24 NOTE — Progress Notes (Signed)
Vital signs obtained after infusion, did not want to stay for the 30 minute post observation.

## 2016-03-31 ENCOUNTER — Ambulatory Visit (HOSPITAL_BASED_OUTPATIENT_CLINIC_OR_DEPARTMENT_OTHER): Payer: BLUE CROSS/BLUE SHIELD

## 2016-03-31 DIAGNOSIS — D509 Iron deficiency anemia, unspecified: Secondary | ICD-10-CM

## 2016-03-31 DIAGNOSIS — D649 Anemia, unspecified: Secondary | ICD-10-CM

## 2016-03-31 MED ORDER — DIPHENHYDRAMINE HCL 25 MG PO CAPS
50.0000 mg | ORAL_CAPSULE | Freq: Once | ORAL | Status: AC
  Administered 2016-03-31: 50 mg via ORAL

## 2016-03-31 MED ORDER — SODIUM CHLORIDE 0.9 % IV SOLN
Freq: Once | INTRAVENOUS | Status: AC
  Administered 2016-03-31: 16:00:00 via INTRAVENOUS

## 2016-03-31 MED ORDER — SODIUM CHLORIDE 0.9 % IV SOLN
510.0000 mg | Freq: Once | INTRAVENOUS | Status: AC
  Administered 2016-03-31: 510 mg via INTRAVENOUS
  Filled 2016-03-31: qty 17

## 2016-03-31 NOTE — Progress Notes (Signed)
Dr. Alen Blew not in office today. Pt requested to have labs rechecked next Friday because of heavy menstrual bleeding this week. RN Erline Levine informed and verbalized that she would notify MD of request and confirm with pt if MD okay with plan. Pt refused to stay 25min post infusion for observation. Educated about potential side effects and purpose of post feraheme observation. Pt verbalized understanding.

## 2016-03-31 NOTE — Patient Instructions (Signed)

## 2016-04-03 ENCOUNTER — Telehealth: Payer: Self-pay | Admitting: *Deleted

## 2016-04-03 ENCOUNTER — Encounter: Payer: Self-pay | Admitting: *Deleted

## 2016-04-03 NOTE — Telephone Encounter (Signed)
Patient calling to request a cbc on 04/07/16 d/t heavy menstrual flow. Note to dr Alen Blew

## 2016-04-04 ENCOUNTER — Encounter: Payer: Self-pay | Admitting: *Deleted

## 2016-04-04 ENCOUNTER — Other Ambulatory Visit: Payer: Self-pay | Admitting: *Deleted

## 2016-04-04 DIAGNOSIS — D649 Anemia, unspecified: Secondary | ICD-10-CM

## 2016-04-04 NOTE — Progress Notes (Unsigned)
Patient would like to come in after 2 pm  On 04/07/16 to have a cbc drawn. States she has had a very, heavy  Cycle this month.

## 2016-04-04 NOTE — Progress Notes (Signed)
CBC Ok to be done.

## 2016-04-07 ENCOUNTER — Other Ambulatory Visit (HOSPITAL_BASED_OUTPATIENT_CLINIC_OR_DEPARTMENT_OTHER): Payer: BLUE CROSS/BLUE SHIELD

## 2016-04-07 DIAGNOSIS — D509 Iron deficiency anemia, unspecified: Secondary | ICD-10-CM | POA: Diagnosis not present

## 2016-04-07 DIAGNOSIS — D649 Anemia, unspecified: Secondary | ICD-10-CM

## 2016-04-07 LAB — CBC WITH DIFFERENTIAL/PLATELET
BASO%: 1 % (ref 0.0–2.0)
Basophils Absolute: 0.1 10*3/uL (ref 0.0–0.1)
EOS ABS: 0.1 10*3/uL (ref 0.0–0.5)
EOS%: 1 % (ref 0.0–7.0)
HEMATOCRIT: 40 % (ref 34.8–46.6)
HGB: 13.1 g/dL (ref 11.6–15.9)
LYMPH#: 1.7 10*3/uL (ref 0.9–3.3)
LYMPH%: 23.2 % (ref 14.0–49.7)
MCH: 27.2 pg (ref 25.1–34.0)
MCHC: 32.7 g/dL (ref 31.5–36.0)
MCV: 83 fL (ref 79.5–101.0)
MONO#: 0.5 10*3/uL (ref 0.1–0.9)
MONO%: 6.1 % (ref 0.0–14.0)
NEUT%: 68.7 % (ref 38.4–76.8)
NEUTROS ABS: 5.2 10*3/uL (ref 1.5–6.5)
PLATELETS: 254 10*3/uL (ref 145–400)
RBC: 4.81 10*6/uL (ref 3.70–5.45)
RDW: 15 % — ABNORMAL HIGH (ref 11.2–14.5)
WBC: 7.5 10*3/uL (ref 3.9–10.3)

## 2016-05-09 ENCOUNTER — Other Ambulatory Visit: Payer: Self-pay | Admitting: Oncology

## 2016-05-12 ENCOUNTER — Other Ambulatory Visit: Payer: Self-pay | Admitting: Oncology

## 2016-06-16 ENCOUNTER — Other Ambulatory Visit (HOSPITAL_BASED_OUTPATIENT_CLINIC_OR_DEPARTMENT_OTHER): Payer: BLUE CROSS/BLUE SHIELD

## 2016-06-16 ENCOUNTER — Ambulatory Visit (HOSPITAL_BASED_OUTPATIENT_CLINIC_OR_DEPARTMENT_OTHER): Payer: BLUE CROSS/BLUE SHIELD | Admitting: Oncology

## 2016-06-16 VITALS — BP 117/76 | HR 83 | Temp 98.0°F | Resp 18 | Ht 66.0 in | Wt 269.5 lb

## 2016-06-16 DIAGNOSIS — D500 Iron deficiency anemia secondary to blood loss (chronic): Secondary | ICD-10-CM | POA: Diagnosis not present

## 2016-06-16 DIAGNOSIS — Z23 Encounter for immunization: Secondary | ICD-10-CM

## 2016-06-16 DIAGNOSIS — N926 Irregular menstruation, unspecified: Secondary | ICD-10-CM

## 2016-06-16 DIAGNOSIS — D649 Anemia, unspecified: Secondary | ICD-10-CM

## 2016-06-16 DIAGNOSIS — D6851 Activated protein C resistance: Secondary | ICD-10-CM

## 2016-06-16 DIAGNOSIS — D509 Iron deficiency anemia, unspecified: Secondary | ICD-10-CM

## 2016-06-16 DIAGNOSIS — Z86711 Personal history of pulmonary embolism: Secondary | ICD-10-CM

## 2016-06-16 LAB — CBC WITH DIFFERENTIAL/PLATELET
BASO%: 1.1 % (ref 0.0–2.0)
BASOS ABS: 0.1 10*3/uL (ref 0.0–0.1)
EOS ABS: 0.1 10*3/uL (ref 0.0–0.5)
EOS%: 1.7 % (ref 0.0–7.0)
HEMATOCRIT: 39.1 % (ref 34.8–46.6)
HEMOGLOBIN: 13 g/dL (ref 11.6–15.9)
LYMPH#: 1.7 10*3/uL (ref 0.9–3.3)
LYMPH%: 20.4 % (ref 14.0–49.7)
MCH: 28.6 pg (ref 25.1–34.0)
MCHC: 33.2 g/dL (ref 31.5–36.0)
MCV: 86.1 fL (ref 79.5–101.0)
MONO#: 0.6 10*3/uL (ref 0.1–0.9)
MONO%: 6.6 % (ref 0.0–14.0)
NEUT#: 6 10*3/uL (ref 1.5–6.5)
NEUT%: 70.2 % (ref 38.4–76.8)
PLATELETS: 238 10*3/uL (ref 145–400)
RBC: 4.54 10*6/uL (ref 3.70–5.45)
RDW: 14.7 % — AB (ref 11.2–14.5)
WBC: 8.6 10*3/uL (ref 3.9–10.3)

## 2016-06-16 MED ORDER — INFLUENZA VAC SPLIT QUAD 0.5 ML IM SUSY
0.5000 mL | PREFILLED_SYRINGE | Freq: Once | INTRAMUSCULAR | Status: AC
  Administered 2016-06-16: 0.5 mL via INTRAMUSCULAR
  Filled 2016-06-16: qty 0.5

## 2016-06-16 MED ORDER — RIVAROXABAN 20 MG PO TABS: 20.0000 mg | ORAL_TABLET | Freq: Every day | ORAL | 3 refills | Status: DC

## 2016-06-16 NOTE — Progress Notes (Signed)
Hematology and Oncology Follow Up Visit  Robin Key [id number] [birth year] 30 y.o. 06/16/2016 3:04 PM Robin Key, Robin Melena, NP   Principle Diagnosis: 30 year old woman with:  1. History of pulmonary embolism in the setting of a heterozygous factor V Leiden mutation. This was diagnosed in 2012. She had previous deep vein thrombosis that was treated with Xarelto.  2. 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  Prior Therapy:  Full dose anticoagulation with Lovenox after she had a C-section on 07/01/2013.  She is status post IV iron in the form of Feraheme on multiple occasions most recent which is in August 2017.  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  Interim History:  Robin Key presents today for a followup visit. Since the last visit, she reports no major changes in her health. She did report to the periodic fatigue and tiredness in August 2017 and required intravenous iron after laboratory data showed iron deficiency. She continues to be reasonably active and performs activities of daily living including working full time. She does report fatigue and tiredness associated with her job. She is no longer reporting any menstrual irregularities or hair loss.   She does not report any headaches, blurry vision or syncope. She does not report any fevers, chills, sweats or weight loss. She does not report any chest pain or dyspnea on exertion. She does not report any cough, wheezing or hemoptysis. She does not report any nausea or vomiting or abdominal pain. Did not report any neurological symptoms. Remainder of her review of systems unremarkable.   Medications: I have reviewed the patient's current medications.  Current Outpatient Prescriptions  Medication Sig Dispense Refill  . butalbital-acetaminophen-caffeine (FIORICET, ESGIC) 50-325-40 MG per tablet Take 2 tablets by mouth every 6 (six) hours  as needed for headache. 30 tablet 1  . cetirizine (ZYRTEC) 10 MG tablet Take 10 mg by mouth daily.    . fluticasone (FLONASE) 50 MCG/ACT nasal spray Place 2 sprays into both nostrils daily.    . ondansetron (ZOFRAN-ODT) 8 MG disintegrating tablet Take 1 tablet by mouth 3 (three) times daily as needed.  0  . promethazine (PHENERGAN) 25 MG tablet Take 25 mg by mouth every 6 (six) hours as needed. for nausea  0  . rivaroxaban (XARELTO) 20 MG TABS tablet Take 1 tablet (20 mg total) by mouth daily. 30 tablet 3   No current facility-administered medications for this visit.      Allergies:  Allergies  Allergen Reactions  . Ciprofloxacin Other (See Comments)    Felt like "bugs crawling" when on Lovenox  . Shellfish-Derived Products Swelling    Shrimp  . Bactrim [Sulfamethoxazole-Trimethoprim] Other (See Comments)    Headache   . Percocet [Oxycodone-Acetaminophen] Nausea And Vomiting  . Latex Rash  . Penicillins Rash    Past Medical History, Surgical history, Social history, and Family History were reviewed and updated.  Physical Exam: Blood pressure 117/76, pulse 83, temperature 98 F (36.7 C), temperature source Oral, resp. rate 18, height 5\' 6"  (1.676 m), weight 269 lb 8 oz (122.2 kg), SpO2 100 %, unknown if currently breastfeeding. ECOG: 0 General appearance: Well-appearing woman without distress. Head: Normocephalic, without obvious abnormality no oral thrush noted. Neck: no adenopathy Lymph nodes: Cervical, supraclavicular, and axillary nodes normal. Heart:regular rate and rhythm, S1, S2 normal, no murmur, click, rub or gallop Lung:chest clear, no wheezing, rales, normal symmetric air entry Abdomen: soft, non-tender, without masses or organomegaly no  shifting dullness or ascites. EXT:no erythema, induration, or nodules.    Lab Results: Lab Results  Component Value Date   WBC 8.6 06/16/2016   HGB 13.0 06/16/2016   HCT 39.1 06/16/2016   MCV 86.1 06/16/2016   PLT 238  06/16/2016     Chemistry      Component Value Date/Time   NA 139 08/27/2015 1411   K 3.9 08/27/2015 1411   CL 99 07/01/2013 1115   CO2 28 08/27/2015 1411   BUN 11.5 08/27/2015 1411   CREATININE 0.9 08/27/2015 1411      Component Value Date/Time   CALCIUM 9.7 08/27/2015 1411   ALKPHOS 100 08/27/2015 1411   AST 17 08/27/2015 1411   ALT 18 08/27/2015 1411   BILITOT 0.79 08/27/2015 1411     Iron/TIBC/Ferritin/ %Sat    Component Value Date/Time   IRON 32 (L) 03/15/2016 1519   TIBC 320 03/15/2016 1519   FERRITIN 32 03/15/2016 1519   IRONPCTSAT 10 (L) 03/15/2016 1519    Impression and Plan:  30 year old woman with:  1. History of pulmonary embolism in the setting of a factor V Leiden heterozygous mutation. She is on long term anticoagulation with Xarelto and have tolerated it well.  Risks and benefits of continuing this agent was reviewed and she is agreeable to continue. I have refilled this medication for her at this time.  2.Anemia:  Related to iron deficiency but her hemoglobin is normal. Iron studies will be repeated and intravenous iron will be given as needed.   3. Irregular menstrual cycles: She continues to follow with OB/GYN regarding this issue.  4. Follow-up: Will be in 3 months to recheck her iron studies.   [facility], MD 06/16/16

## 2016-06-19 ENCOUNTER — Telehealth: Payer: Self-pay | Admitting: *Deleted

## 2016-06-19 ENCOUNTER — Other Ambulatory Visit: Payer: Self-pay | Admitting: Oncology

## 2016-06-19 LAB — IRON AND TIBC
%SAT: 16 % — AB (ref 21–57)
IRON: 40 ug/dL — AB (ref 41–142)
TIBC: 254 ug/dL (ref 236–444)
UIBC: 214 ug/dL (ref 120–384)

## 2016-06-19 LAB — FERRITIN: FERRITIN: 162 ng/mL (ref 9–269)

## 2016-06-19 NOTE — Telephone Encounter (Signed)
-----   Message from Wyatt Portela, MD sent at 06/19/2016  9:52 AM EST ----- Please let her know her iron is better but still needs another iron infusion (only one this time). She will be contacted for date and time.

## 2016-06-19 NOTE — Telephone Encounter (Signed)
As noted below by Dr. Alen Blew, I informed patient of her iron level. She understands that someone from scheduling will be calling to arrange an appointment for one more dose of iron. Patient verbalized understanding.

## 2016-06-20 ENCOUNTER — Telehealth: Payer: Self-pay | Admitting: Oncology

## 2016-06-20 NOTE — Telephone Encounter (Signed)
Pt will call office back to schedule iv iron once she receives her work schedule

## 2016-06-22 ENCOUNTER — Telehealth: Payer: Self-pay | Admitting: *Deleted

## 2016-06-22 NOTE — Telephone Encounter (Signed)
Per staff message I have moved appt to 11/17

## 2016-06-23 ENCOUNTER — Ambulatory Visit (HOSPITAL_BASED_OUTPATIENT_CLINIC_OR_DEPARTMENT_OTHER): Payer: BLUE CROSS/BLUE SHIELD

## 2016-06-23 VITALS — BP 106/72 | HR 80 | Temp 98.6°F | Resp 16

## 2016-06-23 DIAGNOSIS — D500 Iron deficiency anemia secondary to blood loss (chronic): Secondary | ICD-10-CM

## 2016-06-23 DIAGNOSIS — D509 Iron deficiency anemia, unspecified: Secondary | ICD-10-CM

## 2016-06-23 MED ORDER — SODIUM CHLORIDE 0.9 % IV SOLN
510.0000 mg | Freq: Once | INTRAVENOUS | Status: AC
  Administered 2016-06-23: 510 mg via INTRAVENOUS
  Filled 2016-06-23: qty 17

## 2016-06-23 MED ORDER — DIPHENHYDRAMINE HCL 25 MG PO CAPS
ORAL_CAPSULE | ORAL | Status: AC
  Filled 2016-06-23: qty 1

## 2016-06-23 MED ORDER — SODIUM CHLORIDE 0.9 % IV SOLN
Freq: Once | INTRAVENOUS | Status: AC
  Administered 2016-06-23: 15:00:00 via INTRAVENOUS

## 2016-06-23 MED ORDER — DIPHENHYDRAMINE HCL 25 MG PO CAPS
25.0000 mg | ORAL_CAPSULE | Freq: Once | ORAL | Status: AC
  Administered 2016-06-23: 25 mg via ORAL

## 2016-06-23 NOTE — Patient Instructions (Signed)
Ferumoxytol injection (Feraheme) What is this medicine? FERUMOXYTOL is an iron complex. Iron is used to make healthy red blood cells, which carry oxygen and nutrients throughout the body. This medicine is used to treat iron deficiency anemia in people with chronic kidney disease. COMMON BRAND NAME(S): Feraheme What should I tell my health care provider before I take this medicine? They need to know if you have any of these conditions: -anemia not caused by low iron levels -high levels of iron in the blood -magnetic resonance imaging (MRI) test scheduled -an unusual or allergic reaction to iron, other medicines, foods, dyes, or preservatives -pregnant or trying to get pregnant -breast-feeding How should I use this medicine? This medicine is for injection into a vein. It is given by a health care professional in a hospital or clinic setting. Talk to your pediatrician regarding the use of this medicine in children. Special care may be needed. What if I miss a dose? It is important not to miss your dose. Call your doctor or health care professional if you are unable to keep an appointment. What may interact with this medicine? This medicine may interact with the following medications: -other iron products What should I watch for while using this medicine? Visit your doctor or healthcare professional regularly. Tell your doctor or healthcare professional if your symptoms do not start to get better or if they get worse. You may need blood work done while you are taking this medicine. You may need to follow a special diet. Talk to your doctor. Foods that contain iron include: whole grains/cereals, dried fruits, beans, or peas, leafy green vegetables, and organ meats (liver, kidney). What side effects may I notice from receiving this medicine? Side effects that you should report to your doctor or health care professional as soon as possible: -allergic reactions like skin rash, itching or hives,  swelling of the face, lips, or tongue -breathing problems -changes in blood pressure -feeling faint or lightheaded, falls -fever or chills -flushing, sweating, or hot feelings -swelling of the ankles or feet Side effects that usually do not require medical attention (report to your doctor or health care professional if they continue or are bothersome): -diarrhea -headache -nausea, vomiting -stomach pain Where should I keep my medicine? This drug is given in a hospital or clinic and will not be stored at home.  2017 Elsevier/Gold Standard (2015-08-26 12:41:49)  

## 2016-09-12 ENCOUNTER — Telehealth: Payer: Self-pay | Admitting: Oncology

## 2016-09-12 NOTE — Telephone Encounter (Signed)
Pt called to r/s lab/ov appt to an afternoon appt due to work schedule. Gave pt next available date/time

## 2016-09-15 ENCOUNTER — Other Ambulatory Visit (HOSPITAL_BASED_OUTPATIENT_CLINIC_OR_DEPARTMENT_OTHER): Payer: BLUE CROSS/BLUE SHIELD

## 2016-09-15 ENCOUNTER — Telehealth: Payer: Self-pay | Admitting: *Deleted

## 2016-09-15 ENCOUNTER — Ambulatory Visit (HOSPITAL_BASED_OUTPATIENT_CLINIC_OR_DEPARTMENT_OTHER): Payer: BLUE CROSS/BLUE SHIELD | Admitting: Oncology

## 2016-09-15 ENCOUNTER — Telehealth: Payer: Self-pay | Admitting: Oncology

## 2016-09-15 VITALS — BP 111/73 | HR 79 | Temp 98.3°F | Resp 18 | Ht 66.0 in | Wt 270.9 lb

## 2016-09-15 DIAGNOSIS — D6851 Activated protein C resistance: Secondary | ICD-10-CM

## 2016-09-15 DIAGNOSIS — D500 Iron deficiency anemia secondary to blood loss (chronic): Secondary | ICD-10-CM

## 2016-09-15 DIAGNOSIS — Z7901 Long term (current) use of anticoagulants: Secondary | ICD-10-CM

## 2016-09-15 DIAGNOSIS — Z23 Encounter for immunization: Secondary | ICD-10-CM

## 2016-09-15 DIAGNOSIS — N920 Excessive and frequent menstruation with regular cycle: Secondary | ICD-10-CM

## 2016-09-15 DIAGNOSIS — D509 Iron deficiency anemia, unspecified: Secondary | ICD-10-CM

## 2016-09-15 DIAGNOSIS — Z86711 Personal history of pulmonary embolism: Secondary | ICD-10-CM

## 2016-09-15 LAB — IRON AND TIBC
%SAT: 19 % — ABNORMAL LOW (ref 21–57)
Iron: 55 ug/dL (ref 41–142)
TIBC: 280 ug/dL (ref 236–444)
UIBC: 226 ug/dL (ref 120–384)

## 2016-09-15 LAB — CBC WITH DIFFERENTIAL/PLATELET
BASO%: 1 % (ref 0.0–2.0)
Basophils Absolute: 0.1 10*3/uL (ref 0.0–0.1)
EOS ABS: 0.1 10*3/uL (ref 0.0–0.5)
EOS%: 0.8 % (ref 0.0–7.0)
HCT: 40.5 % (ref 34.8–46.6)
HGB: 13.7 g/dL (ref 11.6–15.9)
LYMPH%: 18.8 % (ref 14.0–49.7)
MCH: 29.8 pg (ref 25.1–34.0)
MCHC: 33.9 g/dL (ref 31.5–36.0)
MCV: 87.9 fL (ref 79.5–101.0)
MONO#: 0.5 10*3/uL (ref 0.1–0.9)
MONO%: 6.2 % (ref 0.0–14.0)
NEUT#: 6.4 10*3/uL (ref 1.5–6.5)
NEUT%: 73.2 % (ref 38.4–76.8)
Platelets: 268 10*3/uL (ref 145–400)
RBC: 4.61 10*6/uL (ref 3.70–5.45)
RDW: 12.7 % (ref 11.2–14.5)
WBC: 8.8 10*3/uL (ref 3.9–10.3)
lymph#: 1.6 10*3/uL (ref 0.9–3.3)

## 2016-09-15 LAB — FERRITIN: FERRITIN: 206 ng/mL (ref 9–269)

## 2016-09-15 NOTE — Telephone Encounter (Signed)
Per 2/9 LOS and staff message I have scheduled appts. Notified the scheduler 

## 2016-09-15 NOTE — Telephone Encounter (Signed)
-----   Message from Wyatt Portela, MD sent at 09/15/2016  4:19 PM EST ----- Please let her know her iron is little low but will get the infusion next week.

## 2016-09-15 NOTE — Progress Notes (Signed)
Hematology and Oncology Follow Up Visit  Robin Key [id number] Oct 13, 1985 31 y.o. 09/15/2016 2:49 PM Robin Key, [name], [name], NP   Principle Diagnosis: 31 year old woman with:  1. History of pulmonary embolism in the setting of a heterozygous factor V Leiden mutation. This was diagnosed in 2012. She had previous deep vein thrombosis that was treated with Xarelto.  2. 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  Prior Therapy:  Full dose anticoagulation with Lovenox after she had a C-section on 07/01/2013.  She is status post IV iron in the form of Feraheme on multiple occasions most recently and November 2017.  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  Interim History:  Robin Key presents today for a followup visit. Since the last visit, she reports no recent complaints. She did have a transient episode of facial numbness which has resolved at this time. She did receive intravenous iron in November of 2017 which improved her symptoms. She reported her energy was better and was not losing any of her hair.  she recently started developing slight fatigue and tiredness but no dyspnea on exertion. She is reporting some hair changes as well. She is receiving Depo-Provera which have decreased her menstrual cycle frequency but she does have periodic heavy menses. She denied any recent thrombosis or bleeding episodes.   She does not report any headaches, blurry vision or syncope. She does not report any fevers, chills, sweats or weight loss. She does not report any chest pain or dyspnea on exertion. She does not report any cough, wheezing or hemoptysis. She does not report any nausea or vomiting or abdominal pain. Did not report any neurological symptoms. Remainder of her review of systems unremarkable.   Medications: I have reviewed the patient's current medications.  Current Outpatient Prescriptions   Medication Sig Dispense Refill  . butalbital-acetaminophen-caffeine (FIORICET, ESGIC) 50-325-40 MG per tablet Take 2 tablets by mouth every 6 (six) hours as needed for headache. 30 tablet 1  . cetirizine (ZYRTEC) 10 MG tablet Take 10 mg by mouth daily.    . fluticasone (FLONASE) 50 MCG/ACT nasal spray Place 2 sprays into both nostrils daily.    . ondansetron (ZOFRAN-ODT) 8 MG disintegrating tablet Take 1 tablet by mouth 3 (three) times daily as needed.  0  . promethazine (PHENERGAN) 25 MG tablet Take 25 mg by mouth every 6 (six) hours as needed. for nausea  0  . rivaroxaban (XARELTO) 20 MG TABS tablet Take 1 tablet (20 mg total) by mouth daily. 30 tablet 3   No current facility-administered medications for this visit.      Allergies:  Allergies  Allergen Reactions  . Ciprofloxacin Other (See Comments)    Felt like "bugs crawling" when on Lovenox  . Shellfish-Derived Products Swelling    Shrimp  . Bactrim [Sulfamethoxazole-Trimethoprim] Other (See Comments)    Headache   . Percocet [Oxycodone-Acetaminophen] Nausea And Vomiting  . Latex Rash  . Penicillins Rash    Past Medical History, Surgical history, Social history, and Family History were reviewed and updated.  Physical Exam: Blood pressure 111/73, pulse 79, temperature 98.3 F (36.8 C), temperature source Oral, resp. rate 18, height 5\' 6"  (1.676 m), weight 270 lb 14.4 oz (122.9 kg), SpO2 100 %, unknown if currently breastfeeding. ECOG: 0 General appearance: Alert, awake woman without distress.  Head: Normocephalic, without obvious abnormality no oral ulcers or lesions.  Neck: no adenopathy Lymph nodes: Cervical, supraclavicular, and axillary nodes normal.  Heart:regular rate and rhythm, S1, S2 normal, no murmur, click, rub or gallop Lung:chest clear, no wheezing, rales, normal symmetric air entry Abdomen: soft, non-tender, without masses or organomegaly no rebound or guarding.  EXT:no erythema, induration, or nodules.  A  logical exam: No deficits.   Lab Results: Lab Results  Component Value Date   WBC 8.8 09/15/2016   HGB 13.7 09/15/2016   HCT 40.5 09/15/2016   MCV 87.9 09/15/2016   PLT 268 09/15/2016     Chemistry      Component Value Date/Time   NA 139 08/27/2015 1411   K 3.9 08/27/2015 1411   CL 99 07/01/2013 1115   CO2 28 08/27/2015 1411   BUN 11.5 08/27/2015 1411   CREATININE 0.9 08/27/2015 1411      Component Value Date/Time   CALCIUM 9.7 08/27/2015 1411   ALKPHOS 100 08/27/2015 1411   AST 17 08/27/2015 1411   ALT 18 08/27/2015 1411   BILITOT 0.79 08/27/2015 1411     Iron/TIBC/Ferritin/ %Sat    Component Value Date/Time   IRON 40 (L) 06/16/2016 1433   TIBC 254 06/16/2016 1433   FERRITIN 162 06/16/2016 1433   IRONPCTSAT 16 (L) 06/16/2016 1433    Impression and Plan:  31 year old woman with:  1. History of pulmonary embolism in the setting of a factor V Leiden heterozygous mutation. She is on long term anticoagulation with Xarelto and have tolerated it well.  Risks and benefits of continuing this agent was reviewed and she is agreeable to continue. Discussed the option of switching to Lovenox if she desires pregnancy.   2.Anemia:  Related to iron deficiency but her hemoglobin is normal which she is symptomatic. Iron studies are currently pending and we'll replace with intravenous iron as needed.  If her iron studies indicate worsening iron deficiency, we will repeat IV iron   3. Irregular menstrual cycles: She continues to follow with OB/GYN regarding this issue.  4. Follow-up: Will be in 3 months to recheck her iron studies.   [facility], MD 09/15/16

## 2016-09-15 NOTE — Telephone Encounter (Signed)
As noted below by Dr. Alen Blew, I tried calling patient to give her iron results. However, her cell phone is not set up to receive voice mails. If she calls, she needs to know her iron level and she will be receiving iron infusions next week.

## 2016-09-15 NOTE — Telephone Encounter (Signed)
Appointments scheduled per 2/9 LOS. Patient given AVS report and calendars with future scheduled appointments.  °

## 2016-09-20 ENCOUNTER — Other Ambulatory Visit: Payer: BLUE CROSS/BLUE SHIELD

## 2016-09-20 ENCOUNTER — Ambulatory Visit: Payer: BLUE CROSS/BLUE SHIELD | Admitting: Oncology

## 2016-09-22 ENCOUNTER — Ambulatory Visit (HOSPITAL_BASED_OUTPATIENT_CLINIC_OR_DEPARTMENT_OTHER): Payer: BLUE CROSS/BLUE SHIELD

## 2016-09-22 VITALS — BP 105/67 | HR 77 | Temp 98.5°F | Resp 18

## 2016-09-22 DIAGNOSIS — D500 Iron deficiency anemia secondary to blood loss (chronic): Secondary | ICD-10-CM

## 2016-09-22 DIAGNOSIS — D509 Iron deficiency anemia, unspecified: Secondary | ICD-10-CM | POA: Diagnosis not present

## 2016-09-22 MED ORDER — SODIUM CHLORIDE 0.9 % IV SOLN
Freq: Once | INTRAVENOUS | Status: AC
  Administered 2016-09-22: 15:00:00 via INTRAVENOUS

## 2016-09-22 MED ORDER — FERUMOXYTOL INJECTION 510 MG/17 ML
510.0000 mg | Freq: Once | INTRAVENOUS | Status: AC
  Administered 2016-09-22: 510 mg via INTRAVENOUS
  Filled 2016-09-22: qty 17

## 2016-09-22 NOTE — Progress Notes (Signed)
Patient took 25mg  po Benadryl 1 hour ago as pre-meds.   Wylene Simmer, BSN, RN 09/22/2016 3:02 PM

## 2016-10-04 ENCOUNTER — Ambulatory Visit (INDEPENDENT_AMBULATORY_CARE_PROVIDER_SITE_OTHER): Payer: BLUE CROSS/BLUE SHIELD | Admitting: Sports Medicine

## 2016-10-04 ENCOUNTER — Encounter: Payer: Self-pay | Admitting: Sports Medicine

## 2016-10-04 ENCOUNTER — Ambulatory Visit (INDEPENDENT_AMBULATORY_CARE_PROVIDER_SITE_OTHER): Payer: BLUE CROSS/BLUE SHIELD

## 2016-10-04 DIAGNOSIS — M722 Plantar fascial fibromatosis: Secondary | ICD-10-CM

## 2016-10-04 DIAGNOSIS — M79672 Pain in left foot: Secondary | ICD-10-CM

## 2016-10-04 MED ORDER — METHYLPREDNISOLONE 4 MG PO TBPK: ORAL_TABLET | ORAL | 0 refills | Status: DC

## 2016-10-04 NOTE — Patient Instructions (Signed)

## 2016-10-04 NOTE — Progress Notes (Addendum)
Subjective: Robin Key is a 31 y.o. female patient presents to office with complaint of heel pain on the left>right atient admits to post static dyskinesia for several months in duration. Patient has treated this problem with massage, KT tape, icing, and Tylenol with no relief. Denies any other pedal complaints.   Patient Active Problem List   Diagnosis Date Noted  . Cholecystitis, chronic 10/18/2015  . Cholecystitis 10/15/2015  . Leg pain 10/15/2014  . Factor V Leiden, prothrombin gene mutation ([place]) 10/15/2014  . Varicose veins of lower extremities with other complications [id number]  . Pain and swelling of lower extremity 10/08/2013  . Anemia 09/08/2013  . Personal history of venous thrombosis and embolism 09/08/2013  . Cesarean delivery delivered 07/01/2013    Current Outpatient Prescriptions on File Prior to Visit  Medication Sig Dispense Refill  . butalbital-acetaminophen-caffeine (FIORICET, ESGIC) 50-325-40 MG per tablet Take 2 tablets by mouth every 6 (six) hours as needed for headache. 30 tablet 1  . cetirizine (ZYRTEC) 10 MG tablet Take 10 mg by mouth daily.    . fluticasone (FLONASE) 50 MCG/ACT nasal spray Place 2 sprays into both nostrils daily.    . ondansetron (ZOFRAN-ODT) 8 MG disintegrating tablet Take 1 tablet by mouth 3 (three) times daily as needed.  0  . promethazine (PHENERGAN) 25 MG tablet Take 25 mg by mouth every 6 (six) hours as needed. for nausea  0  . rivaroxaban (XARELTO) 20 MG TABS tablet Take 1 tablet (20 mg total) by mouth daily. 30 tablet 3   No current facility-administered medications on file prior to visit.     Allergies  Allergen Reactions  . Ciprofloxacin Other (See Comments)    Felt like "bugs crawling" when on Lovenox  . Shellfish-Derived Products Swelling    Shrimp Shrimp  . Bactrim [Sulfamethoxazole-Trimethoprim] Other (See Comments)    headache Headache   . Oxycodone Nausea And Vomiting  . Percocet [Oxycodone-Acetaminophen]  Nausea And Vomiting  . Amoxicillin Rash  . Latex Rash  . Oxycodone-Acetaminophen Nausea And Vomiting  . Penicillins Rash  . Sulfa Antibiotics Rash    Objective: Physical Exam General: The patient is alert and oriented x3 in no acute distress.  Dermatology: Skin is warm, dry and supple bilateral lower extremities. Nails 1-10 are normal. There is no erythema, edema, no eccymosis, no open lesions present. Integument is otherwise unremarkable.  Vascular: Dorsalis Pedis pulse and Posterior Tibial pulse are 2/4 bilateral. Capillary fill time is immediate to all digits.  Neurological: Grossly intact to light touch with an achilles reflex of +2/5 and a negative Tinel's sign bilateral.  Musculoskeletal: Tenderness to palpation at the medial calcaneal tubercale and through the insertion of the plantar fascia on the left>right foot. No pain with compression of calcaneus bilateral. No pain with tuning fork to calcaneus bilateral. No pain with calf compression bilateral. There is decreased Ankle joint range of motion bilateral. All other joints range of motion within normal limits bilateral. Strength 5/5 in all groups bilateral.   Xray, Left foot:  Normal osseous mineralization. Joint spaces preserved. No fracture/dislocation/boney destruction. Calcaneal spur present with mild thickening of plantar fascia. No other soft tissue abnormalities or radiopaque foreign bodies.   Assessment and Plan: Problem List Items Addressed This Visit    None    Visit Diagnoses    Left foot pain    -  Primary   Relevant Medications   methylPREDNISolone (MEDROL DOSEPAK) 4 MG TBPK tablet   Other Relevant Orders   DG Foot  2 Views Left   Plantar fasciitis of left foot       Relevant Medications   methylPREDNISolone (MEDROL DOSEPAK) 4 MG TBPK tablet     -Complete examination performed.  -Xrays reviewed -Discussed with patient in detail the condition of plantar fasciitis, how this occurs and general treatment  options. Explained both conservative and surgical treatments.  -Will check with Dr. Alen Blew about recommendations on holding Xarelto prior to next visit in case patient will need steriod injection in heels for pain and inflammation -Rx Medrol dose pack  -Recommended good supportive shoes and advised use of OTC insert. Explained to patient that if these orthoses work well, we will continue with these. If these do not improve her condition and  pain, we will consider custom molded orthoses. - Explained in detail the use of the fascial braces were dispensed at today's visit. -Explained and dispensed to patient daily stretching exercises. -Recommend patient to ice affected area 1-2x daily. -Patient to return to office in 3-4 weeks for follow up or sooner if problems or questions arise.  Landis Martins, DPM

## 2016-10-11 ENCOUNTER — Telehealth: Payer: Self-pay | Admitting: *Deleted

## 2016-10-11 DIAGNOSIS — M722 Plantar fascial fibromatosis: Secondary | ICD-10-CM

## 2016-10-11 DIAGNOSIS — M79672 Pain in left foot: Secondary | ICD-10-CM

## 2016-10-11 NOTE — Telephone Encounter (Addendum)
Pt states while on the steroid pack her foot was better, now completed the foot hurts again. I spoke with pt and encouraged her to ice foot 3-4 times a day for 10-20 minutes each session with foot protected from the ice with fabric, and I would call with Dr. Leeanne Rio instructions tomorrow. 10/12/2016-I informed pt of Dr. Leeanne Rio recommendations and pt states she take the medrol dose pack and come in 10/25/2016 and if necessary get the shot. Orders to [place]. 12/14/2016-Received notice from [facility] needed for Generic Diclofenac 1% Gel. Offered pt the [place] antiinflammatory cream. Pt states she is going to [facility] Sunday and may pay the cash cost for the diclofenac gel, but to go ahead and order the [employer]. I called [employer] and was told [employer] of [place] did their own prior authorizations for medications and transferred me to [place] [phone]. [employer] of [place] is closed.12/15/2016-Received faxed [employer] notice that PA was Pending. Dr. Marcene Duos the substitute [facility] Antiinflammatory cream. Faxed orders for [facility] Antiinflammatory Cream.12/22/2016-[employer]BS faxed denial for Voltaren gel. Pt was previously prescribed [facility] Antiinflammatory Cream.

## 2016-10-11 NOTE — Telephone Encounter (Signed)
We can offer her 1 refill on the medrol dose pak or she can come in sooner for steroid injection. Meanwhile continue with stretching, icing, good supportive shoes, OTC inserts, and refrain from barefoot or high impact exercise -Dr. Cannon Kettle

## 2016-10-12 ENCOUNTER — Other Ambulatory Visit: Payer: Self-pay | Admitting: Oncology

## 2016-10-12 DIAGNOSIS — D509 Iron deficiency anemia, unspecified: Secondary | ICD-10-CM

## 2016-10-12 DIAGNOSIS — Z23 Encounter for immunization: Secondary | ICD-10-CM

## 2016-10-12 DIAGNOSIS — D6851 Activated protein C resistance: Secondary | ICD-10-CM

## 2016-10-12 MED ORDER — METHYLPREDNISOLONE 4 MG PO TBPK: ORAL_TABLET | ORAL | 0 refills | Status: DC

## 2016-10-18 ENCOUNTER — Encounter: Payer: Self-pay | Admitting: Oncology

## 2016-10-25 ENCOUNTER — Encounter: Payer: Self-pay | Admitting: Sports Medicine

## 2016-10-25 ENCOUNTER — Ambulatory Visit (INDEPENDENT_AMBULATORY_CARE_PROVIDER_SITE_OTHER): Payer: BLUE CROSS/BLUE SHIELD | Admitting: Sports Medicine

## 2016-10-25 DIAGNOSIS — M722 Plantar fascial fibromatosis: Secondary | ICD-10-CM

## 2016-10-25 DIAGNOSIS — M79672 Pain in left foot: Secondary | ICD-10-CM

## 2016-10-25 DIAGNOSIS — M79671 Pain in right foot: Secondary | ICD-10-CM

## 2016-10-25 MED ORDER — TRIAMCINOLONE ACETONIDE 40 MG/ML IJ SUSP: 20.0000 mg | Freq: Once | INTRAMUSCULAR | Status: DC

## 2016-10-25 NOTE — Progress Notes (Signed)
Subjective: Robin Key is a 31 y.o. female patient who returns to office with complaint of heel pain on the left>right; states that pain is present especially after a long day of work and being on her feet. Pain is also present now at first few steps out of bed in the morning the worse on the left heel compared to the right. Patient states that desires to try injection has completed to steroid dose packs orally with very minimal improvement in symptoms. Denies any other pedal complaints.   Patient Active Problem List   Diagnosis Date Noted  . Cholecystitis, chronic 10/18/2015  . Cholecystitis 10/15/2015  . Leg pain 10/15/2014  . Factor V Leiden, prothrombin gene mutation ([place]) 10/15/2014  . Varicose veins of lower extremities with other complications 66/01/3015  . Pain and swelling of lower extremity 10/08/2013  . Anemia 09/08/2013  . Personal history of venous thrombosis and embolism 09/08/2013  . Cesarean delivery delivered 07/01/2013    Current Outpatient Prescriptions on File Prior to Visit  Medication Sig Dispense Refill  . butalbital-acetaminophen-caffeine (FIORICET, ESGIC) 50-325-40 MG per tablet Take 2 tablets by mouth every 6 (six) hours as needed for headache. 30 tablet 1  . cetirizine (ZYRTEC) 10 MG tablet Take 10 mg by mouth daily.    . fluticasone (FLONASE) 50 MCG/ACT nasal spray Place 2 sprays into both nostrils daily.    . medroxyPROGESTERone (PROVERA) 10 MG tablet TK 1 T PO  D  0  . methylPREDNISolone (MEDROL DOSEPAK) 4 MG TBPK tablet Take as instructed 21 tablet 0  . nitrofurantoin, macrocrystal-monohydrate, (MACROBID) 100 MG capsule   0  . ondansetron (ZOFRAN-ODT) 8 MG disintegrating tablet Take 1 tablet by mouth 3 (three) times daily as needed.  0  . promethazine (PHENERGAN) 25 MG tablet Take 25 mg by mouth every 6 (six) hours as needed. for nausea  0  . thyroid (ARMOUR THYROID) 60 MG tablet Take 60 mg by mouth.    Alveda Reasons 20 MG TABS tablet TAKE ONE TABLET BY  MOUTH ONCE DAILY 30 tablet 3   No current facility-administered medications on file prior to visit.     Allergies  Allergen Reactions  . Ciprofloxacin Other (See Comments)    Felt like "bugs crawling" when on Lovenox  . Shellfish-Derived Products Swelling    Shrimp Shrimp  . Bactrim [Sulfamethoxazole-Trimethoprim] Other (See Comments)    headache Headache   . Oxycodone Nausea And Vomiting  . Percocet [Oxycodone-Acetaminophen] Nausea And Vomiting  . Amoxicillin Rash  . Latex Rash  . Oxycodone-Acetaminophen Nausea And Vomiting  . Penicillins Rash  . Sulfa Antibiotics Rash    Objective: Physical Exam General: The patient is alert and oriented x3 in no acute distress.  Dermatology: Skin is warm, dry and supple bilateral lower extremities. Nails 1-10 are normal. There is no erythema, edema, no eccymosis, no open lesions present. Integument is otherwise unremarkable.  Vascular: Dorsalis Pedis pulse and Posterior Tibial pulse are 2/4 bilateral. Capillary fill time is immediate to all digits.  Neurological: Grossly intact to light touch with an achilles reflex of +2/5 and a negative Tinel's sign bilateral.  Musculoskeletal: Tenderness to palpation at the Central greater than medial calcaneal tubercale and through the insertion of the plantar fascia on the left>right foot. No pain with compression of calcaneus bilateral. No pain with tuning fork to calcaneus bilateral. No pain with calf compression bilateral. There is decreased Ankle joint range of motion bilateral. All other joints range of motion within normal limits bilateral.  Strength 5/5 in all groups bilateral.   Assessment and Plan: Problem List Items Addressed This Visit    None    Visit Diagnoses    Plantar fasciitis, bilateral    -  Primary   L>R   Relevant Medications   triamcinolone acetonide (KENALOG-40) injection 20 mg   Left foot pain       Right foot pain         -Complete examination performed.  -Previous  Xrays reviewed -Discussed with patient in detail the condition of plantar fasciitis, how this occurs and general treatment options. Explained both conservative and surgical treatments.  -I did not get a reply from Dr. Alen Blew regarding injections . Thus, I advised patient that is necessary to ice after injections or to monitor closely for hematoma. Patient expressed understanding and decided to consent to injection. -After oral consent and aseptic prep, injected a mixture containing 1 ml of 2% plain lidocaine, 1 ml 0.5% plain marcaine, 0.5 ml of kenalog 40 and 0.5 ml of dexamethasone phosphate into right and left heels without complication. Post-injection care discussed with patient.  -Recommended good supportive shoes and advised use of OTC insert. Patient is awaiting a gel insert. She ordered through her doctor. -Dispensed heel lift to use daily - Continue with plantar fascial braces daily -Continue with daily stretching exercises. -Recommend patient to ice affected area 1-2x daily. -Patient to return to office in 3-4 weeks for follow up or sooner if problems or questions arise. Advised patient that of chronic swelling, left ankle continues to be issue. We will proceed with leg measurements to consider Patient for lymphedema pumps since patient has a history of chronic venous insufficiency with history of DVT has failed Lasix therapy and has failed use of compression garments.  Landis Martins, DPM

## 2016-11-17 ENCOUNTER — Encounter: Payer: Self-pay | Admitting: Sports Medicine

## 2016-11-17 ENCOUNTER — Ambulatory Visit (INDEPENDENT_AMBULATORY_CARE_PROVIDER_SITE_OTHER): Payer: BLUE CROSS/BLUE SHIELD | Admitting: Sports Medicine

## 2016-11-17 DIAGNOSIS — M722 Plantar fascial fibromatosis: Secondary | ICD-10-CM

## 2016-11-17 DIAGNOSIS — M7672 Peroneal tendinitis, left leg: Secondary | ICD-10-CM | POA: Diagnosis not present

## 2016-11-17 DIAGNOSIS — M79672 Pain in left foot: Secondary | ICD-10-CM

## 2016-11-17 DIAGNOSIS — M79671 Pain in right foot: Secondary | ICD-10-CM

## 2016-11-17 MED ORDER — METHYLPREDNISOLONE 4 MG PO TBPK: ORAL_TABLET | ORAL | 0 refills | Status: DC

## 2016-11-17 MED ORDER — TRIAMCINOLONE ACETONIDE 10 MG/ML IJ SUSP: 10.0000 mg | Freq: Once | INTRAMUSCULAR | Status: DC

## 2016-11-17 NOTE — Progress Notes (Signed)
Subjective: Robin Key is a 31 y.o. female patient who returns to office with complaint of heel pain on the left>right; states that pain is still present on left, 5 out of 10 and on right, pain is better 0 out of 10. Patient states that she is really trying to get better before her [place] trip next month and wanted to come in a little sooner for a possible reinjection if needed. Denies any other pedal complaints.   Patient Active Problem List   Diagnosis Date Noted  . Cholecystitis, chronic 10/18/2015  . Cholecystitis 10/15/2015  . Leg pain 10/15/2014  . Factor V Leiden, prothrombin gene mutation ([place]) 10/15/2014  . Varicose veins of lower extremities with other complications 04/54/0981  . Pain and swelling of lower extremity 10/08/2013  . Anemia 09/08/2013  . Personal history of venous thrombosis and embolism 09/08/2013  . Cesarean delivery delivered 07/01/2013    Current Outpatient Prescriptions on File Prior to Visit  Medication Sig Dispense Refill  . butalbital-acetaminophen-caffeine (FIORICET, ESGIC) 50-325-40 MG per tablet Take 2 tablets by mouth every 6 (six) hours as needed for headache. 30 tablet 1  . cetirizine (ZYRTEC) 10 MG tablet Take 10 mg by mouth daily.    . fluticasone (FLONASE) 50 MCG/ACT nasal spray Place 2 sprays into both nostrils daily.    . medroxyPROGESTERone (PROVERA) 10 MG tablet TK 1 T PO  D  0  . nitrofurantoin, macrocrystal-monohydrate, (MACROBID) 100 MG capsule   0  . ondansetron (ZOFRAN-ODT) 8 MG disintegrating tablet Take 1 tablet by mouth 3 (three) times daily as needed.  0  . promethazine (PHENERGAN) 25 MG tablet Take 25 mg by mouth every 6 (six) hours as needed. for nausea  0  . thyroid (ARMOUR THYROID) 60 MG tablet Take 60 mg by mouth.    Alveda Reasons 20 MG TABS tablet TAKE ONE TABLET BY MOUTH ONCE DAILY 30 tablet 3   Current Facility-Administered Medications on File Prior to Visit  Medication Dose Route Frequency Provider Last Rate Last Dose  .  triamcinolone acetonide (KENALOG-40) injection 20 mg  20 mg Other Once Landis Martins, DPM        Allergies  Allergen Reactions  . Ciprofloxacin Other (See Comments)    Felt like "bugs crawling" when on Lovenox  . Shellfish-Derived Products Swelling    Shrimp Shrimp  . Bactrim [Sulfamethoxazole-Trimethoprim] Other (See Comments)    headache Headache   . Oxycodone Nausea And Vomiting  . Percocet [Oxycodone-Acetaminophen] Nausea And Vomiting  . Amoxicillin Rash  . Latex Rash  . Oxycodone-Acetaminophen Nausea And Vomiting  . Penicillins Rash  . Sulfa Antibiotics Rash    Objective: Physical Exam General: The patient is alert and oriented x3 in no acute distress.  Dermatology: Skin is warm, dry and supple bilateral lower extremities. Nails 1-10 are normal. There is no erythema, edema, no eccymosis, no open lesions present. Integument is otherwise unremarkable.  Vascular: Dorsalis Pedis pulse and Posterior Tibial pulse are 2/4 bilateral. Capillary fill time is immediate to all digits.  Neurological: Grossly intact to light touch with an achilles reflex of +2/5 and a negative Tinel's sign bilateral.  Musculoskeletal: Tenderness to palpation at the Central greater than medial calcaneal tubercale and through the insertion of the plantar fascia on the left, no pain on the right foot. There is also pain along the peroneal tendon course at the level of the ankle on the left. No pain with compression of calcaneus bilateral. No pain with tuning fork to calcaneus  bilateral. No pain with calf compression bilateral. There is decreased Ankle joint range of motion bilateral. All other joints range of motion within normal limits bilateral. Strength 5/5 in all groups bilateral.   Assessment and Plan: Problem List Items Addressed This Visit    None    Visit Diagnoses    Plantar fasciitis, left    -  Primary   Relevant Medications   methylPREDNISolone (MEDROL DOSEPAK) 4 MG TBPK tablet    triamcinolone acetonide (KENALOG) 10 MG/ML injection 10 mg   Peroneal tendonitis of left lower leg       Relevant Medications   triamcinolone acetonide (KENALOG) 10 MG/ML injection 10 mg   Left foot pain       Relevant Medications   methylPREDNISolone (MEDROL DOSEPAK) 4 MG TBPK tablet   triamcinolone acetonide (KENALOG) 10 MG/ML injection 10 mg   Right foot pain       Much improved   Relevant Medications   methylPREDNISolone (MEDROL DOSEPAK) 4 MG TBPK tablet     -Complete examination performed.  -Previous Xrays reviewed -Discussed with patient in detail the condition of plantar fasciitis with now tendinitis, how this occurs and general treatment options. Explained both conservative and surgical treatments.  -After oral consent and aseptic prep, injected a mixture containing 1 ml of 2% plain lidocaine, 1 ml 0.5% plain marcaine, 0.5 ml of kenalog 10 and 0.5 ml of dexamethasone phosphate into left heel at central aspect of the plantar fascia and at left lateral ankle along the peroneal tendon course without complication. Post-injection care discussed with patient.  -Recommended continue with good supportive shoes  -Continue with gel heel insoles that she ordered from another doctor - Continue with plantar fascial braces daily, However patient reports that she hasn't been using them. -Continue with daily stretching exercises. -Recommend patient to ice affected area 1-2x daily. -Patient to return to office in 3 weeks for follow up or sooner if problems or questions arise. Advised patient that of chronic swelling, left ankle continues to be issue. We will proceed with leg measurements to consider Patient for lymphedema pumps since patient has a history of chronic venous insufficiency with history of DVT has failed Lasix therapy and has failed use of compression garments. We will focus on doing this after her heels are better and once she is back from her trip from [place].  Landis Martins,  DPM

## 2016-11-24 ENCOUNTER — Ambulatory Visit: Payer: BLUE CROSS/BLUE SHIELD | Admitting: Sports Medicine

## 2016-11-29 ENCOUNTER — Telehealth: Payer: Self-pay | Admitting: *Deleted

## 2016-11-29 NOTE — Telephone Encounter (Signed)
"  Yes I can come in this day and time.  I'll see you Friday."  Scheduling notified.

## 2016-11-29 NOTE — Telephone Encounter (Signed)
Please let her know that we can see her on 4/27 2:30 for lab and 3:00 pm MD if she can make it.

## 2016-11-29 NOTE — Telephone Encounter (Signed)
"  I do not know when my next appointment is but I need lab and to be seen sooner.  I've had a period twice in two and a half weeks.  I have headaches and dizziness so I may need an  iron infusion.  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  CALL ME at [phone] to [place].  I have to be off work.  I saw my GYN last week.  I will be seeing a specialist next week because GYN cannot give me hormones to stop the bleeding.  Hormones will make my blood clot."  Will notify provider for this request to move up the scheduled appointment on Dec 12, 2016 for lab and MD for this patient.

## 2016-12-01 ENCOUNTER — Ambulatory Visit (HOSPITAL_BASED_OUTPATIENT_CLINIC_OR_DEPARTMENT_OTHER): Payer: BLUE CROSS/BLUE SHIELD | Admitting: Oncology

## 2016-12-01 ENCOUNTER — Other Ambulatory Visit (HOSPITAL_BASED_OUTPATIENT_CLINIC_OR_DEPARTMENT_OTHER): Payer: BLUE CROSS/BLUE SHIELD

## 2016-12-01 VITALS — BP 114/73 | HR 71 | Temp 98.6°F | Resp 20 | Ht 66.0 in | Wt 273.5 lb

## 2016-12-01 DIAGNOSIS — N920 Excessive and frequent menstruation with regular cycle: Secondary | ICD-10-CM

## 2016-12-01 DIAGNOSIS — D500 Iron deficiency anemia secondary to blood loss (chronic): Secondary | ICD-10-CM

## 2016-12-01 DIAGNOSIS — Z7901 Long term (current) use of anticoagulants: Secondary | ICD-10-CM

## 2016-12-01 DIAGNOSIS — Z86711 Personal history of pulmonary embolism: Secondary | ICD-10-CM | POA: Diagnosis not present

## 2016-12-01 DIAGNOSIS — D6851 Activated protein C resistance: Secondary | ICD-10-CM

## 2016-12-01 LAB — CBC WITH DIFFERENTIAL/PLATELET
BASO%: 0.2 % (ref 0.0–2.0)
Basophils Absolute: 0 10*3/uL (ref 0.0–0.1)
EOS%: 1 % (ref 0.0–7.0)
Eosinophils Absolute: 0.1 10*3/uL (ref 0.0–0.5)
HCT: 40.6 % (ref 34.8–46.6)
HGB: 13.5 g/dL (ref 11.6–15.9)
LYMPH%: 20.9 % (ref 14.0–49.7)
MCH: 30.1 pg (ref 25.1–34.0)
MCHC: 33.3 g/dL (ref 31.5–36.0)
MCV: 90.4 fL (ref 79.5–101.0)
MONO#: 0.7 10*3/uL (ref 0.1–0.9)
MONO%: 6.6 % (ref 0.0–14.0)
NEUT#: 7.4 10*3/uL — ABNORMAL HIGH (ref 1.5–6.5)
NEUT%: 71.3 % (ref 38.4–76.8)
Platelets: 262 10*3/uL (ref 145–400)
RBC: 4.49 10*6/uL (ref 3.70–5.45)
RDW: 13.2 % (ref 11.2–14.5)
WBC: 10.3 10*3/uL (ref 3.9–10.3)
lymph#: 2.2 10*3/uL (ref 0.9–3.3)

## 2016-12-01 NOTE — Progress Notes (Signed)
Hematology and Oncology Follow Up Visit  PINKI ROTTMAN [id number] 12-19-1985 31 y.o. 12/01/2016 3:35 PM [name], [name], [name], NP   Principle Diagnosis: 31 year old woman with:  1. History of pulmonary embolism in the setting of a heterozygous factor V Leiden mutation. This was diagnosed in 2012. She had previous deep vein thrombosis that was treated with Xarelto.  2. 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  Prior Therapy:  Full dose anticoagulation with Lovenox after she had a C-section on 07/01/2013.  She is status post IV iron in the form of Feraheme on multiple occasions.   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 IV iron intermittently for iron deficiency anemia due to menorrhagia.  Interim History:  Ms. Litaker presents today for a followup visit. Since the last visit, she reports having extra menstrual cycles this month and felt more fatigued and tired. She did receive intravenous iron on February 2018 which she tolerated reasonably well. She did improve her symptoms but she reports may be she is developing iron deficiency again. Despite these symptoms, she continues to work full time without any hindrance or decline in her ability to do so. She denied any hematochezia or melena.   She does not report any headaches, blurry vision or syncope. She does not report any fevers, chills, sweats or weight loss. She does not report any chest pain or dyspnea on exertion. She does not report any cough, wheezing or hemoptysis. She does not report any nausea or vomiting or abdominal pain. Did not report any neurological symptoms. Remainder of her review of systems unremarkable.   Medications: I have reviewed the patient's current medications.  Current Outpatient Prescriptions  Medication Sig Dispense Refill  . butalbital-acetaminophen-caffeine (FIORICET, ESGIC) 50-325-40 MG per tablet Take 2 tablets  by mouth every 6 (six) hours as needed for headache. 30 tablet 1  . cetirizine (ZYRTEC) 10 MG tablet Take 10 mg by mouth daily.    . fluticasone (FLONASE) 50 MCG/ACT nasal spray Place 2 sprays into both nostrils daily.    . medroxyPROGESTERone (PROVERA) 10 MG tablet TK 1 T PO  D  0  . methylPREDNISolone (MEDROL DOSEPAK) 4 MG TBPK tablet Take as instructed 21 tablet 0  . nitrofurantoin, macrocrystal-monohydrate, (MACROBID) 100 MG capsule   0  . ondansetron (ZOFRAN-ODT) 8 MG disintegrating tablet Take 1 tablet by mouth 3 (three) times daily as needed.  0  . promethazine (PHENERGAN) 25 MG tablet Take 25 mg by mouth every 6 (six) hours as needed. for nausea  0  . thyroid (ARMOUR THYROID) 60 MG tablet Take 60 mg by mouth.    Alveda Reasons 20 MG TABS tablet TAKE ONE TABLET BY MOUTH ONCE DAILY 30 tablet 3   Current Facility-Administered Medications  Medication Dose Route Frequency Provider Last Rate Last Dose  . triamcinolone acetonide (KENALOG) 10 MG/ML injection 10 mg  10 mg Other Once [employer], DPM      . triamcinolone acetonide (KENALOG-40) injection 20 mg  20 mg Other Once Landis Martins, DPM         Allergies:  Allergies  Allergen Reactions  . Ciprofloxacin Other (See Comments)    Felt like "bugs crawling" when on Lovenox  . Shellfish-Derived Products Swelling    Shrimp Shrimp  . Bactrim [Sulfamethoxazole-Trimethoprim] Other (See Comments)    headache Headache   . Oxycodone Nausea And Vomiting  . Percocet [Oxycodone-Acetaminophen] Nausea And Vomiting  . Amoxicillin Rash  .  Latex Rash  . Oxycodone-Acetaminophen Nausea And Vomiting  . Penicillins Rash  . Sulfa Antibiotics Rash    Past Medical History, Surgical history, Social history, and Family History were reviewed and updated.  Physical Exam: Blood pressure 114/73, pulse 71, temperature 98.6 F (37 C), temperature source Oral, resp. rate 20, height 5\' 6"  (1.676 m), weight 273 lb 8 oz (124.1 kg), SpO2 100 %, unknown if  currently breastfeeding. ECOG: 0 General appearance: Well appearing woman without distress. Head: Normocephalic, without obvious abnormality no oral thrush or ulcers. Neck: no adenopathy Lymph nodes: Cervical, supraclavicular, and axillary nodes normal. Heart:regular rate and rhythm, S1, S2 normal, no murmur, click, rub or gallop Lung:chest clear, no wheezing, rales, normal symmetric air entry Abdomen: soft, non-tender, without masses or organomegaly no shifting dullness or ascites. EXT:no erythema, induration, or nodules.  A logical exam: No deficits.   Lab Results: Lab Results  Component Value Date   WBC 10.3 12/01/2016   HGB 13.5 12/01/2016   HCT 40.6 12/01/2016   MCV 90.4 12/01/2016   PLT 262 12/01/2016     Chemistry      Component Value Date/Time   NA 139 08/27/2015 1411   K 3.9 08/27/2015 1411   CL 99 07/01/2013 1115   CO2 28 08/27/2015 1411   BUN 11.5 08/27/2015 1411   CREATININE 0.9 08/27/2015 1411      Component Value Date/Time   CALCIUM 9.7 08/27/2015 1411   ALKPHOS 100 08/27/2015 1411   AST 17 08/27/2015 1411   ALT 18 08/27/2015 1411   BILITOT 0.79 08/27/2015 1411     Iron/TIBC/Ferritin/ %Sat    Component Value Date/Time   IRON 55 09/15/2016 1421   TIBC 280 09/15/2016 1421   FERRITIN 206 09/15/2016 1421   IRONPCTSAT 19 (L) 09/15/2016 1421    Impression and Plan:  31 year old woman with:  1. History of pulmonary embolism in the setting of a factor V Leiden heterozygous mutation. She is on long term anticoagulation with Xarelto and have tolerated it well.  The plan is to continue this medication indefinitely unless she becomes pregnant and at that time Lovenox will be needed.   2.Anemia:  Related to iron deficiency but her hemoglobin is normal which she is symptomatic. Iron studies are currently pending and we'll replace with intravenous iron as needed.  She is mildly symptomatic today and we will replace IV iron as needed.  3. Irregular menstrual  cycles: She continues to follow with OB/GYN regarding this issue.  4. Follow-up: Will be in 3 months to recheck her iron studies.   [facility], MD 12/01/16

## 2016-12-04 ENCOUNTER — Telehealth: Payer: Self-pay | Admitting: Oncology

## 2016-12-04 ENCOUNTER — Telehealth: Payer: Self-pay | Admitting: *Deleted

## 2016-12-04 LAB — FERRITIN: Ferritin: 340 ng/ml — ABNORMAL HIGH (ref 9–269)

## 2016-12-04 LAB — IRON AND TIBC
%SAT: 15 % — AB (ref 21–57)
Iron: 40 ug/dL — ABNORMAL LOW (ref 41–142)
TIBC: 263 ug/dL (ref 236–444)
UIBC: 222 ug/dL (ref 120–384)

## 2016-12-04 NOTE — Telephone Encounter (Signed)
Spoke with patient re 3 month lab/fu 7/27 and 5/8 iv iron per 4/30 schedule message - message this week only (*1). Patient not able to come 5/3 - moved to 58 - ok per FS.

## 2016-12-04 NOTE — Telephone Encounter (Signed)
-----   Message from Wyatt Portela, MD sent at 12/04/2016 10:36 AM EDT ----- Please let her iron is little low. She will need one infusion of feraheme. Message sent to scheduling.

## 2016-12-04 NOTE — Addendum Note (Signed)
Addended by: Wyatt Portela on: 12/04/2016 10:39 AM   Modules accepted: Orders

## 2016-12-04 NOTE — Telephone Encounter (Signed)
As noted below by Dr. Alen Blew, I informed patient of her iron level. Also, told her scheduling should be calling to arrange an appointment for one more iron infusion. Patient verbalized understanding.

## 2016-12-07 ENCOUNTER — Ambulatory Visit: Payer: BLUE CROSS/BLUE SHIELD

## 2016-12-08 ENCOUNTER — Ambulatory Visit (INDEPENDENT_AMBULATORY_CARE_PROVIDER_SITE_OTHER): Payer: BLUE CROSS/BLUE SHIELD | Admitting: Sports Medicine

## 2016-12-08 ENCOUNTER — Encounter: Payer: Self-pay | Admitting: Sports Medicine

## 2016-12-08 DIAGNOSIS — M722 Plantar fascial fibromatosis: Secondary | ICD-10-CM

## 2016-12-08 DIAGNOSIS — M79671 Pain in right foot: Secondary | ICD-10-CM

## 2016-12-08 DIAGNOSIS — M79672 Pain in left foot: Secondary | ICD-10-CM

## 2016-12-08 DIAGNOSIS — M7672 Peroneal tendinitis, left leg: Secondary | ICD-10-CM

## 2016-12-08 MED ORDER — DICLOFENAC SODIUM 1 % TD GEL: 4.0000 g | Freq: Four times a day (QID) | TRANSDERMAL | 2 refills | Status: DC

## 2016-12-08 MED ORDER — METHYLPREDNISOLONE 4 MG PO TBPK: ORAL_TABLET | ORAL | 0 refills | Status: DC

## 2016-12-09 NOTE — Progress Notes (Signed)
Subjective: Robin Key is a 31 y.o. female patient who returns to office with complaint of heel pain on the left>right; states that pain is still present on left and is slowly returning on right. Reports that he ankle is also bothering her on the left. Patient states that she is really trying to get better before her [place] trip next week. Patient states that she is not sure what else she needs to do. Denies any other pedal complaints.   Patient Active Problem List   Diagnosis Date Noted  . Cholecystitis, chronic 10/18/2015  . Cholecystitis 10/15/2015  . Leg pain 10/15/2014  . Factor V Leiden, prothrombin gene mutation ([place]) 10/15/2014  . Varicose veins of lower extremities with other complications 71/24/5809  . Pain and swelling of lower extremity 10/08/2013  . Anemia 09/08/2013  . Personal history of venous thrombosis and embolism 09/08/2013  . Cesarean delivery delivered 07/01/2013    Current Outpatient Prescriptions on File Prior to Visit  Medication Sig Dispense Refill  . butalbital-acetaminophen-caffeine (FIORICET, ESGIC) 50-325-40 MG per tablet Take 2 tablets by mouth every 6 (six) hours as needed for headache. 30 tablet 1  . cetirizine (ZYRTEC) 10 MG tablet Take 10 mg by mouth daily.    . fluticasone (FLONASE) 50 MCG/ACT nasal spray Place 2 sprays into both nostrils daily.    . medroxyPROGESTERone (PROVERA) 10 MG tablet TK 1 T PO  D  0  . nitrofurantoin, macrocrystal-monohydrate, (MACROBID) 100 MG capsule   0  . ondansetron (ZOFRAN-ODT) 8 MG disintegrating tablet Take 1 tablet by mouth 3 (three) times daily as needed.  0  . promethazine (PHENERGAN) 25 MG tablet Take 25 mg by mouth every 6 (six) hours as needed. for nausea  0  . thyroid (ARMOUR THYROID) 60 MG tablet Take 60 mg by mouth.    Alveda Reasons 20 MG TABS tablet TAKE ONE TABLET BY MOUTH ONCE DAILY 30 tablet 3   Current Facility-Administered Medications on File Prior to Visit  Medication Dose Route Frequency Provider  Last Rate Last Dose  . triamcinolone acetonide (KENALOG) 10 MG/ML injection 10 mg  10 mg Other Once [place], Yong Wahlquist, DPM      . triamcinolone acetonide (KENALOG-40) injection 20 mg  20 mg Other Once Landis Martins, DPM        Allergies  Allergen Reactions  . Ciprofloxacin Other (See Comments)    Felt like "bugs crawling" when on Lovenox  . Shellfish-Derived Products Swelling    Shrimp Shrimp  . Bactrim [Sulfamethoxazole-Trimethoprim] Other (See Comments)    headache Headache   . Oxycodone Nausea And Vomiting  . Percocet [Oxycodone-Acetaminophen] Nausea And Vomiting  . Amoxicillin Rash  . Latex Rash  . Oxycodone-Acetaminophen Nausea And Vomiting  . Penicillins Rash  . Sulfa Antibiotics Rash    Objective: Physical Exam General: The patient is alert and oriented x3 in no acute distress.  Dermatology: Skin is warm, dry and supple bilateral lower extremities. Nails 1-10 are normal. There is no erythema, + focal edema bilateral ankle, no eccymosis, no open lesions present. Integument is otherwise unremarkable.  Vascular: Dorsalis Pedis pulse and Posterior Tibial pulse are 2/4 bilateral. Capillary fill time is immediate to all digits.  Neurological: Grossly intact to light touch with an achilles reflex of +2/5 and a negative Tinel's sign bilateral.  Musculoskeletal: Tenderness to palpation at the lateral calcaneal tubercale and through the insertion of the plantar fascia on the left and medial tubercale on right,There is also pain along the peroneal tendon course  at the level of the ankle on the left and now sinus tarsi. No pain with compression of calcaneus bilateral. No pain with tuning fork to calcaneus bilateral. No pain with calf compression bilateral. There is decreased Ankle joint range of motion bilateral. All other joints range of motion within normal limits bilateral. Strength 5/5 in all groups bilateral.   Assessment and Plan: Problem List Items Addressed This Visit     None    Visit Diagnoses    Plantar fasciitis, left    -  Primary   Relevant Medications   methylPREDNISolone (MEDROL DOSEPAK) 4 MG TBPK tablet   diclofenac sodium (VOLTAREN) 1 % GEL   Peroneal tendonitis of left lower leg       Relevant Medications   diclofenac sodium (VOLTAREN) 1 % GEL   Left foot pain       Relevant Medications   methylPREDNISolone (MEDROL DOSEPAK) 4 MG TBPK tablet   diclofenac sodium (VOLTAREN) 1 % GEL   Right foot pain       Relevant Medications   diclofenac sodium (VOLTAREN) 1 % GEL   Plantar fasciitis, right       Relevant Medications   diclofenac sodium (VOLTAREN) 1 % GEL     -Complete examination performed.  -Previous Xrays reviewed -Discussed with patient in detail the condition of plantar fasciitis with tendinitis and ankle pain, how this occurs and general treatment options. Explained both conservative and surgical treatments.  -After oral consent and aseptic prep, injected a mixture containing 1 ml of 2% plain lidocaine, 1 ml 0.5% plain marcaine, 0.5 ml of kenalog 10 and 0.5 ml of dexamethasone phosphate into left heel at central aspect of the plantar fascia and at left lateral ankle along the peroneal tendon course without complication. This is injection #3 to site. Post-injection care discussed with patient. -After oral consent and aseptic prep, injected a mixture containing 1 ml of 2% plain lidocaine, 1 ml 0.5% plain marcaine, 0.5 ml of kenalog 10 and 0.5 ml of dexamethasone phosphate into right medial heel at plantar fascia. This is injection #2 to site. Post-injection care discussed with patient. -Recommended continue with good supportive shoes  -Replaced her insoles with shoe liners that we had in office; will also check to see if we have extra plastizote insoles at our [place] office  -Continue with plantar fascial braces daily, However patient reports that she hasn't been using them. -Rx Night splint to use as instructed  -Refilled medrol  dosepak -Rx Voltaren topical since patient can not have any NSAIDs while on blood thinner and risks are too great for her to stop blood thinner right now especially with long car trip/travel planned  -Continue with daily stretching exercises. -Recommend patient to ice affected area 1-2x daily. -Patient to return to office in 3-4 weeks for follow up or sooner if problems or questions arise. Advised patient that of chronic swelling, left>right ankle continues to be issue. We will proceed with leg measurements to consider Patient for lymphedema pumps since patient has a history of chronic venous insufficiency with history of DVT has failed Lasix therapy and has failed use of compression garments. We will focus on doing this after her heels are better and once she is back from her trip from [place].  Landis Martins, DPM

## 2016-12-12 ENCOUNTER — Other Ambulatory Visit: Payer: BLUE CROSS/BLUE SHIELD

## 2016-12-12 ENCOUNTER — Ambulatory Visit (HOSPITAL_BASED_OUTPATIENT_CLINIC_OR_DEPARTMENT_OTHER): Payer: BLUE CROSS/BLUE SHIELD

## 2016-12-12 ENCOUNTER — Ambulatory Visit: Payer: BLUE CROSS/BLUE SHIELD | Admitting: Oncology

## 2016-12-12 VITALS — BP 107/60 | HR 82 | Temp 98.3°F | Resp 18

## 2016-12-12 DIAGNOSIS — D500 Iron deficiency anemia secondary to blood loss (chronic): Secondary | ICD-10-CM

## 2016-12-12 DIAGNOSIS — N920 Excessive and frequent menstruation with regular cycle: Secondary | ICD-10-CM

## 2016-12-12 MED ORDER — SODIUM CHLORIDE 0.9 % IV SOLN
Freq: Once | INTRAVENOUS | Status: AC
  Administered 2016-12-12: 16:00:00 via INTRAVENOUS

## 2016-12-12 MED ORDER — FERUMOXYTOL INJECTION 510 MG/17 ML
510.0000 mg | Freq: Once | INTRAVENOUS | Status: AC
  Administered 2016-12-12: 510 mg via INTRAVENOUS
  Filled 2016-12-12: qty 17

## 2016-12-12 NOTE — Patient Instructions (Signed)
Ferumoxytol injection (Feraheme) What is this medicine? FERUMOXYTOL is an iron complex. Iron is used to make healthy red blood cells, which carry oxygen and nutrients throughout the body. This medicine is used to treat iron deficiency anemia in people with chronic kidney disease. COMMON BRAND NAME(S): Feraheme What should I tell my health care provider before I take this medicine? They need to know if you have any of these conditions: -anemia not caused by low iron levels -high levels of iron in the blood -magnetic resonance imaging (MRI) test scheduled -an unusual or allergic reaction to iron, other medicines, foods, dyes, or preservatives -pregnant or trying to get pregnant -breast-feeding How should I use this medicine? This medicine is for injection into a vein. It is given by a health care professional in a hospital or clinic setting. Talk to your pediatrician regarding the use of this medicine in children. Special care may be needed. What if I miss a dose? It is important not to miss your dose. Call your doctor or health care professional if you are unable to keep an appointment. What may interact with this medicine? This medicine may interact with the following medications: -other iron products What should I watch for while using this medicine? Visit your doctor or healthcare professional regularly. Tell your doctor or healthcare professional if your symptoms do not start to get better or if they get worse. You may need blood work done while you are taking this medicine. You may need to follow a special diet. Talk to your doctor. Foods that contain iron include: whole grains/cereals, dried fruits, beans, or peas, leafy green vegetables, and organ meats (liver, kidney). What side effects may I notice from receiving this medicine? Side effects that you should report to your doctor or health care professional as soon as possible: -allergic reactions like skin rash, itching or hives,  swelling of the face, lips, or tongue -breathing problems -changes in blood pressure -feeling faint or lightheaded, falls -fever or chills -flushing, sweating, or hot feelings -swelling of the ankles or feet Side effects that usually do not require medical attention (report to your doctor or health care professional if they continue or are bothersome): -diarrhea -headache -nausea, vomiting -stomach pain Where should I keep my medicine? This drug is given in a hospital or clinic and will not be stored at home.  2017 Elsevier/Gold Standard (2015-08-26 12:41:49)  

## 2016-12-12 NOTE — Progress Notes (Signed)
Patient did not want to stay for 9minute post observation. Vital signs stable. Patient educated on infusion reaction symptoms to watch for. Voiced understanding. No complaints/concerns via patient.  Wylene Simmer, BSN, RN 12/12/2016 4:33 PM

## 2016-12-14 ENCOUNTER — Telehealth: Payer: Self-pay | Admitting: *Deleted

## 2016-12-14 NOTE — Telephone Encounter (Signed)
"  I'm calling about a prescription.  We have been calling about a week.  It hasn't been authorized yet.  The patient has requested we call you back about it. The plan id is [id number] and the phone number to call plan is [phone].  She's requesting you get that authorization completed.

## 2016-12-15 MED ORDER — NONFORMULARY OR COMPOUNDED ITEM: 2 refills | Status: DC

## 2016-12-15 NOTE — Addendum Note (Signed)
Addended by: Harriett Sine D on: 12/15/2016 10:17 AM   Modules accepted: Orders

## 2016-12-21 NOTE — Telephone Encounter (Signed)
Prescription was denied by insurance.  Per Marcy Siren, patient paid out of pocket for Voltaren and a prescription was faxed to [facility] for a compound cream.

## 2016-12-22 MED ORDER — NONFORMULARY OR COMPOUNDED ITEM: 2 refills | Status: DC

## 2016-12-22 NOTE — Addendum Note (Signed)
Addended by: Harriett Sine D on: 12/22/2016 11:48 AM   Modules accepted: Orders

## 2016-12-29 ENCOUNTER — Ambulatory Visit: Payer: BLUE CROSS/BLUE SHIELD | Admitting: Sports Medicine

## 2017-01-10 ENCOUNTER — Ambulatory Visit (HOSPITAL_BASED_OUTPATIENT_CLINIC_OR_DEPARTMENT_OTHER): Payer: BLUE CROSS/BLUE SHIELD | Admitting: Oncology

## 2017-01-10 ENCOUNTER — Telehealth: Payer: Self-pay | Admitting: *Deleted

## 2017-01-10 VITALS — BP 122/83 | HR 70 | Temp 98.6°F | Resp 20 | Ht 66.0 in | Wt 273.0 lb

## 2017-01-10 DIAGNOSIS — D6851 Activated protein C resistance: Secondary | ICD-10-CM

## 2017-01-10 DIAGNOSIS — Z86711 Personal history of pulmonary embolism: Secondary | ICD-10-CM

## 2017-01-10 DIAGNOSIS — R600 Localized edema: Secondary | ICD-10-CM | POA: Diagnosis not present

## 2017-01-10 DIAGNOSIS — Z8672 Personal history of thrombophlebitis: Secondary | ICD-10-CM | POA: Diagnosis not present

## 2017-01-10 DIAGNOSIS — N926 Irregular menstruation, unspecified: Secondary | ICD-10-CM

## 2017-01-10 DIAGNOSIS — Z7901 Long term (current) use of anticoagulants: Secondary | ICD-10-CM

## 2017-01-10 MED ORDER — ENOXAPARIN SODIUM 150 MG/ML ~~LOC~~ SOLN: 1.5000 mg/kg | SUBCUTANEOUS | 6 refills | Status: DC

## 2017-01-10 NOTE — Telephone Encounter (Signed)
Called patient with no answer or voice mail received.  Unable to reach patient with physician request.  Patient previously reported she is at work, not available until after 2:00 pm.

## 2017-01-10 NOTE — Telephone Encounter (Signed)
"  I need an appointment to see Dr. Alen Blew today.  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  I noticed swelling yesterday to my left foot and leg up to my knee.  Left calf hurts.  No redness or warmth.  The swelling did not go down last night so I am wearing tight pants to compress the swelling.  I also need the blood thinner changed to Lovenox.  Monday I started Femara to make me ovulate.  I'll take this for five days every month to make me ovulate is why I need blood thinner changed.  He'll need to know I weighed 271.8 lbs on Monday at the doctor's office.  Return number [phone]."        Will notify provider.  Nurse instructions provided with call to obtain compression socks to begin compression at level of toes.  Drink water, elevate legs and do not cross ankles.

## 2017-01-10 NOTE — Progress Notes (Signed)
Hematology and Oncology Follow Up Visit  VERLIE LIOTTA [id number] Sep 28, 1985 31 y.o. 01/10/2017 4:42 PM [name], [name], [name], [name], NP   Principle Diagnosis: 31 year old woman with:  1. History of pulmonary embolism in the setting of a heterozygous factor V Leiden mutation. This was diagnosed in 2012. She had previous deep vein thrombosis that was treated with Xarelto.  2. 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  Prior Therapy:  Full dose anticoagulation with Lovenox after she had a C-section on 07/01/2013.  She is status post IV iron in the form of Feraheme on multiple occasions.   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 IV iron intermittently for iron deficiency anemia due to menorrhagia.  Interim History:  Ms. Nesser presents today for a followup visit. Since the last visit, she reports have been doing reasonably well without any major changes. She did notice increase in edema in her left leg and foot after she's been off work yesterday. She does report some calf tenderness associated without edema. She also had been started on Femara and attempt to regulate her cycle as she is desiring pregnancy. She denied any complications related to Xarelto. She denied any hematochezia or melena. She continues to work full time.   She does not report any headaches, blurry vision or syncope. She does not report any fevers, chills, sweats or weight loss. She does not report any chest pain or dyspnea on exertion. She does not report any cough, wheezing or hemoptysis. She does not report any nausea or vomiting or abdominal pain. Did not report any neurological symptoms. Remainder of her review of systems unremarkable.   Medications: I have reviewed the patient's current medications.  Current Outpatient Prescriptions  Medication Sig Dispense Refill  . butalbital-acetaminophen-caffeine (FIORICET, ESGIC) 50-325-40  MG per tablet Take 2 tablets by mouth every 6 (six) hours as needed for headache. 30 tablet 1  . cetirizine (ZYRTEC) 10 MG tablet Take 10 mg by mouth daily.    . diclofenac sodium (VOLTAREN) 1 % GEL Apply 4 g topically 4 (four) times daily. 100 g 2  . enoxaparin (LOVENOX) 150 MG/ML injection Inject 1.24 mLs (185 mg total) into the skin daily. 30 Syringe 6  . fluticasone (FLONASE) 50 MCG/ACT nasal spray Place 2 sprays into both nostrils daily.    Marland Kitchen letrozole (FEMARA) 2.5 MG tablet Take 5 mg by mouth.    . medroxyPROGESTERone (PROVERA) 10 MG tablet TK 1 T PO  D  0  . medroxyPROGESTERone (PROVERA) 10 MG tablet TK 1 T PO  D    . methylPREDNISolone (MEDROL DOSEPAK) 4 MG TBPK tablet Take as instructed 21 tablet 0  . nitrofurantoin, macrocrystal-monohydrate, (MACROBID) 100 MG capsule   0  . NONFORMULARY OR COMPOUNDED ITEM [facility]: Antiinflammatory Cream 120 each 2  . ondansetron (ZOFRAN-ODT) 8 MG disintegrating tablet Take 1 tablet by mouth 3 (three) times daily as needed.  0  . pantoprazole (PROTONIX) 40 MG tablet   1  . promethazine (PHENERGAN) 25 MG tablet Take 25 mg by mouth every 6 (six) hours as needed. for nausea  0  . thyroid (ARMOUR THYROID) 60 MG tablet Take 60 mg by mouth.     Current Facility-Administered Medications  Medication Dose Route Frequency Provider Last Rate Last Dose  . triamcinolone acetonide (KENALOG) 10 MG/ML injection 10 mg  10 mg Other Once Landis Martins, DPM      . triamcinolone acetonide (KENALOG-40) injection 20  mg  20 mg Other Once Landis Martins, DPM         Allergies:  Allergies  Allergen Reactions  . Ciprofloxacin Other (See Comments)    Felt like "bugs crawling" when on Lovenox  . Shellfish-Derived Products Swelling    Shrimp Shrimp  . Bactrim [Sulfamethoxazole-Trimethoprim] Other (See Comments)    headache Headache   . Oxycodone Nausea And Vomiting  . Percocet [Oxycodone-Acetaminophen] Nausea And Vomiting  . Amoxicillin Rash  . Latex  Rash  . Oxycodone-Acetaminophen Nausea And Vomiting  . Penicillins Rash  . Sulfa Antibiotics Rash    Past Medical History, Surgical history, Social history, and Family History were reviewed and updated.  Physical Exam: Blood pressure 122/83, pulse 70, temperature 98.6 F (37 C), temperature source Oral, resp. rate 20, height 5\' 6"  (1.676 m), weight 273 lb (123.8 kg), SpO2 100 %, unknown if currently breastfeeding. ECOG: 0 General appearance: Alert, awake woman without distress.. Head: Normocephalic, without obvious abnormality no oral ulcers or lesions. Neck: no adenopathy Lymph nodes: Cervical, supraclavicular, and axillary nodes normal. Heart:regular rate and rhythm, S1, S2 normal, no murmur, click, rub or gallop Lung:chest clear, no wheezing, rales, normal symmetric air entry Abdomen: soft, non-tender, without masses or organomegaly no shifting dullness or ascites. EXT: Mild edema noted on the left foot and leg. No tenderness or masses noted.  Lab Results: Lab Results  Component Value Date   WBC 10.3 12/01/2016   HGB 13.5 12/01/2016   HCT 40.6 12/01/2016   MCV 90.4 12/01/2016   PLT 262 12/01/2016     Chemistry      Component Value Date/Time   NA 139 08/27/2015 1411   K 3.9 08/27/2015 1411   CL 99 07/01/2013 1115   CO2 28 08/27/2015 1411   BUN 11.5 08/27/2015 1411   CREATININE 0.9 08/27/2015 1411      Component Value Date/Time   CALCIUM 9.7 08/27/2015 1411   ALKPHOS 100 08/27/2015 1411   AST 17 08/27/2015 1411   ALT 18 08/27/2015 1411   BILITOT 0.79 08/27/2015 1411     Iron/TIBC/Ferritin/ %Sat    Component Value Date/Time   IRON 40 (L) 12/01/2016 1505   TIBC 263 12/01/2016 1505   FERRITIN 340 (H) 12/01/2016 1505   IRONPCTSAT 15 (L) 12/01/2016 1505    Impression and Plan:  31 year old woman with:  1. History of pulmonary embolism in the setting of a factor V Leiden heterozygous mutation. She is on long term anticoagulation with Xarelto and have tolerated  it well.  She is desiring pregnancy at this time and would like to switch Xarelto to Lovenox. Given her anticipated pregnancy, I asked her to discontinue Xarelto and we will switch to therapeutic doses of Lovenox at 1.5 mg/kg subcutaneously on a daily basis.  2. Left leg edema: I will obtain a Doppler ultrasound of her lower extremity to rule out deep vein thrombosis. Although it will not alter our management as she will be switched to Lovenox in any case, it is important to document whether she had Xarelto failure and developed a deep vein thrombosis on Xarelto. I do not believe that she developed a blood clot but it's important to rule that out.  3. Irregular menstrual cycles: She continues to follow with OB/GYN regarding this issue. She has been started on Femara to regulate your ovulation and attempt to get pregnant in the near future.  4. Follow-up: Will be in 6 weeks to follow her progress.   [facility], MD 01/10/17

## 2017-01-10 NOTE — Telephone Encounter (Signed)
I sent another scheduling message for 4 pm today.

## 2017-01-10 NOTE — Telephone Encounter (Signed)
I can see her today at 11 am if she can make it.

## 2017-01-11 ENCOUNTER — Telehealth: Payer: Self-pay | Admitting: Oncology

## 2017-01-11 ENCOUNTER — Encounter: Payer: Self-pay | Admitting: *Deleted

## 2017-01-11 ENCOUNTER — Other Ambulatory Visit: Payer: Self-pay | Admitting: *Deleted

## 2017-01-11 MED ORDER — ENOXAPARIN SODIUM 150 MG/ML ~~LOC~~ SOLN: 150.0000 mg | SUBCUTANEOUS | 0 refills | Status: DC

## 2017-01-11 NOTE — Telephone Encounter (Signed)
Called to confirm appt for Doppler - changed location . Patient wanted doppler done at [facility] rather than [facility].

## 2017-01-11 NOTE — Telephone Encounter (Signed)
Patient calling to say her insurance will not pay for 185 mg of lovenox, they will pay for 150 mg in one syringe. Okay per dr Alen Blew. New order e-scribed to [facility] in  [place]. Pharmacist kevin to notify patient of new script.

## 2017-01-12 ENCOUNTER — Ambulatory Visit (HOSPITAL_COMMUNITY): Payer: BLUE CROSS/BLUE SHIELD

## 2017-01-12 ENCOUNTER — Telehealth: Payer: Self-pay | Admitting: *Deleted

## 2017-01-12 ENCOUNTER — Ambulatory Visit (HOSPITAL_COMMUNITY)
Admission: RE | Admit: 2017-01-12 | Discharge: 2017-01-12 | Disposition: A | Discharge status: Home / Self Care | Payer: BLUE CROSS/BLUE SHIELD | Source: Ambulatory Visit | Attending: Oncology | Admitting: Oncology

## 2017-01-12 DIAGNOSIS — D6851 Activated protein C resistance: Secondary | ICD-10-CM | POA: Diagnosis not present

## 2017-01-12 NOTE — Progress Notes (Signed)
VASCULAR LAB PRELIMINARY  PRELIMINARY  PRELIMINARY  PRELIMINARY  Left lower extremity venous duplex completed.    Preliminary report:  Left:  No evidence of DVT, superficial thrombosis, or Baker's cyst.  Myleah Cavendish, RVS 01/12/2017, 4:13 PM

## 2017-01-12 NOTE — Telephone Encounter (Signed)
rec'd call from vascular lab. Doppler negative for DVT.

## 2017-01-16 ENCOUNTER — Telehealth: Payer: Self-pay | Admitting: *Deleted

## 2017-01-16 NOTE — Telephone Encounter (Signed)
-----   Message from Wyatt Portela, MD sent at 01/15/2017  9:16 AM EDT ----- Please let her know her dopplers are negative. No DVT

## 2017-01-16 NOTE — Telephone Encounter (Signed)
As noted below by Dr. Alen Blew,  I informed patient that her dopplers are negative. There is no DVT. Patient verbalized understanding.

## 2017-03-01 ENCOUNTER — Other Ambulatory Visit: Payer: BLUE CROSS/BLUE SHIELD

## 2017-03-01 ENCOUNTER — Ambulatory Visit: Payer: BLUE CROSS/BLUE SHIELD | Admitting: Oncology

## 2017-03-02 ENCOUNTER — Other Ambulatory Visit: Payer: BLUE CROSS/BLUE SHIELD

## 2017-03-02 ENCOUNTER — Ambulatory Visit: Payer: BLUE CROSS/BLUE SHIELD | Admitting: Oncology

## 2017-03-12 ENCOUNTER — Other Ambulatory Visit: Payer: Self-pay | Admitting: Gastroenterology

## 2017-03-12 DIAGNOSIS — R945 Abnormal results of liver function studies: Secondary | ICD-10-CM

## 2017-03-14 ENCOUNTER — Other Ambulatory Visit (HOSPITAL_BASED_OUTPATIENT_CLINIC_OR_DEPARTMENT_OTHER): Payer: BLUE CROSS/BLUE SHIELD

## 2017-03-14 ENCOUNTER — Ambulatory Visit (HOSPITAL_BASED_OUTPATIENT_CLINIC_OR_DEPARTMENT_OTHER): Payer: BLUE CROSS/BLUE SHIELD | Admitting: Oncology

## 2017-03-14 VITALS — BP 113/76 | HR 73 | Temp 98.6°F | Resp 19 | Wt 268.5 lb

## 2017-03-14 DIAGNOSIS — R51 Headache: Secondary | ICD-10-CM

## 2017-03-14 DIAGNOSIS — R1011 Right upper quadrant pain: Secondary | ICD-10-CM | POA: Diagnosis not present

## 2017-03-14 DIAGNOSIS — N926 Irregular menstruation, unspecified: Secondary | ICD-10-CM

## 2017-03-14 DIAGNOSIS — D6851 Activated protein C resistance: Secondary | ICD-10-CM | POA: Diagnosis not present

## 2017-03-14 DIAGNOSIS — D500 Iron deficiency anemia secondary to blood loss (chronic): Secondary | ICD-10-CM

## 2017-03-14 DIAGNOSIS — Z7901 Long term (current) use of anticoagulants: Secondary | ICD-10-CM | POA: Diagnosis not present

## 2017-03-14 DIAGNOSIS — Z86711 Personal history of pulmonary embolism: Secondary | ICD-10-CM

## 2017-03-14 LAB — CBC WITH DIFFERENTIAL/PLATELET
BASO%: 0.4 % (ref 0.0–2.0)
BASOS ABS: 0 10*3/uL (ref 0.0–0.1)
EOS ABS: 0.1 10*3/uL (ref 0.0–0.5)
EOS%: 1.4 % (ref 0.0–7.0)
HEMATOCRIT: 41 % (ref 34.8–46.6)
HEMOGLOBIN: 13.9 g/dL (ref 11.6–15.9)
LYMPH#: 1.6 10*3/uL (ref 0.9–3.3)
LYMPH%: 22.6 % (ref 14.0–49.7)
MCH: 30.2 pg (ref 25.1–34.0)
MCHC: 33.9 g/dL (ref 31.5–36.0)
MCV: 89.1 fL (ref 79.5–101.0)
MONO#: 0.5 10*3/uL (ref 0.1–0.9)
MONO%: 6.4 % (ref 0.0–14.0)
NEUT#: 4.9 10*3/uL (ref 1.5–6.5)
NEUT%: 69.2 % (ref 38.4–76.8)
PLATELETS: 250 10*3/uL (ref 145–400)
RBC: 4.6 10*6/uL (ref 3.70–5.45)
RDW: 12.5 % (ref 11.2–14.5)
WBC: 7.1 10*3/uL (ref 3.9–10.3)

## 2017-03-14 NOTE — Progress Notes (Signed)
Hematology and Oncology Follow Up Visit  Robin Key [id number] 1986-07-25 31 y.o. 03/14/2017 5:25 PM Robin Key, [name], [name], NP   Principle Diagnosis: 31 year old woman with:  1. History of pulmonary embolism in the setting of a heterozygous factor V Leiden mutation. This was diagnosed in 2012. She had previous deep vein thrombosis that was treated with Xarelto.  2. 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  Prior Therapy:  Full dose anticoagulation with Lovenox after she had a C-section on 07/01/2013.  She is status post IV iron in the form of Feraheme on multiple occasions.   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 She was switched to Lovenox in June 2018. IV iron intermittently for iron deficiency anemia due to menorrhagia.  Interim History:  Robin Key presents today for a followup visit. Since the last visit, she reports developing right upper quadrant abdominal pain and periodic headaches. She is currently under evaluation for possible bile duct stones as the cause of her abdominal pain. She might require surgical intervention at that time.  She remains on Lovenox long-term without any complications so far. Her menstrual cycles have been regular and not heavy. She is reporting some ice cravings but no dyspnea exertion or excessive fatigue. She continues to work full time.  She does not report any headaches, blurry vision or syncope. She does not report any fevers, chills, sweats or weight loss. She does not report any chest pain or dyspnea on exertion. She does not report any cough, wheezing or hemoptysis. She does not report any nausea or vomiting or abdominal pain. Did not report any neurological symptoms. Remainder of her review of systems unremarkable.   Medications: I have reviewed the patient's current medications.  Current Outpatient Prescriptions  Medication Sig Dispense Refill  .  butalbital-acetaminophen-caffeine (FIORICET, ESGIC) 50-325-40 MG per tablet Take 2 tablets by mouth every 6 (six) hours as needed for headache. 30 tablet 1  . cetirizine (ZYRTEC) 10 MG tablet Take 10 mg by mouth daily.    . diclofenac sodium (VOLTAREN) 1 % GEL Apply 4 g topically 4 (four) times daily. 100 g 2  . enoxaparin (LOVENOX) 150 MG/ML injection Inject 1 mL (150 mg total) into the skin daily. 30 mL 0  . fluticasone (FLONASE) 50 MCG/ACT nasal spray Place 2 sprays into both nostrils daily.    Marland Kitchen letrozole (FEMARA) 2.5 MG tablet Take 5 mg by mouth.    . medroxyPROGESTERone (PROVERA) 10 MG tablet TK 1 T PO  D  0  . medroxyPROGESTERone (PROVERA) 10 MG tablet TK 1 T PO  D    . methylPREDNISolone (MEDROL DOSEPAK) 4 MG TBPK tablet Take as instructed 21 tablet 0  . nitrofurantoin, macrocrystal-monohydrate, (MACROBID) 100 MG capsule   0  . NONFORMULARY OR COMPOUNDED ITEM [facility]: Antiinflammatory Cream 120 each 2  . ondansetron (ZOFRAN-ODT) 8 MG disintegrating tablet Take 1 tablet by mouth 3 (three) times daily as needed.  0  . pantoprazole (PROTONIX) 40 MG tablet   1  . promethazine (PHENERGAN) 25 MG tablet Take 25 mg by mouth every 6 (six) hours as needed. for nausea  0  . thyroid (ARMOUR THYROID) 60 MG tablet Take 60 mg by mouth.     Current Facility-Administered Medications  Medication Dose Route Frequency Provider Last Rate Last Dose  . triamcinolone acetonide (KENALOG) 10 MG/ML injection 10 mg  10 mg Other Once Landis Martins, DPM      .  triamcinolone acetonide (KENALOG-40) injection 20 mg  20 mg Other Once Landis Martins, DPM         Allergies:  Allergies  Allergen Reactions  . Ciprofloxacin Other (See Comments)    Felt like "bugs crawling" when on Lovenox  . Shellfish-Derived Products Swelling    Shrimp Shrimp  . Bactrim [Sulfamethoxazole-Trimethoprim] Other (See Comments)    headache Headache   . Oxycodone Nausea And Vomiting  . Percocet [Oxycodone-Acetaminophen]  Nausea And Vomiting  . Amoxicillin Rash  . Latex Rash  . Oxycodone-Acetaminophen Nausea And Vomiting  . Penicillins Rash  . Sulfa Antibiotics Rash    Past Medical History, Surgical history, Social history, and Family History were reviewed and updated.  Physical Exam:  ECOG: 0 General appearance: well-appearing woman without distress. Head: Normocephalic, without obvious abnormality no oral thrush or ulcers. Neck: no adenopathy Lymph nodes: Cervical, supraclavicular, and axillary nodes normal. Heart:regular rate and rhythm, S1, S2 normal, no murmur, click, rub or gallop Lung:chest clear, no wheezing, rales, normal symmetric air entry Abdomen: soft, non-tender, without masses or organomegaly no rebound or guarding. EXT: No edema noted.  Lab Results: Lab Results  Component Value Date   WBC 7.1 03/14/2017   HGB 13.9 03/14/2017   HCT 41.0 03/14/2017   MCV 89.1 03/14/2017   PLT 250 03/14/2017     Chemistry      Component Value Date/Time   NA 139 08/27/2015 1411   K 3.9 08/27/2015 1411   CL 99 07/01/2013 1115   CO2 28 08/27/2015 1411   BUN 11.5 08/27/2015 1411   CREATININE 0.9 08/27/2015 1411      Component Value Date/Time   CALCIUM 9.7 08/27/2015 1411   ALKPHOS 100 08/27/2015 1411   AST 17 08/27/2015 1411   ALT 18 08/27/2015 1411   BILITOT 0.79 08/27/2015 1411     Iron/TIBC/Ferritin/ %Sat    Component Value Date/Time   IRON 40 (L) 12/01/2016 1505   TIBC 263 12/01/2016 1505   FERRITIN 340 (H) 12/01/2016 1505   IRONPCTSAT 15 (L) 12/01/2016 1505    Impression and Plan:  31 year old woman with:  1. History of pulmonary embolism in the setting of a factor V Leiden heterozygous mutation. She is on long term anticoagulation with Xarelto and have tolerated it well.  She is desiring pregnancy and currently on Lovenox in anticipation of pregnancy. She will continue on Lovenox for the time being.  2 Iron deficiency anemia: She received iron infusion in May 2018 and  will be repeated as needed. Her hemoglobin appears normal today and we'll await her iron studies.  3. Irregular menstrual cycles: She continues to follow with OB/GYN regarding this issue. She has been started on Femara to regulate your ovulation and attempt to get pregnant in the near future.  4. Follow-up: Will be in 3 months.   [facility], MD 03/14/17

## 2017-03-15 LAB — FERRITIN: FERRITIN: 321 ng/mL — AB (ref 9–269)

## 2017-03-15 LAB — IRON AND TIBC
%SAT: 28 % (ref 21–57)
IRON: 72 ug/dL (ref 41–142)
TIBC: 263 ug/dL (ref 236–444)
UIBC: 190 ug/dL (ref 120–384)

## 2017-03-19 ENCOUNTER — Ambulatory Visit (HOSPITAL_COMMUNITY)
Admission: RE | Admit: 2017-03-19 | Discharge: 2017-03-19 | Disposition: A | Discharge status: Home / Self Care | Payer: BLUE CROSS/BLUE SHIELD | Source: Ambulatory Visit | Attending: Gastroenterology | Admitting: Gastroenterology

## 2017-03-19 ENCOUNTER — Telehealth: Payer: Self-pay | Admitting: Oncology

## 2017-03-19 ENCOUNTER — Ambulatory Visit (HOSPITAL_COMMUNITY): Payer: BLUE CROSS/BLUE SHIELD

## 2017-03-19 ENCOUNTER — Other Ambulatory Visit: Payer: Self-pay | Admitting: Gastroenterology

## 2017-03-19 DIAGNOSIS — R1011 Right upper quadrant pain: Secondary | ICD-10-CM | POA: Insufficient documentation

## 2017-03-19 DIAGNOSIS — R945 Abnormal results of liver function studies: Secondary | ICD-10-CM | POA: Diagnosis present

## 2017-03-19 DIAGNOSIS — R160 Hepatomegaly, not elsewhere classified: Secondary | ICD-10-CM | POA: Diagnosis not present

## 2017-03-19 MED ORDER — GADOBENATE DIMEGLUMINE 529 MG/ML IV SOLN
20.0000 mL | Freq: Once | INTRAVENOUS | Status: AC | PRN
  Administered 2017-03-19: 20 mL via INTRAVENOUS

## 2017-03-19 NOTE — Telephone Encounter (Signed)
Spoke with patient about lab appointment for November. No other orders per 8/8 los.

## 2017-03-21 ENCOUNTER — Encounter: Payer: Self-pay | Admitting: Oncology

## 2017-03-22 ENCOUNTER — Encounter: Payer: Self-pay | Admitting: *Deleted

## 2017-03-24 ENCOUNTER — Other Ambulatory Visit: Payer: BLUE CROSS/BLUE SHIELD

## 2017-03-26 ENCOUNTER — Other Ambulatory Visit: Payer: Self-pay | Admitting: Oncology

## 2017-04-03 MED ORDER — GADOBENATE DIMEGLUMINE 529 MG/ML IV SOLN
20.0000 mL | Freq: Once | INTRAVENOUS | Status: AC | PRN
  Administered 2017-03-19: 20 mL via INTRAVENOUS

## 2017-04-25 ENCOUNTER — Other Ambulatory Visit: Payer: Self-pay | Admitting: Oncology

## 2017-05-01 ENCOUNTER — Ambulatory Visit (INDEPENDENT_AMBULATORY_CARE_PROVIDER_SITE_OTHER): Payer: BLUE CROSS/BLUE SHIELD | Admitting: Podiatry

## 2017-05-01 DIAGNOSIS — M722 Plantar fascial fibromatosis: Secondary | ICD-10-CM | POA: Diagnosis not present

## 2017-05-06 NOTE — Progress Notes (Addendum)
   Subjective:    Patient ID: Robin Key, female    DOB: 03/01/1986, 31 y.o.   MRN: [id number]  HPI 31 y.o. female returns for f/u of L heel pain. States the pain has returned for about a month. Admits she has not been stretching. Denies other pedal issues.  Review of Systems     Objective:   Physical Exam There were no vitals filed for this visit. General AA&O x3. Normal mood and affect.  Vascular Dorsalis pedis and posterior tibial pulses  present 2+ bilaterally  Capillary refill normal to all digits. Pedal hair growth normal.  Neurologic Epicritic sensation grossly present bilaterally.  Dermatologic No open lesions. Interspaces clear of maceration. Nails well groomed and normal in appearance.  Orthopedic: MMT 5/5 in dorsiflexion, plantarflexion, inversion, and eversion bilaterally. Tender to palpation at the calcaneal tuber left. No pain with calcaneal squeeze left. Ankle ROM diminished range of motion left. Silfverskiold Test: positive left.     Assessment & Plan:  Plantar Fasciitis, L -Repeat injection as below. -Will avoid NSAIDs due to bleeding risk. -Continue stretching.  Procedure: Injection Tendon/Ligament Location: Left plantar fascia at the glabrous junction; medial approach. Skin Prep: Alcohol. Injectate: 1 cc 0.5% marcaine plain, 1 cc dexamethasone phosphate, 0.5 cc kenalog 10. Disposition: Patient tolerated procedure well. Injection site dressed with a band-aid.  Return in about 6 weeks (around 06/12/2017).

## 2017-05-23 ENCOUNTER — Other Ambulatory Visit: Payer: Self-pay | Admitting: Oncology

## 2017-06-12 ENCOUNTER — Ambulatory Visit: Payer: BLUE CROSS/BLUE SHIELD | Admitting: Podiatry

## 2017-06-14 ENCOUNTER — Other Ambulatory Visit: Payer: BLUE CROSS/BLUE SHIELD

## 2017-07-02 ENCOUNTER — Other Ambulatory Visit: Payer: Self-pay | Admitting: Oncology

## 2017-07-26 ENCOUNTER — Other Ambulatory Visit: Payer: Self-pay | Admitting: Oncology

## 2017-08-21 ENCOUNTER — Ambulatory Visit (INDEPENDENT_AMBULATORY_CARE_PROVIDER_SITE_OTHER): Payer: BLUE CROSS/BLUE SHIELD | Admitting: Podiatry

## 2017-08-21 DIAGNOSIS — M722 Plantar fascial fibromatosis: Secondary | ICD-10-CM

## 2017-08-21 MED ORDER — MELOXICAM 15 MG PO TABS
15.0000 mg | ORAL_TABLET | Freq: Every day | ORAL | 0 refills | Status: DC
Start: 2017-08-21 — End: 2022-06-08

## 2017-08-21 NOTE — Progress Notes (Signed)
  Subjective:  Patient ID: Robin Key, female    DOB: [birth year],  MRN: [id number]  Chief Complaint  Patient presents with  . Plantar Fasciitis    heel has gotten very painful    32 y.o. female returns for the above complaint.  States that the left heel is no better it feels like there is a shooting on the outside leg.  States that the right side is also starting to ache.  Objective:  There were no vitals filed for this visit. General AA&O x3. Normal mood and affect.  Vascular Pedal pulses palpable.  Neurologic Epicritic sensation grossly intact.  Dermatologic No open lesions. Skin normal texture and turgor.  Orthopedic: Pain to palpation medial calcaneal tuber left greater than right   Assessment & Plan:  Patient was evaluated and treated and all questions answered.  Plantar Fasciitis -Continue stretching. -Rx meloxicam  No Follow-up on file.

## 2017-09-07 ENCOUNTER — Ambulatory Visit: Payer: BLUE CROSS/BLUE SHIELD | Admitting: Oncology

## 2017-09-07 ENCOUNTER — Other Ambulatory Visit: Payer: BLUE CROSS/BLUE SHIELD

## 2017-09-25 ENCOUNTER — Ambulatory Visit: Payer: BLUE CROSS/BLUE SHIELD | Admitting: Podiatry

## 2017-12-26 ENCOUNTER — Telehealth: Payer: Self-pay | Admitting: *Deleted

## 2017-12-26 ENCOUNTER — Telehealth: Payer: Self-pay | Admitting: Oncology

## 2017-12-26 NOTE — Telephone Encounter (Signed)
Patient called in to reschedule her previous appointment and also asked to be transferred to Dr Hazeline Junker nurse due to questions

## 2017-12-26 NOTE — Telephone Encounter (Signed)
Patient calling.states she stopped lovenox injections a month ago, d/t discomfort around her mid section from injections. States her period has lasted for 3 weeks now. she would like for dr Alen Blew to put her on xaralto. Her next appt is June 25th

## 2017-12-26 NOTE — Telephone Encounter (Signed)
I can see her on 5/24 at 11:00 am. Please send a message to scheduling.

## 2017-12-27 ENCOUNTER — Telehealth: Payer: Self-pay | Admitting: *Deleted

## 2017-12-27 NOTE — Telephone Encounter (Signed)
Los to schedulers to schedule first available, patient has issues with day and time for visits.

## 2018-01-02 ENCOUNTER — Telehealth: Payer: Self-pay | Admitting: Oncology

## 2018-01-02 ENCOUNTER — Inpatient Hospital Stay: Payer: BLUE CROSS/BLUE SHIELD | Attending: Oncology | Admitting: Oncology

## 2018-01-02 ENCOUNTER — Inpatient Hospital Stay: Payer: BLUE CROSS/BLUE SHIELD

## 2018-01-02 VITALS — BP 121/77 | HR 71 | Temp 98.7°F | Resp 18 | Ht 66.0 in | Wt 267.8 lb

## 2018-01-02 DIAGNOSIS — D500 Iron deficiency anemia secondary to blood loss (chronic): Secondary | ICD-10-CM | POA: Diagnosis not present

## 2018-01-02 DIAGNOSIS — Z79899 Other long term (current) drug therapy: Secondary | ICD-10-CM | POA: Insufficient documentation

## 2018-01-02 DIAGNOSIS — N920 Excessive and frequent menstruation with regular cycle: Secondary | ICD-10-CM | POA: Insufficient documentation

## 2018-01-02 DIAGNOSIS — Z8672 Personal history of thrombophlebitis: Secondary | ICD-10-CM | POA: Insufficient documentation

## 2018-01-02 DIAGNOSIS — Z7901 Long term (current) use of anticoagulants: Secondary | ICD-10-CM | POA: Insufficient documentation

## 2018-01-02 DIAGNOSIS — I2699 Other pulmonary embolism without acute cor pulmonale: Secondary | ICD-10-CM | POA: Insufficient documentation

## 2018-01-02 DIAGNOSIS — D6851 Activated protein C resistance: Secondary | ICD-10-CM | POA: Diagnosis not present

## 2018-01-02 LAB — CBC WITH DIFFERENTIAL/PLATELET
BASOS PCT: 0 %
Basophils Absolute: 0 10*3/uL (ref 0.0–0.1)
EOS ABS: 0.1 10*3/uL (ref 0.0–0.5)
Eosinophils Relative: 2 %
HCT: 38.1 % (ref 34.8–46.6)
Hemoglobin: 12.8 g/dL (ref 11.6–15.9)
Lymphocytes Relative: 24 %
Lymphs Abs: 1.8 10*3/uL (ref 0.9–3.3)
MCH: 29.8 pg (ref 25.1–34.0)
MCHC: 33.6 g/dL (ref 31.5–36.0)
MCV: 88.6 fL (ref 79.5–101.0)
MONO ABS: 0.4 10*3/uL (ref 0.1–0.9)
MONOS PCT: 5 %
NEUTROS PCT: 69 %
Neutro Abs: 5.3 10*3/uL (ref 1.5–6.5)
Platelets: 259 10*3/uL (ref 145–400)
RBC: 4.3 MIL/uL (ref 3.70–5.45)
RDW: 13 % (ref 11.2–14.5)
WBC: 7.7 10*3/uL (ref 3.9–10.3)

## 2018-01-02 MED ORDER — RIVAROXABAN 20 MG PO TABS: 20.0000 mg | ORAL_TABLET | Freq: Every day | ORAL | 3 refills | Status: DC

## 2018-01-02 NOTE — Telephone Encounter (Signed)
Appointments scheduled AVS/Calendar printed per 5/29 los °

## 2018-01-02 NOTE — Progress Notes (Signed)
Hematology and Oncology Follow Up Visit  Robin Key [id number] 1986/01/05 32 y.o. 01/02/2018 3:32 PM Robin Key, Robin Key, [name], Robin Melena, NP   Principle Diagnosis: 32 year old woman with:  1. Pulmonary embolism in the setting of a heterozygous factor V Leiden mutation diagnosed in 2012.  She developed superficial phlebitis in January 2015.  2. 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  Prior Therapy:  Full dose anticoagulation with Lovenox after she had a C-section on 07/01/2013.   She is status post IV iron in the form of Feraheme as needed.  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 She was switched to Lovenox in June 2018.  Therapy discontinued in March 2019.  Current therapy:   Under consideration to start anticoagulation.  IV iron intermittently for iron deficiency anemia due to menorrhagia.  Interim History:  [name] is here for a follow-up.  Since last visit, she reports no major changes in her health.  She has received full dose anticoagulation until about March 2019 and has been switched to Lovenox in anticipation of pregnancy.  She is no longer desiring pregnancy at this time and have discontinued all anticoagulation.  She did report an increase in her menstrual bleeding this month periodically.  She is concerned about the risk of thrombosis in anticipation of starting Provera to regulate her menstrual cycle.  She remains active and attends to activities of daily living.  She does report some mild fatigue but no dyspnea on exertion.  She denies any hematochezia or Key.  She does not report any headaches, blurry vision or syncope.  He denies any seizures or mental status changes.  She does not report any fevers, chills, sweats or weight loss. She does not report any chest pain, palpitation or orthopnea.  She does not report any cough, wheezing or hemoptysis. She does not report any nausea or vomiting or  abdominal pain.  She does not report any frequency urgency or hesitancy.  She does not report any hematuria or dysuria.  He does not report any lymphadenopathy or petechiae.  Remainder of her review of systems is negative.   Medications: I have reviewed the patient's current medications.  Current Outpatient Medications  Medication Sig Dispense Refill  . butalbital-acetaminophen-caffeine (FIORICET, ESGIC) 50-325-40 MG per tablet Take 2 tablets by mouth every 6 (six) hours as needed for headache. 30 tablet 1  . cetirizine (ZYRTEC) 10 MG tablet Take 10 mg by mouth daily.    . diclofenac sodium (VOLTAREN) 1 % GEL Apply 4 g topically 4 (four) times daily. 100 g 2  . fluticasone (FLONASE) 50 MCG/ACT nasal spray Place 2 sprays into both nostrils daily.    Marland Kitchen letrozole (FEMARA) 2.5 MG tablet Take 5 mg by mouth.    . medroxyPROGESTERone (PROVERA) 10 MG tablet TK 1 T PO  D  0  . medroxyPROGESTERone (PROVERA) 10 MG tablet TK 1 T PO  D    . meloxicam (MOBIC) 15 MG tablet Take 1 tablet (15 mg total) by mouth daily. 30 tablet 0  . methylPREDNISolone (MEDROL DOSEPAK) 4 MG TBPK tablet Take as instructed 21 tablet 0  . nitrofurantoin, macrocrystal-monohydrate, (MACROBID) 100 MG capsule   0  . NONFORMULARY OR COMPOUNDED ITEM [facility]: Antiinflammatory Cream 120 each 2  . ondansetron (ZOFRAN-ODT) 8 MG disintegrating tablet Take 1 tablet by mouth 3 (three) times daily as needed.  0  . pantoprazole (PROTONIX) 40 MG tablet   1  . promethazine (PHENERGAN) 25  MG tablet Take 25 mg by mouth every 6 (six) hours as needed. for nausea  0  . rivaroxaban (XARELTO) 20 MG TABS tablet Take 1 tablet (20 mg total) by mouth daily with supper. 30 tablet 3  . thyroid (ARMOUR THYROID) 60 MG tablet Take 60 mg by mouth.     Current Facility-Administered Medications  Medication Dose Route Frequency Provider Last Rate Last Dose  . triamcinolone acetonide (KENALOG) 10 MG/ML injection 10 mg  10 mg Other Once [place], [place], DPM       . triamcinolone acetonide (KENALOG-40) injection 20 mg  20 mg Other Once Landis Martins, DPM         Allergies:  Allergies  Allergen Reactions  . Ciprofloxacin Other (See Comments)    Felt like "bugs crawling" when on Lovenox  . Shellfish-Derived Products Swelling    Shrimp Shrimp  . Bactrim [Sulfamethoxazole-Trimethoprim] Other (See Comments)    headache Headache   . Oxycodone Nausea And Vomiting  . Percocet [Oxycodone-Acetaminophen] Nausea And Vomiting  . Amoxicillin Rash  . Latex Rash  . Oxycodone-Acetaminophen Nausea And Vomiting  . Penicillins Rash  . Sulfa Antibiotics Rash    Past Medical History, Surgical history, Social history, and Family History were reviewed and updated.  Physical Exam: Blood pressure 121/77, pulse 71, temperature 98.7 F (37.1 C), temperature source Oral, resp. rate 18, height 5\' 6"  (1.676 m), weight 267 lb 12.8 oz (121.5 kg), SpO2 100 %, unknown if currently breastfeeding.   ECOG: 0 General appearance: Alert, awake woman without distress. Head: Atraumatic without abnormalities. Oropharynx: Without any thrush or ulcers. Lymph nodes: No lymphadenopathy noted in the cervical, supraclavicular, and axillary nodes Heart:Regular rate and rhythm without any murmurs or gallops. Lung: Clear to auscultation without any rhonchi, wheezes or dullness to percussion. Abdomen: Soft, nontender without any rebound or guarding. Musculoskeletal: No joint deformity or effusion. Skin: No rashes or lesions.  Lab Results: Lab Results  Component Value Date   WBC 7.7 01/02/2018   HGB 12.8 01/02/2018   HCT 38.1 01/02/2018   MCV 88.6 01/02/2018   PLT 259 01/02/2018     Chemistry      Component Value Date/Time   NA 139 08/27/2015 1411   K 3.9 08/27/2015 1411   CL 99 07/01/2013 1115   CO2 28 08/27/2015 1411   BUN 11.5 08/27/2015 1411   CREATININE 0.9 08/27/2015 1411      Component Value Date/Time   CALCIUM 9.7 08/27/2015 1411   ALKPHOS 100  08/27/2015 1411   AST 17 08/27/2015 1411   ALT 18 08/27/2015 1411   BILITOT 0.79 08/27/2015 1411     Iron/TIBC/Ferritin/ %Sat    Component Value Date/Time   IRON 72 03/14/2017 1510   TIBC 263 03/14/2017 1510   FERRITIN 321 (H) 03/14/2017 1510   IRONPCTSAT 28 03/14/2017 1510    Impression and Plan:  32 year old woman with:  1.  Recurrent thrombosis in the setting of factor V Leiden heterozygous mutation.  She has been on Lovenox and Xarelto in the past for deep vein thrombosis as well as phlebitis.  She is desiring to be on Provera to regulate her menstrual cycles as well as planning a long trip to [place].  She is inquiring about restarting anticoagulation at this time.  The risks and benefits of restarting Xarelto was reviewed today.  Complications would include increased bleeding especially menstrual bleeding among others.  The benefits of starting anticoagulation will offer protection against thrombosis especially in a anticipated increased risk situation including  travel and possible hormone therapy.  After discussion today she is agreeable to proceed with Xarelto 20 mg daily to start immediately.  2 Iron deficiency anemia: We will check her iron studies periodically and replace with intravenous iron as needed.  Her iron needs may need given her increase menstrual bleeding.   3. Irregular menstrual cycles: She has follow-up with OB/GYN and anticipating starting Provera in the future.  4. Follow-up: Will be in 3 months.  15 minutes was spent with the patient face-to-face today.  More than 50% of time was dedicated to patient counseling, education and coordination of her care.   Zola Button, MD 01/02/18

## 2018-01-03 ENCOUNTER — Telehealth: Payer: Self-pay | Admitting: *Deleted

## 2018-01-03 ENCOUNTER — Telehealth: Payer: Self-pay | Admitting: Oncology

## 2018-01-03 LAB — IRON AND TIBC
Iron: 35 ug/dL — ABNORMAL LOW (ref 41–142)
Saturation Ratios: 14 % — ABNORMAL LOW (ref 21–57)
TIBC: 249 ug/dL (ref 236–444)
UIBC: 213 ug/dL

## 2018-01-03 LAB — FERRITIN: FERRITIN: 149 ng/mL (ref 9–269)

## 2018-01-03 NOTE — Addendum Note (Signed)
Addended by: Wyatt Portela on: 01/03/2018 10:37 AM   Modules accepted: Orders

## 2018-01-03 NOTE — Telephone Encounter (Signed)
-----   Message from Wyatt Portela, MD sent at 01/03/2018 10:34 AM EDT ----- Please let her know her iron is little low. She would benefit from one time IV iron infusion. I will send a message to scheduling. She will be contacted soon.

## 2018-01-03 NOTE — Telephone Encounter (Signed)
As noted below by Dr. Alen Blew, I informed patient of her iron level. She will need to receive IV iron one time. Someone from scheduling will be calling her. She verbalized understanding.

## 2018-01-03 NOTE — Telephone Encounter (Signed)
Scheduled appt per 5/29 sch msg - couldn't leave vm

## 2018-01-09 ENCOUNTER — Encounter: Payer: Self-pay | Admitting: Sports Medicine

## 2018-01-09 ENCOUNTER — Ambulatory Visit: Payer: BLUE CROSS/BLUE SHIELD | Admitting: Sports Medicine

## 2018-01-09 DIAGNOSIS — M7672 Peroneal tendinitis, left leg: Secondary | ICD-10-CM

## 2018-01-09 DIAGNOSIS — M79672 Pain in left foot: Secondary | ICD-10-CM

## 2018-01-09 DIAGNOSIS — M79671 Pain in right foot: Secondary | ICD-10-CM

## 2018-01-09 DIAGNOSIS — M722 Plantar fascial fibromatosis: Secondary | ICD-10-CM

## 2018-01-09 MED ORDER — METHYLPREDNISOLONE 4 MG PO TBPK: ORAL_TABLET | ORAL | 0 refills | Status: DC

## 2018-01-09 MED ORDER — TRIAMCINOLONE ACETONIDE 40 MG/ML IJ SUSP: 20.0000 mg | Freq: Once | INTRAMUSCULAR | Status: DC

## 2018-01-09 NOTE — Progress Notes (Signed)
Subjective: Robin Key is a 32 y.o. female patient who returns to office with complaint of heel pain on the left>right; states that pain has flared pain up, 5/10 L>R at heels and at left ankle. States that Mobic helped but had to stop it because was put back on Xarelto. Patient states that she is really trying to get better before her [place] trip next month. Patient states that she is not sure what else she needs to do has been trying everything before coming here. Denies any other pedal complaints.   Patient Active Problem List   Diagnosis Date Noted  . Cholecystitis, chronic 10/18/2015  . Cholecystitis 10/15/2015  . Leg pain 10/15/2014  . 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 Varicose veins of lower extremities with other complications 19/37/9024  . Pain and swelling of lower extremity 10/08/2013  . Anemia 09/08/2013  . Personal history of venous thrombosis and embolism 09/08/2013  . Cesarean delivery delivered 07/01/2013    Current Outpatient Medications on File Prior to Visit  Medication Sig Dispense Refill  . butalbital-acetaminophen-caffeine (FIORICET, ESGIC) 50-325-40 MG per tablet Take 2 tablets by mouth every 6 (six) hours as needed for headache. 30 tablet 1  . cetirizine (ZYRTEC) 10 MG tablet Take 10 mg by mouth daily.    . diclofenac sodium (VOLTAREN) 1 % GEL Apply 4 g topically 4 (four) times daily. 100 g 2  . fluticasone (FLONASE) 50 MCG/ACT nasal spray Place 2 sprays into both nostrils daily.    Marland Kitchen letrozole (FEMARA) 2.5 MG tablet Take 5 mg by mouth.    . medroxyPROGESTERone (PROVERA) 10 MG tablet TK 1 T PO  D  0  . medroxyPROGESTERone (PROVERA) 10 MG tablet TK 1 T PO  D    . meloxicam (MOBIC) 15 MG tablet Take 1 tablet (15 mg total) by mouth daily. 30 tablet 0  . nitrofurantoin, macrocrystal-monohydrate, (MACROBID) 100 MG capsule   0  . NONFORMULARY OR COMPOUNDED ITEM [facility]: Antiinflammatory Cream 120 each 2  . ondansetron  (ZOFRAN-ODT) 8 MG disintegrating tablet Take 1 tablet by mouth 3 (three) times daily as needed.  0  . pantoprazole (PROTONIX) 40 MG tablet   1  . promethazine (PHENERGAN) 25 MG tablet Take 25 mg by mouth every 6 (six) hours as needed. for nausea  0  . rivaroxaban (XARELTO) 20 MG TABS tablet Take 1 tablet (20 mg total) by mouth daily with supper. 30 tablet 3  . thyroid (ARMOUR THYROID) 60 MG tablet Take 60 mg by mouth.     Current Facility-Administered Medications on File Prior to Visit  Medication Dose Route Frequency Provider Last Rate Last Dose  . triamcinolone acetonide (KENALOG) 10 MG/ML injection 10 mg  10 mg Other Once [place], Keon Pender, DPM      . triamcinolone acetonide (KENALOG-40) injection 20 mg  20 mg Other Once Landis Martins, DPM        Allergies  Allergen Reactions  . Ciprofloxacin Other (See Comments)    Felt like "bugs crawling" when on Lovenox  . Shellfish-Derived Products Swelling    Shrimp Shrimp  . Bactrim [Sulfamethoxazole-Trimethoprim] Other (See Comments)    headache Headache   . Oxycodone Nausea And Vomiting  . Percocet [Oxycodone-Acetaminophen] Nausea And Vomiting  . Amoxicillin Rash  . Latex Rash  . Oxycodone-Acetaminophen Nausea And Vomiting  . Penicillins Rash  . Sulfa Antibiotics Rash    Objective: Physical Exam General: The patient is alert and oriented x3 in no acute distress.  Dermatology: Skin is warm, dry and supple bilateral lower extremities. Nails 1-10 are normal. There is no erythema, + focal edema bilateral ankle, no eccymosis, no open lesions present. Integument is otherwise unremarkable.  Vascular: Dorsalis Pedis pulse and Posterior Tibial pulse are 2/4 bilateral. Capillary fill time is immediate to all digits.  Neurological: Grossly intact to light touch with an achilles reflex of +2/5 and a negative Tinel's sign bilateral.  Musculoskeletal: Tenderness to palpation at the lateral calcaneal tubercale and through the insertion of the  plantar fascia on the left and medial tubercale on right,There is also pain along the peroneal tendon course at the level of the ankle on the left with worse pain today at plantar medial heel on left. No pain with compression of calcaneus bilateral. No pain with tuning fork to calcaneus bilateral. No pain with calf compression bilateral. There is decreased Ankle joint range of motion bilateral. All other joints range of motion within normal limits bilateral. Strength 5/5 in all groups bilateral.   Assessment and Plan: Problem List Items Addressed This Visit    None    Visit Diagnoses    Plantar fasciitis    -  Primary   Relevant Medications   triamcinolone acetonide (KENALOG-40) injection 20 mg (Start on 01/09/2018  3:15 PM)   Peroneal tendonitis of left lower leg       Left foot pain       Relevant Medications   methylPREDNISolone (MEDROL DOSEPAK) 4 MG TBPK tablet   triamcinolone acetonide (KENALOG-40) injection 20 mg (Start on 01/09/2018  3:15 PM)   Right foot pain       Plantar fasciitis, left       Relevant Medications   methylPREDNISolone (MEDROL DOSEPAK) 4 MG TBPK tablet     -Complete examination performed.  -Previous Xrays reviewed -Discussed with patient in detail the condition of plantar fasciitis with tendinitis and recurrent ankle pain, how this occurs and general treatment options. Explained both conservative and surgical treatments.  -After oral consent and aseptic prep, injected a mixture containing 1 ml of 2% plain lidocaine, 1 ml 0.5% plain marcaine, 0.5 ml of kenalog 10 and 0.5 ml of dexamethasone phosphate into left heel at medial and central aspect of the plantar fascia. Post-injection care discussed with patient. -Dispensed replacement fascial braces bilateral. -Recommended continue with good supportive shoes  -Will check orthotics benefits and consider custom insoles -Continue with stretching, icing, Night splint  -Refilled medrol dosepak -Patient to return to office in  3-4 weeks for follow up or sooner if problems or questions arise.  Landis Martins, DPM

## 2018-01-11 ENCOUNTER — Inpatient Hospital Stay: Payer: BLUE CROSS/BLUE SHIELD | Attending: Oncology

## 2018-01-11 VITALS — BP 109/61 | HR 65 | Temp 98.5°F | Resp 17

## 2018-01-11 DIAGNOSIS — D500 Iron deficiency anemia secondary to blood loss (chronic): Secondary | ICD-10-CM | POA: Insufficient documentation

## 2018-01-11 DIAGNOSIS — N920 Excessive and frequent menstruation with regular cycle: Secondary | ICD-10-CM | POA: Diagnosis present

## 2018-01-11 MED ORDER — SODIUM CHLORIDE 0.9 % IV SOLN
Freq: Once | INTRAVENOUS | Status: AC
  Administered 2018-01-11: 15:00:00 via INTRAVENOUS

## 2018-01-11 MED ORDER — SODIUM CHLORIDE 0.9 % IV SOLN
510.0000 mg | Freq: Once | INTRAVENOUS | Status: AC
  Administered 2018-01-11: 510 mg via INTRAVENOUS
  Filled 2018-01-11: qty 17

## 2018-01-11 NOTE — Patient Instructions (Signed)

## 2018-01-15 ENCOUNTER — Telehealth: Payer: Self-pay | Admitting: Sports Medicine

## 2018-01-15 DIAGNOSIS — M722 Plantar fascial fibromatosis: Secondary | ICD-10-CM

## 2018-01-15 DIAGNOSIS — M79672 Pain in left foot: Secondary | ICD-10-CM

## 2018-01-15 NOTE — Telephone Encounter (Signed)
I saw Dr. Cannon Kettle last week and she prescribed me prednisone. She told me to call back in if my foot were still hurting because she said I may need two rounds of it. I'm calling back in because I didn't think it would be refilled if my pharmacy just sent it through without you knowing why. If you will please give me a call back if you have any questions at [phone]. Thank you.

## 2018-01-15 NOTE — Telephone Encounter (Signed)
Dr. Cannon Kettle please advise I did not see anything in your notes.

## 2018-01-15 NOTE — Telephone Encounter (Signed)
Yes you can refill the medrol dose pack for me  Thanks Dr. Chauncey Cruel

## 2018-01-16 ENCOUNTER — Other Ambulatory Visit: Payer: Self-pay | Admitting: *Deleted

## 2018-01-16 MED ORDER — METHYLPREDNISOLONE 4 MG PO TBPK: ORAL_TABLET | ORAL | 0 refills | Status: DC

## 2018-01-16 NOTE — Addendum Note (Signed)
Addended by: Johnnye Lana A on: 01/16/2018 11:17 AM   Modules accepted: Orders

## 2018-01-29 ENCOUNTER — Ambulatory Visit: Payer: BLUE CROSS/BLUE SHIELD | Admitting: Oncology

## 2018-02-06 ENCOUNTER — Encounter: Payer: Self-pay | Admitting: Sports Medicine

## 2018-02-06 ENCOUNTER — Ambulatory Visit: Payer: BLUE CROSS/BLUE SHIELD | Admitting: Sports Medicine

## 2018-02-06 DIAGNOSIS — M722 Plantar fascial fibromatosis: Secondary | ICD-10-CM | POA: Diagnosis not present

## 2018-02-06 DIAGNOSIS — M79671 Pain in right foot: Secondary | ICD-10-CM

## 2018-02-06 DIAGNOSIS — M79672 Pain in left foot: Secondary | ICD-10-CM

## 2018-02-06 MED ORDER — TRIAMCINOLONE ACETONIDE 40 MG/ML IJ SUSP: 20.0000 mg | Freq: Once | INTRAMUSCULAR | Status: DC

## 2018-02-06 MED ORDER — DICLOFENAC SODIUM 75 MG PO TBEC: 75.0000 mg | DELAYED_RELEASE_TABLET | Freq: Two times a day (BID) | ORAL | 0 refills | Status: DC

## 2018-02-06 NOTE — Progress Notes (Signed)
Subjective: Robin Key is a 32 y.o. female patient who returns to office with complaint of heel pain on the left>right; states that pain is still there. Was better for 3 days after getting a refill on the steroid does pack. Reports that she leaves for [place] in a few days.  Reports that she has been compliant with stretching icing good supportive shoes and has ordered a new pair that she is awaiting to arrive. Denies any other pedal complaints.   Patient Active Problem List   Diagnosis Date Noted  . Cholecystitis, chronic 10/18/2015  . Cholecystitis 10/15/2015  . Leg pain 10/15/2014  . Factor V Leiden, prothrombin gene mutation ([place]) 10/15/2014  . Varicose veins of lower extremities with other complications 71/69/6789  . Pain and swelling of lower extremity 10/08/2013  . Anemia 09/08/2013  . Personal history of venous thrombosis and embolism 09/08/2013  . Cesarean delivery delivered 07/01/2013    Current Outpatient Medications on File Prior to Visit  Medication Sig Dispense Refill  . butalbital-acetaminophen-caffeine (FIORICET, ESGIC) 50-325-40 MG per tablet Take 2 tablets by mouth every 6 (six) hours as needed for headache. (Patient not taking: Reported on 02/06/2018) 30 tablet 1  . cetirizine (ZYRTEC) 10 MG tablet Take 10 mg by mouth daily.    . diclofenac sodium (VOLTAREN) 1 % GEL Apply 4 g topically 4 (four) times daily. (Patient not taking: Reported on 02/06/2018) 100 g 2  . fluticasone (FLONASE) 50 MCG/ACT nasal spray Place 2 sprays into both nostrils daily.    Marland Kitchen letrozole (FEMARA) 2.5 MG tablet Take 5 mg by mouth.    . medroxyPROGESTERone (PROVERA) 10 MG tablet TK 1 T PO  D  0  . medroxyPROGESTERone (PROVERA) 10 MG tablet TK 1 T PO  D    . meloxicam (MOBIC) 15 MG tablet Take 1 tablet (15 mg total) by mouth daily. (Patient not taking: Reported on 02/06/2018) 30 tablet 0  . methylPREDNISolone (MEDROL DOSEPAK) 4 MG TBPK tablet Take as instructed (Patient not taking: Reported on  02/06/2018) 21 tablet 0  . nitrofurantoin, macrocrystal-monohydrate, (MACROBID) 100 MG capsule   0  . NONFORMULARY OR COMPOUNDED ITEM [facility]: Antiinflammatory Cream (Patient not taking: Reported on 02/06/2018) 120 each 2  . ondansetron (ZOFRAN-ODT) 8 MG disintegrating tablet Take 1 tablet by mouth 3 (three) times daily as needed.  0  . pantoprazole (PROTONIX) 40 MG tablet   1  . promethazine (PHENERGAN) 25 MG tablet Take 25 mg by mouth every 6 (six) hours as needed. for nausea  0  . rivaroxaban (XARELTO) 20 MG TABS tablet Take 1 tablet (20 mg total) by mouth daily with supper. (Patient not taking: Reported on 02/06/2018) 30 tablet 3  . thyroid (ARMOUR THYROID) 60 MG tablet Take 60 mg by mouth.     Current Facility-Administered Medications on File Prior to Visit  Medication Dose Route Frequency Provider Last Rate Last Dose  . triamcinolone acetonide (KENALOG) 10 MG/ML injection 10 mg  10 mg Other Once [place], Laelani Vasko, DPM      . triamcinolone acetonide (KENALOG-40) injection 20 mg  20 mg Other Once [place], Tiarrah [place], DPM      . triamcinolone acetonide (KENALOG-40) injection 20 mg  20 mg Other Once [place], Geral Coker, DPM      . triamcinolone acetonide (KENALOG-40) injection 20 mg  20 mg Other Once Landis Martins, DPM        Allergies  Allergen Reactions  . Ciprofloxacin Other (See Comments)    Felt like "bugs crawling"  when on Lovenox  . Shellfish-Derived Products Swelling    Shrimp Shrimp  . Bactrim [Sulfamethoxazole-Trimethoprim] Other (See Comments)    headache Headache   . Oxycodone Nausea And Vomiting  . Percocet [Oxycodone-Acetaminophen] Nausea And Vomiting  . Amoxicillin Rash  . Latex Rash  . Oxycodone-Acetaminophen Nausea And Vomiting  . Penicillins Rash  . Sulfa Antibiotics Rash    Objective: Physical Exam General: The patient is alert and oriented x3 in no acute distress.  Dermatology: Skin is warm, dry and supple bilateral lower extremities. Nails 1-10 are normal.  There is no erythema, + focal edema bilateral ankle, no eccymosis, no open lesions present. Integument is otherwise unremarkable.  Vascular: Dorsalis Pedis pulse and Posterior Tibial pulse are 2/4 bilateral. Capillary fill time is immediate to all digits.  Neurological: Grossly intact to light touch with an achilles reflex of +2/5 and a negative Tinel's sign bilateral.  Musculoskeletal: Tenderness to palpation at the lateral calcaneal tubercale and through the insertion of the plantar fascia on the left and medial tubercale on right.  There is mild tenderness along the peroneal tendon course however the plantar lateral heel pain on the left is the worst today and the right medial heel is also worse today.  No pain with compression of calcaneus bilateral. No pain with tuning fork to calcaneus bilateral. No pain with calf compression bilateral. There is decreased Ankle joint range of motion bilateral. All other joints range of motion within normal limits bilateral. Strength 5/5 in all groups bilateral.   Assessment and Plan: Problem List Items Addressed This Visit    None    Visit Diagnoses    Plantar fasciitis, left    -  Primary   Relevant Medications   diclofenac (VOLTAREN) 75 MG EC tablet   triamcinolone acetonide (KENALOG-40) injection 20 mg (Start on 02/06/2018  6:00 PM)   Plantar fasciitis, right       Relevant Medications   diclofenac (VOLTAREN) 75 MG EC tablet   triamcinolone acetonide (KENALOG-40) injection 20 mg (Start on 02/06/2018  6:00 PM)   Left foot pain       Right foot pain         -Complete examination performed.  -Previous Xrays reviewed -Re-Discussed with patient in detail the condition of plantar fasciitis with tendinitis and recurrent pain, how this occurs and general treatment options. Explained both conservative and surgical treatments.  -After oral consent and aseptic prep, injected a mixture containing 1 ml of 2% plain lidocaine, 1 ml 0.5% plain marcaine, 0.5 ml of  kenalog 40 and 0.5 ml of dexamethasone phosphate into right heel at medial aspect of the plantar fascia and at the left heel at the lateral margin of the plantar fascia. Post-injection care discussed with patient. -Continue with fascial braces bilateral. -Recommended continue with good supportive shoes  -Continue with stretching, icing, Night splint  -Patient to try diclofenac once she has been able to come off her blood thinner -Patient to return to office in 3-4 weeks/after returns from [facility] for follow up or sooner if problems or questions arise.  Landis Martins, DPM

## 2018-02-27 ENCOUNTER — Ambulatory Visit: Payer: BLUE CROSS/BLUE SHIELD | Admitting: Sports Medicine

## 2018-03-04 ENCOUNTER — Other Ambulatory Visit: Payer: Self-pay | Admitting: Radiology

## 2018-03-04 DIAGNOSIS — N6320 Unspecified lump in the left breast, unspecified quadrant: Secondary | ICD-10-CM

## 2018-03-08 ENCOUNTER — Ambulatory Visit
Admission: RE | Admit: 2018-03-08 | Discharge: 2018-03-08 | Disposition: A | Discharge status: Home / Self Care | Payer: BLUE CROSS/BLUE SHIELD | Source: Ambulatory Visit | Attending: Radiology | Admitting: Radiology

## 2018-03-08 ENCOUNTER — Other Ambulatory Visit: Payer: Self-pay | Admitting: Radiology

## 2018-03-08 DIAGNOSIS — N6320 Unspecified lump in the left breast, unspecified quadrant: Secondary | ICD-10-CM

## 2018-03-08 DIAGNOSIS — N6310 Unspecified lump in the right breast, unspecified quadrant: Secondary | ICD-10-CM

## 2018-03-11 ENCOUNTER — Other Ambulatory Visit: Payer: Self-pay | Admitting: Pediatrics

## 2018-03-11 DIAGNOSIS — N6310 Unspecified lump in the right breast, unspecified quadrant: Secondary | ICD-10-CM

## 2018-03-13 ENCOUNTER — Ambulatory Visit
Admission: RE | Admit: 2018-03-13 | Discharge: 2018-03-13 | Disposition: A | Discharge status: Home / Self Care | Payer: BLUE CROSS/BLUE SHIELD | Source: Ambulatory Visit | Attending: Pediatrics | Admitting: Pediatrics

## 2018-03-13 DIAGNOSIS — N6310 Unspecified lump in the right breast, unspecified quadrant: Secondary | ICD-10-CM

## 2018-04-05 ENCOUNTER — Inpatient Hospital Stay (HOSPITAL_BASED_OUTPATIENT_CLINIC_OR_DEPARTMENT_OTHER): Payer: BLUE CROSS/BLUE SHIELD | Admitting: Oncology

## 2018-04-05 ENCOUNTER — Telehealth: Payer: Self-pay

## 2018-04-05 ENCOUNTER — Inpatient Hospital Stay: Payer: BLUE CROSS/BLUE SHIELD | Attending: Oncology

## 2018-04-05 VITALS — BP 140/82 | HR 85 | Temp 98.6°F | Resp 18 | Ht 66.0 in | Wt 271.2 lb

## 2018-04-05 DIAGNOSIS — Z79899 Other long term (current) drug therapy: Secondary | ICD-10-CM

## 2018-04-05 DIAGNOSIS — Z7901 Long term (current) use of anticoagulants: Secondary | ICD-10-CM

## 2018-04-05 DIAGNOSIS — Z86711 Personal history of pulmonary embolism: Secondary | ICD-10-CM

## 2018-04-05 DIAGNOSIS — D6851 Activated protein C resistance: Secondary | ICD-10-CM

## 2018-04-05 DIAGNOSIS — N920 Excessive and frequent menstruation with regular cycle: Secondary | ICD-10-CM

## 2018-04-05 DIAGNOSIS — D500 Iron deficiency anemia secondary to blood loss (chronic): Secondary | ICD-10-CM

## 2018-04-05 LAB — CBC WITH DIFFERENTIAL (CANCER CENTER ONLY)
Basophils Absolute: 0.1 10*3/uL (ref 0.0–0.1)
Basophils Relative: 1 %
Eosinophils Absolute: 0.1 10*3/uL (ref 0.0–0.5)
Eosinophils Relative: 0 %
HEMATOCRIT: 41.7 % (ref 34.8–46.6)
HEMOGLOBIN: 13.7 g/dL (ref 11.6–15.9)
LYMPHS ABS: 3.4 10*3/uL — AB (ref 0.9–3.3)
LYMPHS PCT: 21 %
MCH: 28.8 pg (ref 25.1–34.0)
MCHC: 32.8 g/dL (ref 31.5–36.0)
MCV: 87.8 fL (ref 79.5–101.0)
MONO ABS: 0.9 10*3/uL (ref 0.1–0.9)
MONOS PCT: 5 %
NEUTROS ABS: 11.4 10*3/uL — AB (ref 1.5–6.5)
NEUTROS PCT: 73 %
Platelet Count: 284 10*3/uL (ref 145–400)
RBC: 4.75 MIL/uL (ref 3.70–5.45)
RDW: 13.2 % (ref 11.2–14.5)
WBC Count: 15.8 10*3/uL — ABNORMAL HIGH (ref 3.9–10.3)

## 2018-04-05 NOTE — Telephone Encounter (Signed)
Printed avs and calender of upcoming appointment. Per 8/30 los 

## 2018-04-05 NOTE — Progress Notes (Signed)
Hematology and Oncology Follow Up Visit  Robin Key [id number] 03/04/1986 32 y.o. 04/05/2018 2:59 PM Robin Key, Robin Key, [name], Robin Melena, NP   Principle Diagnosis: 32 year old woman with:  1.  Factor V Leiden mutation, heterozygous diagnosed in 2012 in the setting of pulmonary embolism.   2. Iron deficiency anemia diagnosed in 2012.  This is related to menstrual blood losses and menorrhagia.  Prior Therapy:  Full dose anticoagulation with Lovenox after she had a C-section on 07/01/2013.   She is status post IV iron in the form of Feraheme as needed.  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 She was switched to Lovenox in June 2018.  Therapy discontinued in March 2019.  Current therapy:   Xarelto 20 mg daily started in June 2019.  IV iron intermittently for iron deficiency anemia due to menorrhagia.  Interim History:  Robin Key presents today for a routine follow-up.  Since her last visit, she continues to take Xarelto on a regular basis without any complications.  She denies any menorrhagia or hematochezia or any excessive bleeding.  Her menstrual cycles have been regular for the time being.  She was diagnosed with migraine headaches and was started on steroids which she has taken for a week.  Her performance status and activity level remain excellent.  No recent hospitalizations or illnesses.  She does not report any headaches, blurry vision or syncope.  He denies any dizziness or confusion.  She does not report any fevers, chills, sweats or weight loss. She does not report any chest pain, palpitation or orthopnea.  She does not report any cough, wheezing or hemoptysis. She does not report any nausea or vomiting or abdominal pain.  She denies any early satiety or change in her bowel habits.  She does not report any frequency urgency or hesitancy.  She does not report any hematuria or dysuria.  He does not report any  lymphadenopathy or petechiae.  She denies any bleeding or clotting tendencies.  She denies any heat or cold intolerance.  Remainder of her review of systems is negative.   Medications: I have reviewed the patient's current medications.  Current Outpatient Medications  Medication Sig Dispense Refill  . butalbital-acetaminophen-caffeine (FIORICET, ESGIC) 50-325-40 MG per tablet Take 2 tablets by mouth every 6 (six) hours as needed for headache. (Patient not taking: Reported on 02/06/2018) 30 tablet 1  . cetirizine (ZYRTEC) 10 MG tablet Take 10 mg by mouth daily.    . diclofenac (VOLTAREN) 75 MG EC tablet Take 1 tablet (75 mg total) by mouth 2 (two) times daily. 30 tablet 0  . diclofenac sodium (VOLTAREN) 1 % GEL Apply 4 g topically 4 (four) times daily. (Patient not taking: Reported on 02/06/2018) 100 g 2  . fluticasone (FLONASE) 50 MCG/ACT nasal spray Place 2 sprays into both nostrils daily.    Marland Kitchen letrozole (FEMARA) 2.5 MG tablet Take 5 mg by mouth.    . medroxyPROGESTERone (PROVERA) 10 MG tablet TK 1 T PO  D  0  . medroxyPROGESTERone (PROVERA) 10 MG tablet TK 1 T PO  D    . meloxicam (MOBIC) 15 MG tablet Take 1 tablet (15 mg total) by mouth daily. (Patient not taking: Reported on 02/06/2018) 30 tablet 0  . methylPREDNISolone (MEDROL DOSEPAK) 4 MG TBPK tablet Take as instructed (Patient not taking: Reported on 02/06/2018) 21 tablet 0  . nitrofurantoin, macrocrystal-monohydrate, (MACROBID) 100 MG capsule   0  . NONFORMULARY OR COMPOUNDED ITEM  [facility]: Antiinflammatory Cream (Patient not taking: Reported on 02/06/2018) 120 each 2  . ondansetron (ZOFRAN-ODT) 8 MG disintegrating tablet Take 1 tablet by mouth 3 (three) times daily as needed.  0  . pantoprazole (PROTONIX) 40 MG tablet   1  . promethazine (PHENERGAN) 25 MG tablet Take 25 mg by mouth every 6 (six) hours as needed. for nausea  0  . rivaroxaban (XARELTO) 20 MG TABS tablet Take 1 tablet (20 mg total) by mouth daily with supper. (Patient not  taking: Reported on 02/06/2018) 30 tablet 3  . thyroid (ARMOUR THYROID) 60 MG tablet Take 60 mg by mouth.     Current Facility-Administered Medications  Medication Dose Route Frequency Provider Last Rate Last Dose  . triamcinolone acetonide (KENALOG) 10 MG/ML injection 10 mg  10 mg Other Once [place], [place], DPM      . triamcinolone acetonide (KENALOG-40) injection 20 mg  20 mg Other Once [place], [place], DPM      . triamcinolone acetonide (KENALOG-40) injection 20 mg  20 mg Other Once [place], [place], DPM      . triamcinolone acetonide (KENALOG-40) injection 20 mg  20 mg Other Once [place], [place], DPM      . triamcinolone acetonide (KENALOG-40) injection 20 mg  20 mg Other Once Landis Martins, DPM         Allergies:  Allergies  Allergen Reactions  . Ciprofloxacin Other (See Comments)    Felt like "bugs crawling" when on Lovenox  . Shellfish-Derived Products Swelling    Shrimp Shrimp  . Bactrim [Sulfamethoxazole-Trimethoprim] Other (See Comments)    headache Headache   . Oxycodone Nausea And Vomiting  . Percocet [Oxycodone-Acetaminophen] Nausea And Vomiting  . Amoxicillin Rash  . Latex Rash  . Oxycodone-Acetaminophen Nausea And Vomiting  . Penicillins Rash  . Sulfa Antibiotics Rash    Past Medical History, Surgical history, Social history, and Family History were reviewed and updated.  Physical Exam:   ECOG: 0   General appearance: Comfortable appearing without any discomfort Head: Normocephalic without any trauma Oropharynx: Mucous membranes are moist and pink without any thrush or ulcers. Eyes: Pupils are equal and round reactive to light. Lymph nodes: No cervical, supraclavicular, inguinal or axillary lymphadenopathy.   Heart:regular rate and rhythm.  S1 and S2 mild trace edema noted. Lung: Clear without any rhonchi or wheezes.  No dullness to percussion. Abdomin: Soft, nontender, nondistended with good bowel sounds.  No hepatosplenomegaly. Musculoskeletal: No  joint deformity or effusion.  Full range of motion noted. Neurological: No deficits noted on motor, sensory and deep tendon reflex exam. Skin: No petechial rash or dryness.  Appeared moist.  Psychiatric: Mood and affect appeared appropriate.    Lab Results: Lab Results  Component Value Date   WBC 7.7 01/02/2018   HGB 12.8 01/02/2018   HCT 38.1 01/02/2018   MCV 88.6 01/02/2018   PLT 259 01/02/2018     Chemistry      Component Value Date/Time   NA 139 08/27/2015 1411   K 3.9 08/27/2015 1411   CL 99 07/01/2013 1115   CO2 28 08/27/2015 1411   BUN 11.5 08/27/2015 1411   CREATININE 0.9 08/27/2015 1411      Component Value Date/Time   CALCIUM 9.7 08/27/2015 1411   ALKPHOS 100 08/27/2015 1411   AST 17 08/27/2015 1411   ALT 18 08/27/2015 1411   BILITOT 0.79 08/27/2015 1411     Iron/TIBC/Ferritin/ %Sat    Component Value Date/Time   IRON 35 (L) 01/02/2018 1454  IRON 72 03/14/2017 1510   TIBC 249 01/02/2018 1454   TIBC 263 03/14/2017 1510   FERRITIN 149 01/02/2018 1454   FERRITIN 321 (H) 03/14/2017 1510   IRONPCTSAT 14 (L) 01/02/2018 1454   IRONPCTSAT 28 03/14/2017 1510    Impression and Plan:  32 year old woman with:  1.  Factor V Leiden heterozygous mutation diagnosed in 2012 after presenting with recurrent thrombosis and phlebitis.  She has been on recurrent long-term anticoagulation in the past.  She resumed the Xarelto 20 mg daily after brief treatment with Lovenox as she was desiring pregnancy.  Risks and benefits of long-term anticoagulation was revisited today and she is agreeable to continue.  Given her high risk of thrombosis she understands risk of thrombosis might outweigh the risk of bleeding.    2.  Iron deficiency anemia: Related to menorrhagia from chronic anticoagulation.  Her iron studies are repeated today and currently pending.  Iron studies from Jan 02, 2018 showed mild deficiency that resulted in IV iron infusion on 01/11/2018.  Her hemoglobin is  normal at this time and will replace RVR and is needed.  3. Irregular menstrual cycles: They are more regular at this time.  She continues to follow with gynecology regarding this issue and uses Provera intermittently.  4.  Leukocytosis: Related to recent steroid use and mostly reactive.  5. Follow-up: Will be in 3 months.  15 minutes was spent with the patient face-to-face today.  More than 50% of time was dedicated to discussing the natural course of her disease, treatment options and long-term complication associated with this therapy.   Zola Button, MD 04/05/18

## 2018-04-09 LAB — FERRITIN: FERRITIN: 156 ng/mL (ref 11–307)

## 2018-04-09 LAB — IRON AND TIBC
Iron: 48 ug/dL (ref 41–142)
Saturation Ratios: 18 % — ABNORMAL LOW (ref 21–57)
TIBC: 268 ug/dL (ref 236–444)
UIBC: 219 ug/dL

## 2018-04-10 ENCOUNTER — Telehealth: Payer: Self-pay

## 2018-04-10 NOTE — Telephone Encounter (Signed)
Contacted patient and made aware that IV iron level is normal and there is no need for IV iron at this time per Dr. Alen Blew. Patient appreciative for the update.

## 2018-04-29 ENCOUNTER — Other Ambulatory Visit: Payer: Self-pay | Admitting: Oncology

## 2018-05-03 ENCOUNTER — Other Ambulatory Visit: Payer: Self-pay | Admitting: Sports Medicine

## 2018-05-03 DIAGNOSIS — M722 Plantar fascial fibromatosis: Secondary | ICD-10-CM

## 2018-05-09 ENCOUNTER — Telehealth: Payer: Self-pay | Admitting: *Deleted

## 2018-05-09 NOTE — Telephone Encounter (Signed)
Medical records faxed to [facility]; release [id number]

## 2018-07-09 ENCOUNTER — Inpatient Hospital Stay: Payer: BLUE CROSS/BLUE SHIELD | Attending: Oncology

## 2018-07-09 ENCOUNTER — Inpatient Hospital Stay (HOSPITAL_BASED_OUTPATIENT_CLINIC_OR_DEPARTMENT_OTHER): Payer: BLUE CROSS/BLUE SHIELD | Admitting: Oncology

## 2018-07-09 ENCOUNTER — Telehealth: Payer: Self-pay

## 2018-07-09 VITALS — BP 113/83 | HR 88 | Temp 98.8°F | Resp 18 | Ht 66.0 in | Wt 272.2 lb

## 2018-07-09 DIAGNOSIS — D6851 Activated protein C resistance: Secondary | ICD-10-CM | POA: Insufficient documentation

## 2018-07-09 DIAGNOSIS — Z79899 Other long term (current) drug therapy: Secondary | ICD-10-CM

## 2018-07-09 DIAGNOSIS — Z7901 Long term (current) use of anticoagulants: Secondary | ICD-10-CM

## 2018-07-09 DIAGNOSIS — N920 Excessive and frequent menstruation with regular cycle: Secondary | ICD-10-CM | POA: Diagnosis not present

## 2018-07-09 DIAGNOSIS — D500 Iron deficiency anemia secondary to blood loss (chronic): Secondary | ICD-10-CM

## 2018-07-09 DIAGNOSIS — Z86711 Personal history of pulmonary embolism: Secondary | ICD-10-CM | POA: Insufficient documentation

## 2018-07-09 DIAGNOSIS — Z793 Long term (current) use of hormonal contraceptives: Secondary | ICD-10-CM | POA: Insufficient documentation

## 2018-07-09 LAB — CBC WITH DIFFERENTIAL (CANCER CENTER ONLY)
ABS IMMATURE GRANULOCYTES: 0.04 10*3/uL (ref 0.00–0.07)
BASOS PCT: 1 %
Basophils Absolute: 0 10*3/uL (ref 0.0–0.1)
Eosinophils Absolute: 0.1 10*3/uL (ref 0.0–0.5)
Eosinophils Relative: 1 %
HCT: 40.7 % (ref 36.0–46.0)
Hemoglobin: 13.3 g/dL (ref 12.0–15.0)
IMMATURE GRANULOCYTES: 1 %
Lymphocytes Relative: 25 %
Lymphs Abs: 2.1 10*3/uL (ref 0.7–4.0)
MCH: 29 pg (ref 26.0–34.0)
MCHC: 32.7 g/dL (ref 30.0–36.0)
MCV: 88.9 fL (ref 80.0–100.0)
MONO ABS: 0.5 10*3/uL (ref 0.1–1.0)
Monocytes Relative: 6 %
NEUTROS ABS: 5.7 10*3/uL (ref 1.7–7.7)
NEUTROS PCT: 66 %
Platelet Count: 281 10*3/uL (ref 150–400)
RBC: 4.58 MIL/uL (ref 3.87–5.11)
RDW: 12.5 % (ref 11.5–15.5)
WBC Count: 8.4 10*3/uL (ref 4.0–10.5)
nRBC: 0 % (ref 0.0–0.2)

## 2018-07-09 LAB — HCG, SERUM, QUALITATIVE: Preg, Serum: NEGATIVE

## 2018-07-09 NOTE — Progress Notes (Signed)
Hematology and Oncology Follow Up Visit  Robin Key [id number] 30-Oct-1985 32 y.o. 07/09/2018 3:23 PM Robin Key, [name], [name], NP   Principle Diagnosis: 32 year old woman with:  1. Heterozygous factor V Leiden mutation after presenting with pulmonary embolism in 2012.  2. Iron deficiency anemia related to chronic menstrual blood losses.    Prior Therapy:  Full dose anticoagulation with Lovenox after she had a C-section on 07/01/2013.   She is status post IV iron in the form of Feraheme as needed.  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 She was switched to Lovenox in June 2018.  Therapy discontinued in March 2019.  Current therapy:   Xarelto 20 mg daily started in June 2019.  IV iron intermittently for iron deficiency anemia due to menorrhagia.  Interim History:  Robin Key returns today for a repeat evaluation.  Since her last visit, she reports feeling reasonably fair without any new changes.  She does report slight increased fatigue and joint pain but otherwise continues to work full-time and work rather heavily.  She has not had any menstrual cycles and received Depo-Provera 6 months prior.  She remains on Xarelto without any bleeding complications.  She denies any difficulty breathing or dyspnea on exertion.  She does not report any headaches, blurry vision or syncope.  He denies any alteration in mental status or lethargy.  She does not report any fevers, chills, sweats or weight loss. She does not report any chest pain, palpitation or orthopnea.  She does not report any cough, wheezing or hemoptysis. She does not report any nausea or vomiting or abdominal pain.  She denies any constipation or diarrhea.  She does not report any frequency urgency or hesitancy.  She does not report any hematuria or dysuria.  He does not report any skin rashes or lesions.  She denies any ecchymosis or petechiae.  She denies any  changes in her mood.  Remainder of her review of systems is negative.   Medications: I have reviewed the patient's current medications.  Current Outpatient Medications  Medication Sig Dispense Refill  . butalbital-acetaminophen-caffeine (FIORICET, ESGIC) 50-325-40 MG per tablet Take 2 tablets by mouth every 6 (six) hours as needed for headache. (Patient not taking: Reported on 02/06/2018) 30 tablet 1  . cetirizine (ZYRTEC) 10 MG tablet Take 10 mg by mouth daily.    . diclofenac (VOLTAREN) 75 MG EC tablet TAKE 1 TABLET(75 MG) BY MOUTH TWICE DAILY 30 tablet 0  . diclofenac sodium (VOLTAREN) 1 % GEL Apply 4 g topically 4 (four) times daily. (Patient not taking: Reported on 02/06/2018) 100 g 2  . fluticasone (FLONASE) 50 MCG/ACT nasal spray Place 2 sprays into both nostrils daily.    Marland Kitchen letrozole (FEMARA) 2.5 MG tablet Take 5 mg by mouth.    . medroxyPROGESTERone (PROVERA) 10 MG tablet TK 1 T PO  D  0  . medroxyPROGESTERone (PROVERA) 10 MG tablet TK 1 T PO  D    . meloxicam (MOBIC) 15 MG tablet Take 1 tablet (15 mg total) by mouth daily. (Patient not taking: Reported on 02/06/2018) 30 tablet 0  . methylPREDNISolone (MEDROL DOSEPAK) 4 MG TBPK tablet Take as instructed (Patient not taking: Reported on 02/06/2018) 21 tablet 0  . nitrofurantoin, macrocrystal-monohydrate, (MACROBID) 100 MG capsule   0  . NONFORMULARY OR COMPOUNDED [place]: Antiinflammatory Cream (Patient not taking: Reported on 02/06/2018) 120 each 2  . ondansetron (ZOFRAN-ODT) 8 MG disintegrating tablet  Take 1 tablet by mouth 3 (three) times daily as needed.  0  . pantoprazole (PROTONIX) 40 MG tablet   1  . promethazine (PHENERGAN) 25 MG tablet Take 25 mg by mouth every 6 (six) hours as needed. for nausea  0  . thyroid (ARMOUR THYROID) 60 MG tablet Take 60 mg by mouth.    Alveda Reasons 20 MG TABS tablet TAKE 1 TABLET(20 MG) BY MOUTH DAILY WITH SUPPER 30 tablet 0   Current Facility-Administered Medications  Medication Dose Route  Frequency Provider Last Rate Last Dose  . triamcinolone acetonide (KENALOG) 10 MG/ML injection 10 mg  10 mg Other Once [place], Titorya, DPM      . triamcinolone acetonide (KENALOG-40) injection 20 mg  20 mg Other Once [place], Titorya, DPM      . triamcinolone acetonide (KENALOG-40) injection 20 mg  20 mg Other Once [place], Titorya, DPM      . triamcinolone acetonide (KENALOG-40) injection 20 mg  20 mg Other Once [place], Titorya, DPM      . triamcinolone acetonide (KENALOG-40) injection 20 mg  20 mg Other Once Land[place] Martins, DPM         Allergies:  Allergies  Allergen Reactions  . Ciprofloxacin Other (See Comments)    Felt like "bugs crawling" when on Lovenox  . Shellfish-Derived Products Swelling    Shrimp Shrimp  . Bactrim [Sulfamethoxazole-Trimethoprim] Other (See Comments)    headache Headache   . Oxycodone Nausea And Vomiting  . Percocet [Oxycodone-Acetaminophen] Nausea And Vomiting  . Amoxicillin Rash  . Latex Rash  . Oxycodone-Acetaminophen Nausea And Vomiting  . Penicillins Rash  . Sulfa Antibiotics Rash    Past Medical History, Surgical history, Social history, and Family History were reviewed and updated.  Physical Exam: Blood pressure 113/83, pulse 88, temperature 98.8 F (37.1 C), temperature source Oral, resp. rate 18, height 5\' 6"  (1.676 m), weight 272 lb 3.2 oz (123.5 kg), SpO2 100 %, unknown if currently breastfeeding.    ECOG: 0   General appearance: Alert, awake without any distress. Head: Atraumatic without abnormalities Oropharynx: Without any thrush or ulcers. Eyes: No scleral icterus. Lymph nodes: No lymphadenopathy noted in the cervical, supraclavicular, or axillary nodes Heart:regular rate and rhythm, without any murmurs or gallops.   Lung: Clear to auscultation without any rhonchi, wheezes or dullness to percussion. Abdomin: Soft, nontender without any shifting dullness or ascites. Musculoskeletal: No clubbing or cyanosis. Neurological: No  motor or sensory deficits. Skin: No rashes or lesions.     Lab Results: Lab Results  Component Value Date   WBC 15.8 (H) 04/05/2018   HGB 13.7 04/05/2018   HCT 41.7 04/05/2018   MCV 87.8 04/05/2018   PLT 284 04/05/2018     Chemistry      Component Value Date/Time   NA 139 08/27/2015 1411   K 3.9 08/27/2015 1411   CL 99 07/01/2013 1115   CO2 28 08/27/2015 1411   BUN 11.5 08/27/2015 1411   CREATININE 0.9 08/27/2015 1411      Component Value Date/Time   CALCIUM 9.7 08/27/2015 1411   ALKPHOS 100 08/27/2015 1411   AST 17 08/27/2015 1411   ALT 18 08/27/2015 1411   BILITOT 0.79 08/27/2015 1411     Iron/TIBC/Ferritin/ %Sat    Component Value Date/Time   IRON 48 04/05/2018 1450   IRON 72 03/14/2017 1510   TIBC 268 04/05/2018 1450   TIBC 263 03/14/2017 1510   FERRITIN 156 04/05/2018 1450   FERRITIN 321 (H) 03/14/2017 1510  IRONPCTSAT 18 (L) 04/05/2018 1450   IRONPCTSAT 28 03/14/2017 1510    Impression and Plan:  32 year old woman with:  1.  Heterozygous factor V Leiden mutation detected after developing pulmonary embolism and recurrent phlebitis diagnosed in 2012.  She is currently on Xarelto without any recurrent thrombosis at this time.  Risks and benefits of continuing therapy and long-term complications were reiterated.  I have recommended continuing Xarelto at this time indefinitely.  I will check a pregnancy test on her today and if she is pregnant Xarelto will be switched to Lovenox.    2.  Iron deficiency anemia: She has received intravenous iron therapy due to poor tolerance for oral iron.  Her blood losses related to menstrual cycles that are heavy at times.  Iron studies obtained in August 2019 were within normal range.  Hemoglobin is normal at this time and iron studies are pending.  Risks and benefits of intravenous iron was reviewed today and she is agreeable to receive it if iron studies are low.    3. Irregular menstrual cycles: She has not had a  menstrual cycles for the last 6 months which could be related to Depo-Provera.  I will check a pregnancy test on her today.  4.  Arthralgias and fatigue: I recommended also establishing care with a primary care physician to recheck her thyroid function and routine physical examination.  5. Follow-up: Will be in 3 months.  15 minutes was spent with the patient face-to-face today.  More than 50% of time was dedicated to reviewing her disease status, laboratory data and treatment options.   Zola Button, MD 07/09/18

## 2018-07-09 NOTE — Addendum Note (Signed)
Addended by: Randolm Idol on: 07/09/2018 04:16 PM   Modules accepted: Orders

## 2018-07-09 NOTE — Telephone Encounter (Signed)
Printed avs and calender of upcoming appointment. Per 12/3 los 

## 2018-07-10 ENCOUNTER — Telehealth: Payer: Self-pay | Admitting: *Deleted

## 2018-07-10 LAB — IRON AND TIBC
Iron: 42 ug/dL (ref 41–142)
SATURATION RATIOS: 15 % — AB (ref 21–57)
TIBC: 273 ug/dL (ref 236–444)
UIBC: 231 ug/dL (ref 120–384)

## 2018-07-10 LAB — FERRITIN: Ferritin: 156 ng/mL (ref 11–307)

## 2018-07-10 NOTE — Telephone Encounter (Signed)
Unable to reach patient for lab results. No answer at one phone # and mailbox is full on other #

## 2018-07-10 NOTE — Telephone Encounter (Signed)
-----   Message from Wyatt Portela, MD sent at 07/10/2018 10:45 AM EST ----- Please let her know her iron is normal.

## 2018-07-10 NOTE — Telephone Encounter (Signed)
Voice mail not set up on phone.lm on additional # to call desk nurse for dr Alen Blew.

## 2018-07-10 NOTE — Telephone Encounter (Signed)
-----   Message from Wyatt Portela, MD sent at 07/10/2018  7:26 AM EST ----- Please let her know her pregnancy test is negative.

## 2018-08-27 IMAGING — MG MM BREAST LOCALIZATION CLIP
1 series · 1 of 1 positions shown · non-contrast
Comparison: Previous exam(s).

CLINICAL DATA: 32-year-old female status post ultrasound-guided
biopsy of a right breast mass.

EXAM:
DIAGNOSTIC RIGHT MAMMOGRAM POST ULTRASOUND BIOPSY

[R ML]
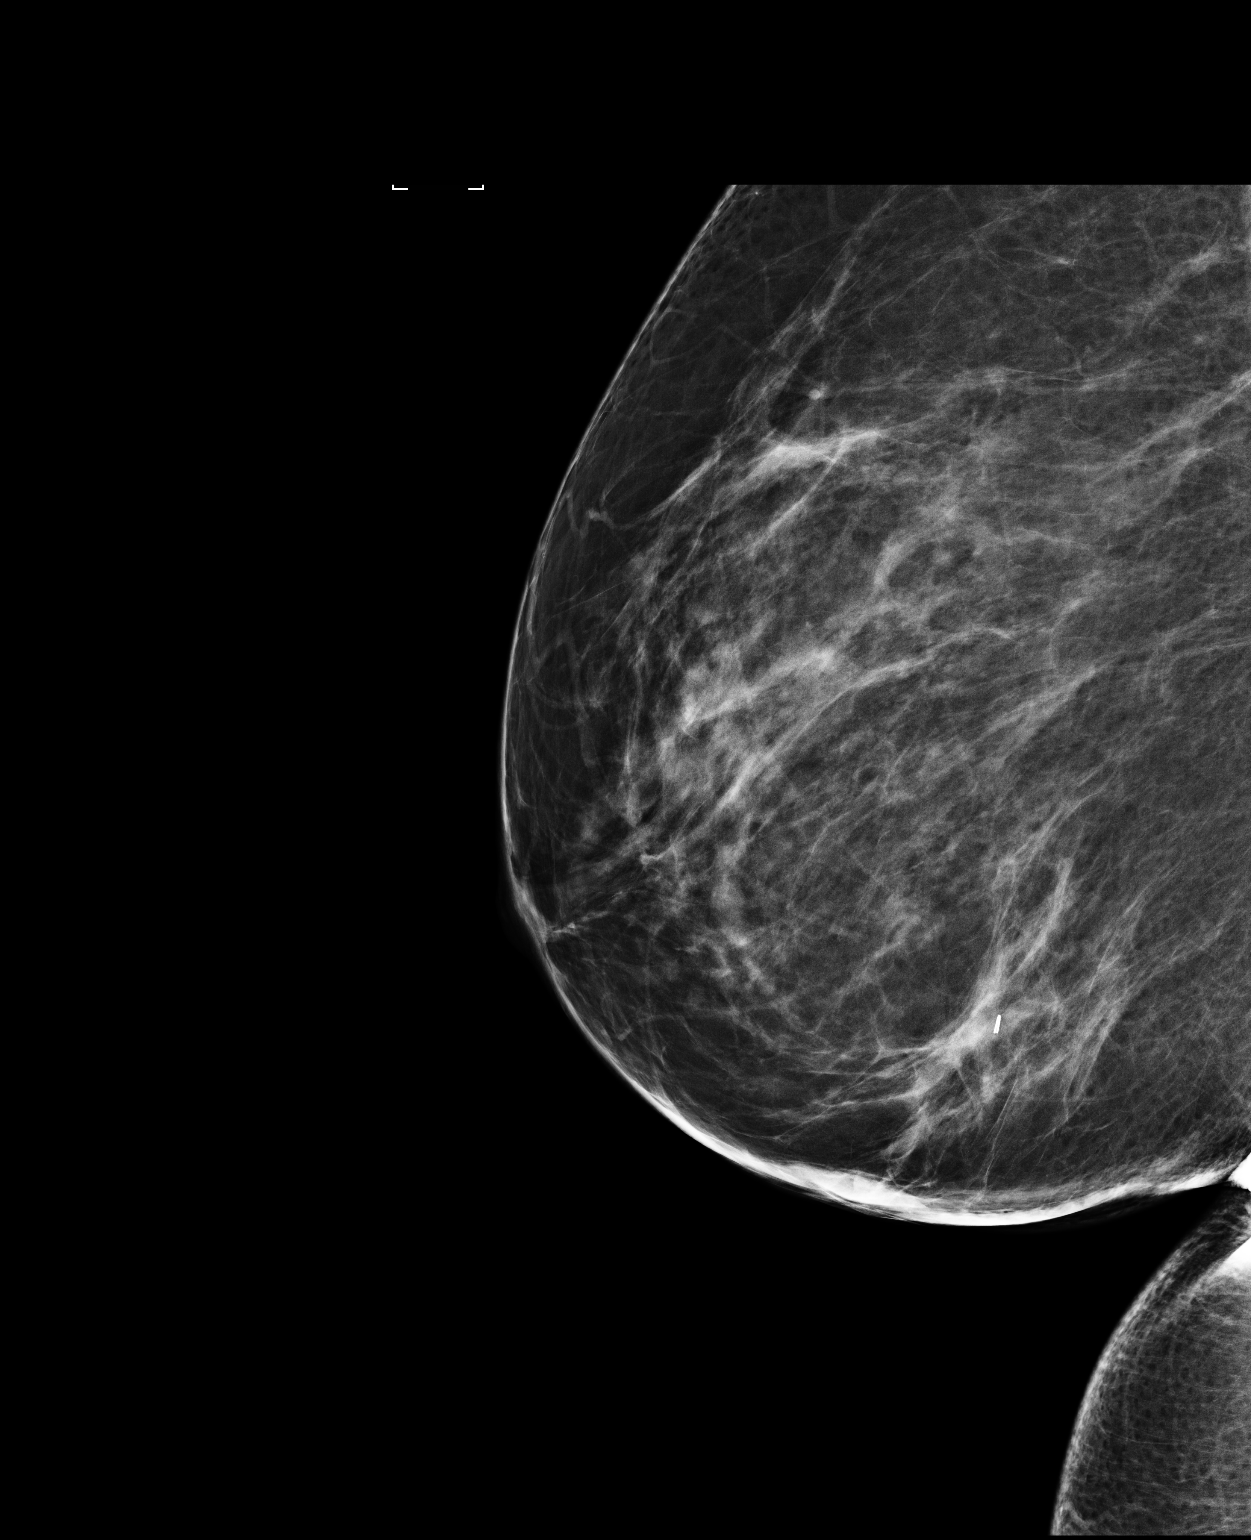

[1 of 1 positions shown; findings below may reference images not displayed]

FINDINGS: Mammographic images were obtained following ultrasound guided biopsy
of a right breast mass. A ribbon shaped clip is identified in the
lower inner quadrant of the right breast at middle depth.
IMPRESSION: Ribbon shaped clip in the expected location status post
ultrasound-guided biopsy of a right breast mass.

Final Assessment: Post Procedure Mammograms for Marker Placement

## 2018-08-30 ENCOUNTER — Other Ambulatory Visit: Payer: Self-pay | Admitting: Oncology

## 2018-09-26 ENCOUNTER — Other Ambulatory Visit: Payer: Self-pay | Admitting: Oncology

## 2018-09-30 ENCOUNTER — Telehealth: Payer: Self-pay | Admitting: *Deleted

## 2018-09-30 NOTE — Telephone Encounter (Signed)
Patient calling to say she is having bleeding gums on a daily basis and her legs are bruising mor than normal. She has an appt with dr Alen Blew next week, 10/08/18, but  She would like to come in tomorrow afternoon for a cbc, if that's okay with dr Alen Blew?

## 2018-10-01 ENCOUNTER — Telehealth: Payer: Self-pay | Admitting: *Deleted

## 2018-10-01 NOTE — Telephone Encounter (Signed)
Patient will come in 10/04/18 for CBC only. Will wait for results from nurse.

## 2018-10-01 NOTE — Telephone Encounter (Signed)
Ok to do cbc.

## 2018-10-04 ENCOUNTER — Inpatient Hospital Stay: Payer: BLUE CROSS/BLUE SHIELD | Attending: Oncology

## 2018-10-04 DIAGNOSIS — N920 Excessive and frequent menstruation with regular cycle: Secondary | ICD-10-CM | POA: Diagnosis not present

## 2018-10-04 DIAGNOSIS — D500 Iron deficiency anemia secondary to blood loss (chronic): Secondary | ICD-10-CM | POA: Insufficient documentation

## 2018-10-04 LAB — CBC WITH DIFFERENTIAL (CANCER CENTER ONLY)
ABS IMMATURE GRANULOCYTES: 0.03 10*3/uL (ref 0.00–0.07)
Basophils Absolute: 0 10*3/uL (ref 0.0–0.1)
Basophils Relative: 0 %
Eosinophils Absolute: 0.1 10*3/uL (ref 0.0–0.5)
Eosinophils Relative: 1 %
HCT: 41.1 % (ref 36.0–46.0)
Hemoglobin: 13.5 g/dL (ref 12.0–15.0)
Immature Granulocytes: 0 %
Lymphocytes Relative: 18 %
Lymphs Abs: 1.8 10*3/uL (ref 0.7–4.0)
MCH: 28.8 pg (ref 26.0–34.0)
MCHC: 32.8 g/dL (ref 30.0–36.0)
MCV: 87.6 fL (ref 80.0–100.0)
Monocytes Absolute: 0.6 10*3/uL (ref 0.1–1.0)
Monocytes Relative: 5 %
Neutro Abs: 7.8 10*3/uL — ABNORMAL HIGH (ref 1.7–7.7)
Neutrophils Relative %: 76 %
Platelet Count: 286 10*3/uL (ref 150–400)
RBC: 4.69 MIL/uL (ref 3.87–5.11)
RDW: 13.2 % (ref 11.5–15.5)
WBC Count: 10.4 10*3/uL (ref 4.0–10.5)
nRBC: 0 % (ref 0.0–0.2)

## 2018-10-07 LAB — FERRITIN: Ferritin: 145 ng/mL (ref 11–307)

## 2018-10-07 LAB — IRON AND TIBC
Iron: 23 ug/dL — ABNORMAL LOW (ref 41–142)
Saturation Ratios: 8 % — ABNORMAL LOW (ref 21–57)
TIBC: 283 ug/dL (ref 236–444)
UIBC: 260 ug/dL (ref 120–384)

## 2018-10-08 ENCOUNTER — Other Ambulatory Visit: Payer: BLUE CROSS/BLUE SHIELD

## 2018-10-08 ENCOUNTER — Encounter: Payer: Self-pay | Admitting: Oncology

## 2018-10-08 ENCOUNTER — Inpatient Hospital Stay: Payer: BLUE CROSS/BLUE SHIELD | Attending: Oncology | Admitting: Oncology

## 2018-10-08 ENCOUNTER — Telehealth: Payer: Self-pay | Admitting: Oncology

## 2018-10-08 VITALS — BP 124/82 | HR 76 | Temp 98.3°F | Resp 17 | Ht 66.0 in | Wt 270.7 lb

## 2018-10-08 DIAGNOSIS — Z79899 Other long term (current) drug therapy: Secondary | ICD-10-CM | POA: Insufficient documentation

## 2018-10-08 DIAGNOSIS — Z7901 Long term (current) use of anticoagulants: Secondary | ICD-10-CM | POA: Diagnosis not present

## 2018-10-08 DIAGNOSIS — D6851 Activated protein C resistance: Secondary | ICD-10-CM

## 2018-10-08 DIAGNOSIS — N920 Excessive and frequent menstruation with regular cycle: Secondary | ICD-10-CM | POA: Diagnosis not present

## 2018-10-08 DIAGNOSIS — Z86711 Personal history of pulmonary embolism: Secondary | ICD-10-CM | POA: Diagnosis not present

## 2018-10-08 DIAGNOSIS — D500 Iron deficiency anemia secondary to blood loss (chronic): Secondary | ICD-10-CM | POA: Diagnosis present

## 2018-10-08 NOTE — Progress Notes (Signed)
Hematology and Oncology Follow Up Visit  Robin Key [id number] [birth year] 33 y.o. 10/08/2018 2:19 PM Robin Key, [name], [name], NP   Principle Diagnosis: 33 year old woman with:  1.  Pulmonary embolism diagnosed and 2012 in the setting of heterozygous factor V Leiden mutation.   2.  Iron deficiency documented on multiple occasions related to chronic menstrual blood losses.  Prior Therapy:  Full dose anticoagulation with Lovenox after she had a C-section on 07/01/2013.   She is status post IV iron in the form of Feraheme as needed.  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 She was switched to Lovenox in June 2018.  Therapy discontinued in March 2019.  Current therapy:   Xarelto 20 mg daily started in June 2019.  IV iron intermittently for iron deficiency anemia due to menorrhagia.  Interim History:  Robin Key is here for a follow-up visit.  Since last visit, she has reported more fatigue and tiredness with periodic heavy menstrual cycles related to Eliquis.  She has reported some occasional gum bleeding after brushing her teeth but no hematochezia or Key.  She continues to work full-time without any decline in ability to do so.  She has been also diagnosed with vitamin D deficiency and currently taking supplements.   Patient denied any alteration mental status, neuropathy, confusion or dizziness.  Denies any headaches or lethargy.  Denies any night sweats, weight loss or changes in appetite.  Denied orthopnea, dyspnea on exertion or chest discomfort.  Denies shortness of breath, difficulty breathing hemoptysis or cough.  Denies any abdominal distention, nausea, early satiety or dyspepsia.  Denies any hematuria, frequency, dysuria or nocturia.  Denies any skin irritation, dryness or rash.  Denies any ecchymosis or petechiae.  Denies any lymphadenopathy or clotting.  Denies any heat or cold intolerance.  Denies any  anxiety or depression.  Remaining review of system is negative.    Medications: I have reviewed the patient's current medications.  Current Outpatient Medications  Medication Sig Dispense Refill  . butalbital-acetaminophen-caffeine (FIORICET, ESGIC) 50-325-40 MG per tablet Take 2 tablets by mouth every 6 (six) hours as needed for headache. 30 tablet 1  . cetirizine (ZYRTEC) 10 MG tablet Take 10 mg by mouth daily.    . diclofenac (VOLTAREN) 75 MG EC tablet TAKE 1 TABLET(75 MG) BY MOUTH TWICE DAILY 30 tablet 0  . fluticasone (FLONASE) 50 MCG/ACT nasal spray Place 2 sprays into both nostrils daily.    . meloxicam (MOBIC) 15 MG tablet Take 1 tablet (15 mg total) by mouth daily. 30 tablet 0  . ondansetron (ZOFRAN-ODT) 8 MG disintegrating tablet Take 1 tablet by mouth 3 (three) times daily as needed.  0  . XARELTO 20 MG TABS tablet TAKE 1 TABLET(20 MG) BY MOUTH DAILY WITH SUPPER 30 tablet 0   Current Facility-Administered Medications  Medication Dose Route Frequency Provider Last Rate Last Dose  . triamcinolone acetonide (KENALOG) 10 MG/ML injection 10 mg  10 mg Other Once [place], Titorya, DPM      . triamcinolone acetonide (KENALOG-40) injection 20 mg  20 mg Other Once [place], Titorya, DPM      . triamcinolone acetonide (KENALOG-40) injection 20 mg  20 mg Other Once [place], Titorya, DPM      . triamcinolone acetonide (KENALOG-40) injection 20 mg  20 mg Other Once [place], Titorya, DPM      . triamcinolone acetonide (KENALOG-40) injection 20 mg  20 mg Other Once Landis Martins, DPM  Allergies:  Allergies  Allergen Reactions  . Ciprofloxacin Other (See Comments)    Felt like "bugs crawling" when on Lovenox  . Shellfish-Derived Products Swelling    Shrimp Shrimp  . Bactrim [Sulfamethoxazole-Trimethoprim] Other (See Comments)    headache Headache   . Oxycodone Nausea And Vomiting  . Percocet [Oxycodone-Acetaminophen] Nausea And Vomiting  . Amoxicillin Rash  . Latex Rash  .  Oxycodone-Acetaminophen Nausea And Vomiting  . Penicillins Rash  . Sulfa Antibiotics Rash    Past Medical History, Surgical history, Social history, and Family History were reviewed and updated.  Physical Exam: Blood pressure 124/82, pulse 76, temperature 98.3 F (36.8 C), temperature source Oral, resp. rate 17, height 5\' 6"  (1.676 m), weight 270 lb 11.2 oz (122.8 kg), SpO2 100 %, unknown if currently breastfeeding.    ECOG: 0   General appearance: Comfortable appearing without any discomfort Head: Normocephalic without any trauma Oropharynx: Mucous membranes are moist and pink without any thrush or ulcers. Eyes: Pupils are equal and round reactive to light. Lymph nodes: No cervical, supraclavicular, inguinal or axillary lymphadenopathy.   Heart:regular rate and rhythm.  S1 and S2 without leg edema. Lung: Clear without any rhonchi or wheezes.  No dullness to percussion. Abdomin: Soft, nontender, nondistended with good bowel sounds.  No hepatosplenomegaly. Musculoskeletal: No joint deformity or effusion.  Full range of motion noted. Neurological: No deficits noted on motor, sensory and deep tendon reflex exam. Skin: No petechial rash or dryness.  Appeared moist.  Psychiatric: Mood and affect appeared appropriate.      Lab Results: Lab Results  Component Value Date   WBC 10.4 10/04/2018   HGB 13.5 10/04/2018   HCT 41.1 10/04/2018   MCV 87.6 10/04/2018   PLT 286 10/04/2018     Chemistry      Component Value Date/Time   NA 139 08/27/2015 1411   K 3.9 08/27/2015 1411   CL 99 07/01/2013 1115   CO2 28 08/27/2015 1411   BUN 11.5 08/27/2015 1411   CREATININE 0.9 08/27/2015 1411      Component Value Date/Time   CALCIUM 9.7 08/27/2015 1411   ALKPHOS 100 08/27/2015 1411   AST 17 08/27/2015 1411   ALT 18 08/27/2015 1411   BILITOT 0.79 08/27/2015 1411     Iron/TIBC/Ferritin/ %Sat    Component Value Date/Time   IRON 23 (L) 10/04/2018 1522   IRON 72 03/14/2017 1510    TIBC 283 10/04/2018 1522   TIBC 263 03/14/2017 1510   FERRITIN 145 10/04/2018 1522   FERRITIN 321 (H) 03/14/2017 1510   IRONPCTSAT 8 (L) 10/04/2018 1522   IRONPCTSAT 28 03/14/2017 1510    Impression and Plan:  33 year old woman with:  1.  Pulmonary embolism diagnosed in 2012.  She has heterozygous factor V Leiden mutation at that time.  He has history of recurrent thromboembolism and has been on Xarelto continuously.  Risks and benefits of continuing this therapy long-term was discussed today and she is agreeable to continue.  She is experiencing minor bleeding issues although her hemoglobin continues to be relatively stable.  For the time being I recommended continuing the same desk and schedule and will reevaluate with the next visit.    2.  Iron deficiency: Related to chronic menstrual blood losses.  Iron studies on October 04, 2018 were down to 23 with saturation of 8%.  Risks and benefits of Feraheme infusion was discussed today.  Arthralgias, myalgias as well as infusion related complications were discussed.  She is agreeable to  proceed and she will receive 510 mg infusion this week.    3. Irregular menstrual cycles: She is having recurrent menstrual cycles at this time which could be contributing to her iron deficiency.   4. Follow-up: Will be in June 2020.  15 minutes was spent with the patient face-to-face today.  More than 50% of time was dedicated to discussing disease status, treatment options, reviewing laboratory data and answering questions regarding future plan of care.   Zola Button, MD 10/08/18

## 2018-10-08 NOTE — Telephone Encounter (Signed)
Gave avs and calendar ° °

## 2018-10-11 ENCOUNTER — Inpatient Hospital Stay: Payer: BLUE CROSS/BLUE SHIELD

## 2018-10-11 VITALS — BP 132/78 | HR 72 | Temp 98.6°F | Resp 18

## 2018-10-11 DIAGNOSIS — D500 Iron deficiency anemia secondary to blood loss (chronic): Secondary | ICD-10-CM | POA: Diagnosis not present

## 2018-10-11 MED ORDER — DIPHENHYDRAMINE HCL 25 MG PO CAPS
ORAL_CAPSULE | ORAL | Status: AC
  Filled 2018-10-11: qty 1

## 2018-10-11 MED ORDER — DIPHENHYDRAMINE HCL 25 MG PO TABS
25.0000 mg | ORAL_TABLET | Freq: Once | ORAL | Status: AC
  Administered 2018-10-11: 25 mg via ORAL
  Filled 2018-10-11: qty 1

## 2018-10-11 MED ORDER — SODIUM CHLORIDE 0.9 % IV SOLN
510.0000 mg | Freq: Once | INTRAVENOUS | Status: AC
  Administered 2018-10-11: 510 mg via INTRAVENOUS
  Filled 2018-10-11: qty 17

## 2018-10-11 MED ORDER — SODIUM CHLORIDE 0.9 % IV SOLN
Freq: Once | INTRAVENOUS | Status: AC
  Administered 2018-10-11: 09:00:00 via INTRAVENOUS
  Filled 2018-10-11: qty 250

## 2018-10-11 NOTE — Patient Instructions (Signed)

## 2018-10-25 ENCOUNTER — Other Ambulatory Visit: Payer: Self-pay | Admitting: Oncology

## 2018-12-12 ENCOUNTER — Other Ambulatory Visit: Payer: Self-pay | Admitting: Oncology

## 2018-12-18 ENCOUNTER — Other Ambulatory Visit: Payer: Self-pay

## 2018-12-18 ENCOUNTER — Encounter: Payer: Self-pay | Admitting: Sports Medicine

## 2018-12-18 ENCOUNTER — Ambulatory Visit (INDEPENDENT_AMBULATORY_CARE_PROVIDER_SITE_OTHER): Payer: BLUE CROSS/BLUE SHIELD

## 2018-12-18 ENCOUNTER — Ambulatory Visit: Payer: BLUE CROSS/BLUE SHIELD | Admitting: Sports Medicine

## 2018-12-18 VITALS — Temp 97.5°F | Resp 16

## 2018-12-18 DIAGNOSIS — M216X2 Other acquired deformities of left foot: Secondary | ICD-10-CM | POA: Diagnosis not present

## 2018-12-18 DIAGNOSIS — M216X1 Other acquired deformities of right foot: Secondary | ICD-10-CM

## 2018-12-18 DIAGNOSIS — M722 Plantar fascial fibromatosis: Secondary | ICD-10-CM

## 2018-12-18 DIAGNOSIS — M79672 Pain in left foot: Secondary | ICD-10-CM

## 2018-12-18 DIAGNOSIS — M79671 Pain in right foot: Secondary | ICD-10-CM

## 2018-12-18 MED ORDER — TRIAMCINOLONE ACETONIDE 10 MG/ML IJ SUSP
10.0000 mg | Freq: Once | INTRAMUSCULAR | Status: AC
  Administered 2018-12-18: 18:00:00 10 mg

## 2018-12-18 NOTE — Patient Instructions (Signed)
If pain is not better or starting to flare. Come back to office for re-evaluation or call to get Shockwave/EPAT treatments in [place]. You can discuss cost of shockwave with Vickie in our billing department.  For dry cracked heels you can try Honey Savior can be purchased at [place] or [employer]  [email or web address]

## 2018-12-18 NOTE — Progress Notes (Signed)
Subjective: Robin Key is a 33 y.o. female patient who returns to office with complaint of heel pain on the left>right; states that the pain has started to flareup since March pain is 6 out of 10 sharp in nature to the plantar heel reports that on the left the pain starts at the medial aspect of the heel and radiates laterally states that she has been doing her stretches wearing her fascial braces and using night splint worse in the morning initially but now pain is constant throughout the day.  Patient denies any changes with medical history besides having issues with her GI system and having to be on multiple vitamins and is concerned that she has some underlying inflammatory condition and is going to see a rheumatologist.  Patient also reports that she is still on Xarelto for her factor V Leiden mutation.  Patient denies nausea vomiting fever chills or any other constitutional symptoms at this time that is related to her foot pain.  Patient Active Problem List   Diagnosis Date Noted  . Cholecystitis, chronic 10/18/2015  . Cholecystitis 10/15/2015  . Leg pain 10/15/2014  . Factor V Leiden, prothrombin gene mutation ([place]) 10/15/2014  . Varicose veins of lower extremities with other complications 08/09/7251  . Pain and swelling of lower extremity 10/08/2013  . Anemia 09/08/2013  . Personal history of venous thrombosis and embolism 09/08/2013  . Cesarean delivery delivered 07/01/2013    Current Outpatient Medications on File Prior to Visit  Medication Sig Dispense Refill  . acyclovir (ZOVIRAX) 400 MG tablet     . butalbital-acetaminophen-caffeine (FIORICET, ESGIC) 50-325-40 MG per tablet Take 2 tablets by mouth every 6 (six) hours as needed for headache. 30 tablet 1  . cetirizine (ZYRTEC) 10 MG tablet Take 10 mg by mouth daily.    . diclofenac (VOLTAREN) 75 MG EC tablet TAKE 1 TABLET(75 MG) BY MOUTH TWICE DAILY 30 tablet 0  . fluticasone (FLONASE) 50 MCG/ACT nasal spray Place 2 sprays  into both nostrils daily.    . meloxicam (MOBIC) 15 MG tablet Take 1 tablet (15 mg total) by mouth daily. 30 tablet 0  . ondansetron (ZOFRAN-ODT) 8 MG disintegrating tablet Take 1 tablet by mouth 3 (three) times daily as needed.  0  . pantoprazole (PROTONIX) 40 MG tablet Take by mouth.    . SUMAtriptan (IMITREX) 50 MG tablet     . SYRINGE-NEEDLE, DISP, 3 ML (MONOJECT SAFETY SYRINGE/SHIELD) 25G X 5/8" 3 ML MISC 1,000 mcg by Misc.(Non-Drug; Combo Route) route every 30 days.    Marland Kitchen triamcinolone (KENALOG) 0.1 % paste Apply to mouth as needed.    . Vitamin D, Ergocalciferol, (DRISDOL) 1.25 MG (50000 UT) CAPS capsule     . XARELTO 20 MG TABS tablet TAKE 1 TABLET(20 MG) BY MOUTH DAILY WITH SUPPER 30 tablet 0   Current Facility-Administered Medications on File Prior to Visit  Medication Dose Route Frequency Provider Last Rate Last Dose  . triamcinolone acetonide (KENALOG) 10 MG/ML injection 10 mg  10 mg Other Once [place], Emanuella Nickle, DPM      . triamcinolone acetonide (KENALOG-40) injection 20 mg  20 mg Other Once [place], Daouda Lonzo, DPM      . triamcinolone acetonide (KENALOG-40) injection 20 mg  20 mg Other Once [place], Victory Dres[place], DPM      . triamcinolone acetonide (KENALOG-40) injection 20 mg  20 mg Other Once [place], Tysheem Accardo, DPM      . triamcinolone acetonide (KENALOG-40) injection 20 mg  20 mg Other Once [place], [place],  DPM        Allergies  Allergen Reactions  . Ciprofloxacin Other (See Comments)    Felt like "bugs crawling" when on Lovenox  . Neomycin-Bacitracin Zn-Polymyx Rash  . Shellfish Allergy Rash and Swelling    Shrimp   . Shellfish-Derived Products Swelling    Shrimp Shrimp  . Bactrim [Sulfamethoxazole-Trimethoprim] Other (See Comments)    headache Headache   . Oxycodone Nausea And Vomiting  . Percocet [Oxycodone-Acetaminophen] Nausea And Vomiting  . Amoxicillin Rash  . Latex Rash  . Oxycodone-Acetaminophen Nausea And Vomiting  . Penicillins Rash  . Sulfa Antibiotics Rash  .  Sulfasalazine Rash    Objective: Physical Exam General: The patient is alert and oriented x3 in no acute distress.  Obese female patient.  Dermatology: Skin is warm, dry and supple bilateral lower extremities. Nails 1-10 are normal. There is no erythema, + focal edema bilateral ankle, no eccymosis, no open lesions present. Integument is otherwise unremarkable.  Vascular: Dorsalis Pedis pulse and Posterior Tibial pulse are 2/4 bilateral. Capillary fill time is immediate to all digits.  Neurological: Grossly intact to light touch with an achilles reflex of +2/5 and a negative Tinel's sign bilateral.  Musculoskeletal: Tenderness to palpation at the medial greater than central and lateral calcaneal tubercale and through the insertion of the plantar fascia on the left and medial tubercale on right.  There is no reproducible tenderness along the peroneal tendon bilateral. There is decreased Ankle joint range of motion bilateral.  Pes planus foot type.  All other joints range of motion within normal limits bilateral. Strength 5/5 in all groups bilateral.   Assessment and Plan: Problem List Items Addressed This Visit    None    Visit Diagnoses    Plantar fasciitis, left    -  Primary   Relevant Medications   triamcinolone acetonide (KENALOG) 10 MG/ML injection 10 mg (Completed) (Start on 12/18/2018  6:00 PM)   Other Relevant Orders   DG Foot Complete Right   DG Foot Complete Left   Plantar fasciitis, right       Relevant Medications   triamcinolone acetonide (KENALOG) 10 MG/ML injection 10 mg (Completed) (Start on 12/18/2018  6:00 PM)   Other Relevant Orders   DG Foot Complete Right   DG Foot Complete Left   Left foot pain       Right foot pain       Acquired equinus deformity of both feet       Class 2 severe obesity due to excess calories with serious comorbidity in adult, unspecified BMI ([place])         -Complete examination performed.  -X-rays reviewed this visit consistent with  enlargement of the plantar heel spurs left greater than right with thickening of the band of the plantar fascia as previously noted and other x-rays there is midtarsal breech supportive of planus foot type.  No fracture or dislocation. -Re-Discussed with patient in detail the condition of plantar fasciitis with tendinitis and recurrent pain, how this occurs and general treatment options. Explained both conservative and surgical treatments.  -After oral consent and aseptic prep, injected a mixture containing 1 ml of 2% plain lidocaine, 1 ml 0.5% plain marcaine, 0.5 ml of kenalog 10 and 0.5 ml of dexamethasone phosphate into right and left heel at medial aspect of the plantar fascia -Applied plantar fascial strapping bilateral and advised patient to keep taping clean dry and intact for the next 5 days and then may resume her plantar fascial  braces -Recommended continue with good supportive shoes  -Continue with stretching, icing, Night splint  -Advised patient for dry skin on heels may try honey salve -Encouraged patient to consider shockwave/EPAT treatment in [place] if pain continues and is not better after 3 to 4 weeks or if pain starts to flare his back sooner -Patient to return to office as needed or sooner if problems or questions arise.  Landis Martins, DPM

## 2018-12-19 ENCOUNTER — Other Ambulatory Visit: Payer: Self-pay | Admitting: Sports Medicine

## 2018-12-19 DIAGNOSIS — M722 Plantar fascial fibromatosis: Secondary | ICD-10-CM

## 2018-12-19 DIAGNOSIS — M216X1 Other acquired deformities of right foot: Secondary | ICD-10-CM

## 2019-01-17 ENCOUNTER — Inpatient Hospital Stay: Payer: BC Managed Care – PPO

## 2019-01-17 ENCOUNTER — Inpatient Hospital Stay: Payer: BC Managed Care – PPO | Attending: Oncology | Admitting: Oncology

## 2019-01-17 ENCOUNTER — Other Ambulatory Visit: Payer: Self-pay

## 2019-01-17 ENCOUNTER — Telehealth: Payer: Self-pay

## 2019-01-17 VITALS — BP 114/70 | HR 79 | Temp 98.3°F | Resp 20 | Wt 276.3 lb

## 2019-01-17 DIAGNOSIS — D500 Iron deficiency anemia secondary to blood loss (chronic): Secondary | ICD-10-CM

## 2019-01-17 DIAGNOSIS — Z7901 Long term (current) use of anticoagulants: Secondary | ICD-10-CM | POA: Diagnosis not present

## 2019-01-17 DIAGNOSIS — Z86711 Personal history of pulmonary embolism: Secondary | ICD-10-CM | POA: Insufficient documentation

## 2019-01-17 DIAGNOSIS — Z7951 Long term (current) use of inhaled steroids: Secondary | ICD-10-CM

## 2019-01-17 DIAGNOSIS — D6851 Activated protein C resistance: Secondary | ICD-10-CM

## 2019-01-17 DIAGNOSIS — Z79899 Other long term (current) drug therapy: Secondary | ICD-10-CM

## 2019-01-17 DIAGNOSIS — E559 Vitamin D deficiency, unspecified: Secondary | ICD-10-CM

## 2019-01-17 DIAGNOSIS — N920 Excessive and frequent menstruation with regular cycle: Secondary | ICD-10-CM

## 2019-01-17 DIAGNOSIS — Z791 Long term (current) use of non-steroidal anti-inflammatories (NSAID): Secondary | ICD-10-CM

## 2019-01-17 LAB — CBC WITH DIFFERENTIAL/PLATELET
Abs Immature Granulocytes: 0.02 10*3/uL (ref 0.00–0.07)
Basophils Absolute: 0.1 10*3/uL (ref 0.0–0.1)
Basophils Relative: 1 %
Eosinophils Absolute: 0.1 10*3/uL (ref 0.0–0.5)
Eosinophils Relative: 1 %
HCT: 43.3 % (ref 36.0–46.0)
Hemoglobin: 14 g/dL (ref 12.0–15.0)
Immature Granulocytes: 0 %
Lymphocytes Relative: 18 %
Lymphs Abs: 1.4 10*3/uL (ref 0.7–4.0)
MCH: 29.4 pg (ref 26.0–34.0)
MCHC: 32.3 g/dL (ref 30.0–36.0)
MCV: 91 fL (ref 80.0–100.0)
Monocytes Absolute: 0.4 10*3/uL (ref 0.1–1.0)
Monocytes Relative: 6 %
Neutro Abs: 5.7 10*3/uL (ref 1.7–7.7)
Neutrophils Relative %: 74 %
Platelets: 286 10*3/uL (ref 150–400)
RBC: 4.76 MIL/uL (ref 3.87–5.11)
RDW: 12.6 % (ref 11.5–15.5)
WBC: 7.7 10*3/uL (ref 4.0–10.5)
nRBC: 0 % (ref 0.0–0.2)

## 2019-01-17 LAB — IRON AND TIBC
Iron: 82 ug/dL (ref 41–142)
Saturation Ratios: 29 % (ref 21–57)
TIBC: 285 ug/dL (ref 236–444)
UIBC: 203 ug/dL (ref 120–384)

## 2019-01-17 LAB — FERRITIN: Ferritin: 235 ng/mL (ref 11–307)

## 2019-01-17 NOTE — Telephone Encounter (Signed)
Called patient and let her know that per Dr. Alen Blew her iron level is normal and there is no need for an iron infusion at this time. Robin Key verbalized understanding.

## 2019-01-17 NOTE — Telephone Encounter (Signed)
-----   Message from Wyatt Portela, MD sent at 01/17/2019  1:02 PM EDT ----- Please let her know her iron is normal. No need for iron infusion at this time.

## 2019-01-17 NOTE — Progress Notes (Signed)
Hematology and Oncology Follow Up Visit  Robin Key [id number] 1985/12/15 33 y.o. 01/17/2019 12:06 PM Robin Key, [name], Robin Melena, NP   Principle Diagnosis: 33 year old woman with:  1.  Factor V Leiden mutation is heterozygous manifesting with pulmonary embolism diagnosed and 2012.    2.  Iron deficiency anemia related to chronic menstrual blood losses.   Prior Therapy:  Full dose anticoagulation with Lovenox after she had a C-section on 07/01/2013.   She is status post IV iron in the form of Feraheme as needed.  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 She was switched to Lovenox in June 2018.  Therapy discontinued in March 2019.  Current therapy:   Xarelto 20 mg daily started in June 2019.  IV iron infusion as needed.  She is intolerant to oral iron.  Interim History:  Robin Key returns today for a repeat evaluation.  Since last visit, she reports no major complaints.  She reports that she has been diagnosed with vitamin D deficiency and is been started on supplements.  She continues to take Xarelto without any issues.  She did miss the last week as she was taking ibuprofen for back pain.  Continues to be active and continues to work full-time.  She denies any recent hospitalizations or illnesses.  She denies any hematochezia or Key.  Menstrual cycles remain heavy at times.  She denied headaches, blurry vision, syncope or seizures.  Denies any fevers, chills or sweats.  Denied chest pain, palpitation, orthopnea or leg edema.  Denied cough, wheezing or hemoptysis.  Denied nausea, vomiting or abdominal pain.  Denies any constipation or diarrhea.  Denies any frequency urgency or hesitancy.  Denies any arthralgias or myalgias.  Denies any skin rashes or lesions.  Denies any bleeding or clotting tendency.  Denies any easy bruising.  Denies any hair or nail changes.  Denies any anxiety or depression.  Remaining review of  system is negative.      Medications: I have reviewed the patient's current medications.  Current Outpatient Medications  Medication Sig Dispense Refill  . acyclovir (ZOVIRAX) 400 MG tablet     . butalbital-acetaminophen-caffeine (FIORICET, ESGIC) 50-325-40 MG per tablet Take 2 tablets by mouth every 6 (six) hours as needed for headache. 30 tablet 1  . cetirizine (ZYRTEC) 10 MG tablet Take 10 mg by mouth daily.    . diclofenac (VOLTAREN) 75 MG EC tablet TAKE 1 TABLET(75 MG) BY MOUTH TWICE DAILY 30 tablet 0  . fluticasone (FLONASE) 50 MCG/ACT nasal spray Place 2 sprays into both nostrils daily.    . meloxicam (MOBIC) 15 MG tablet Take 1 tablet (15 mg total) by mouth daily. 30 tablet 0  . ondansetron (ZOFRAN-ODT) 8 MG disintegrating tablet Take 1 tablet by mouth 3 (three) times daily as needed.  0  . pantoprazole (PROTONIX) 40 MG tablet Take by mouth.    . SUMAtriptan (IMITREX) 50 MG tablet     . SYRINGE-NEEDLE, DISP, 3 ML (MONOJECT SAFETY SYRINGE/SHIELD) 25G X 5/8" 3 ML MISC 1,000 mcg by Misc.(Non-Drug; Combo Route) route every 30 days.    Marland Kitchen triamcinolone (KENALOG) 0.1 % paste Apply to mouth as needed.    . Vitamin D, Ergocalciferol, (DRISDOL) 1.25 MG (50000 UT) CAPS capsule     . XARELTO 20 MG TABS tablet TAKE 1 TABLET(20 MG) BY MOUTH DAILY WITH SUPPER 30 tablet 0   Current Facility-Administered Medications  Medication Dose Route Frequency Provider Last Rate Last Dose  .  triamcinolone acetonide (KENALOG) 10 MG/ML injection 10 mg  10 mg Other Once [place], [place], DPM      . triamcinolone acetonide (KENALOG-40) injection 20 mg  20 mg Other Once [place], [place], DPM      . triamcinolone acetonide (KENALOG-40) injection 20 mg  20 mg Other Once [place], Titorya, DPM      . triamcinolone acetonide (KENALOG-40) injection 20 mg  20 mg Other Once [place], Titorya, DPM      . triamcinolone acetonide (KENALOG-40) injection 20 mg  20 mg Other Once Landis Martins, DPM         Allergies:   Allergies  Allergen Reactions  . Ciprofloxacin Other (See Comments)    Felt like "bugs crawling" when on Lovenox  . Neomycin-Bacitracin Zn-Polymyx Rash  . Shellfish Allergy Rash and Swelling    Shrimp   . Shellfish-Derived Products Swelling    Shrimp Shrimp  . Bactrim [Sulfamethoxazole-Trimethoprim] Other (See Comments)    headache Headache   . Oxycodone Nausea And Vomiting  . Percocet [Oxycodone-Acetaminophen] Nausea And Vomiting  . Amoxicillin Rash  . Latex Rash  . Oxycodone-Acetaminophen Nausea And Vomiting  . Penicillins Rash  . Sulfa Antibiotics Rash  . Sulfasalazine Rash    Past Medical History, Surgical history, Social history, and Family History were reviewed and updated.  Physical Exam: Blood pressure 114/70, pulse 79, temperature 98.3 F (36.8 C), temperature source Oral, resp. rate 20, weight 276 lb 4.8 oz (125.3 kg), SpO2 100 %, unknown if currently breastfeeding.    ECOG: 0   General appearance: Alert, awake without any distress. Head: Atraumatic without abnormalities Oropharynx: Without any thrush or ulcers. Eyes: No scleral icterus. Lymph nodes: No lymphadenopathy noted in the cervical, supraclavicular, or axillary nodes Heart:regular rate and rhythm, without any murmurs or gallops.   Lung: Clear to auscultation without any rhonchi, wheezes or dullness to percussion. Abdomin: Soft, nontender without any shifting dullness or ascites. Musculoskeletal: No clubbing or cyanosis. Neurological: No motor or sensory deficits. Skin: No rashes or lesions.       Lab Results: Lab Results  Component Value Date   WBC 7.7 01/17/2019   HGB 14.0 01/17/2019   HCT 43.3 01/17/2019   MCV 91.0 01/17/2019   PLT 286 01/17/2019     Chemistry      Component Value Date/Time   NA 139 08/27/2015 1411   K 3.9 08/27/2015 1411   CL 99 07/01/2013 1115   CO2 28 08/27/2015 1411   BUN 11.5 08/27/2015 1411   CREATININE 0.9 08/27/2015 1411      Component Value  Date/Time   CALCIUM 9.7 08/27/2015 1411   ALKPHOS 100 08/27/2015 1411   AST 17 08/27/2015 1411   ALT 18 08/27/2015 1411   BILITOT 0.79 08/27/2015 1411     Iron/TIBC/Ferritin/ %Sat    Component Value Date/Time   IRON 23 (L) 10/04/2018 1522   IRON 72 03/14/2017 1510   TIBC 283 10/04/2018 1522   TIBC 263 03/14/2017 1510   FERRITIN 145 10/04/2018 1522   FERRITIN 321 (H) 03/14/2017 1510   IRONPCTSAT 8 (L) 10/04/2018 1522   IRONPCTSAT 28 03/14/2017 1510    Impression and Plan:  33 year old woman with:  1.  Venous thromboembolism in the setting of heterozygous factor V Leiden mutation diagnosed in 2012.  At that time she was found to have a pulmonary embolism and chronically anticoagulated on Xarelto.   The rationale for continuing Xarelto long-term was reviewed at this time.  Potential complications include bleeding including hematochezia, Key  and hemoptysis.  Given her risk of recurrent thrombosis I recommended continuing Xarelto at this time.  She has no objections to this at this time.    2.  Iron deficiency: Her last intravenous iron was given in February 2020.  Her iron deficiency is related to chronic menstrual blood losses.  Risks and benefits of repeat intravenous iron in the future was discussed.  Complications that include arthralgias, myalgias among others.  Infusion related complications and anaphylaxis although rare has been documented.  She is agreeable to proceed with that if needed in the future.  Her hemoglobin today is adequate and I do not anticipate she will require iron for another 3 or 6 months.    3. Irregular menstrual cycles: Unchanged at this time.  She continues to follow with gynecology regarding this issue.   4. Follow-up: Will be in 3 months.  15 minutes was spent with the patient face-to-face today.  More than 50% of time was spent on reviewing her disease status, laboratory data update, treatment options and answering questions regarding plan of  care.   Zola Button, MD 01/17/19

## 2019-01-20 ENCOUNTER — Telehealth: Payer: Self-pay | Admitting: Oncology

## 2019-01-20 NOTE — Telephone Encounter (Signed)
I could not reach the patient voicemail not set up. I will mail

## 2019-04-22 ENCOUNTER — Telehealth: Payer: Self-pay | Admitting: Oncology

## 2019-04-22 NOTE — Telephone Encounter (Signed)
Returned patient's phone call regarding rescheduling an appointment, voicemail is not set up.

## 2019-04-23 ENCOUNTER — Other Ambulatory Visit: Payer: Self-pay

## 2019-04-23 ENCOUNTER — Inpatient Hospital Stay: Payer: BLUE CROSS/BLUE SHIELD

## 2019-04-23 ENCOUNTER — Inpatient Hospital Stay: Payer: BLUE CROSS/BLUE SHIELD | Admitting: Oncology

## 2019-04-23 MED ORDER — RIVAROXABAN 20 MG PO TABS: ORAL_TABLET | ORAL | 0 refills | Status: DC

## 2019-05-16 ENCOUNTER — Ambulatory Visit: Payer: Self-pay

## 2019-05-16 ENCOUNTER — Other Ambulatory Visit: Payer: Self-pay

## 2019-05-16 DIAGNOSIS — M79676 Pain in unspecified toe(s): Secondary | ICD-10-CM

## 2019-05-16 DIAGNOSIS — M722 Plantar fascial fibromatosis: Secondary | ICD-10-CM

## 2019-05-16 NOTE — Progress Notes (Signed)
Patient is here today with complaint of bilateral heel pain left over right.  She states is been ongoing for several months, she is tried injections, wearing her boot, stretching, ice and supportive shoe.  She is had no relief in her pain so far.  ESWT administered for 12 J bilateral and tolerated well.  She is to follow-up next week for second treatment.

## 2019-05-30 ENCOUNTER — Ambulatory Visit: Payer: Self-pay

## 2019-05-30 ENCOUNTER — Other Ambulatory Visit: Payer: Self-pay

## 2019-05-30 DIAGNOSIS — M722 Plantar fascial fibromatosis: Secondary | ICD-10-CM

## 2019-05-30 DIAGNOSIS — M79676 Pain in unspecified toe(s): Secondary | ICD-10-CM

## 2019-06-02 NOTE — Progress Notes (Signed)
Patient is here today with complaint of bilateral heel pain left over right.  She states is been ongoing for several months, she is tried injections, wearing her boot, stretching, ice and supportive shoe.  She is had no relief in her pain so far.  ESWT administered for 19 J bilateral and tolerated well.  She is to follow-up next week for 3rd treatment.

## 2019-06-06 ENCOUNTER — Other Ambulatory Visit: Payer: Self-pay

## 2019-06-06 ENCOUNTER — Other Ambulatory Visit (INDEPENDENT_AMBULATORY_CARE_PROVIDER_SITE_OTHER): Payer: Self-pay

## 2019-06-06 DIAGNOSIS — M79676 Pain in unspecified toe(s): Secondary | ICD-10-CM

## 2019-06-18 ENCOUNTER — Other Ambulatory Visit: Payer: Self-pay | Admitting: Sports Medicine

## 2019-06-18 ENCOUNTER — Ambulatory Visit (INDEPENDENT_AMBULATORY_CARE_PROVIDER_SITE_OTHER): Payer: Self-pay | Admitting: Sports Medicine

## 2019-06-18 ENCOUNTER — Other Ambulatory Visit: Payer: Self-pay

## 2019-06-18 ENCOUNTER — Encounter: Payer: Self-pay | Admitting: Sports Medicine

## 2019-06-18 DIAGNOSIS — M79673 Pain in unspecified foot: Secondary | ICD-10-CM

## 2019-06-18 DIAGNOSIS — M722 Plantar fascial fibromatosis: Secondary | ICD-10-CM

## 2019-06-18 DIAGNOSIS — M79672 Pain in left foot: Secondary | ICD-10-CM

## 2019-06-18 DIAGNOSIS — M79671 Pain in right foot: Secondary | ICD-10-CM

## 2019-06-18 MED ORDER — HYDROCODONE-ACETAMINOPHEN 7.5-325 MG PO TABS: 1.0000 | ORAL_TABLET | Freq: Four times a day (QID) | ORAL | 0 refills | Status: DC | PRN

## 2019-06-18 NOTE — Progress Notes (Signed)
Subjective: Robin Key is a 33 y.o. female patient who returns to office with complaint of heel pain on the left>right; states that the pain after shockwave appears to be getting worse pain is sharp 5 out of 10 constant in nature reports that she has been stretching using her rolling pain and icing reports that the pain is worse as compared to previous.  Patient denies any other pedal complaints at this time.  Patient Active Problem List   Diagnosis Date Noted  . Cholecystitis, chronic 10/18/2015  . Cholecystitis 10/15/2015  . Leg pain 10/15/2014  . Factor V Leiden, prothrombin gene mutation ([place]) 10/15/2014  . Varicose veins of lower extremities with other complications [id number]  . Pain and swelling of lower extremity 10/08/2013  . Anemia 09/08/2013  . Personal history of venous thrombosis and embolism 09/08/2013  . Cesarean delivery delivered 07/01/2013    Current Outpatient Medications on File Prior to Visit  Medication Sig Dispense Refill  . acyclovir (ZOVIRAX) 400 MG tablet     . butalbital-acetaminophen-caffeine (FIORICET, ESGIC) 50-325-40 MG per tablet Take 2 tablets by mouth every 6 (six) hours as needed for headache. 30 tablet 1  . cetirizine (ZYRTEC) 10 MG tablet Take 10 mg by mouth daily.    . diclofenac (VOLTAREN) 75 MG EC tablet TAKE 1 TABLET(75 MG) BY MOUTH TWICE DAILY 30 tablet 0  . fluticasone (FLONASE) 50 MCG/ACT nasal spray Place 2 sprays into both nostrils daily.    . meloxicam (MOBIC) 15 MG tablet Take 1 tablet (15 mg total) by mouth daily. 30 tablet 0  . ondansetron (ZOFRAN-ODT) 8 MG disintegrating tablet Take 1 tablet by mouth 3 (three) times daily as needed.  0  . pantoprazole (PROTONIX) 40 MG tablet Take by mouth.    . rivaroxaban (XARELTO) 20 MG TABS tablet TAKE 1 TABLET(20 MG) BY MOUTH DAILY WITH SUPPER 30 tablet 0  . SUMAtriptan (IMITREX) 50 MG tablet     . SYRINGE-NEEDLE, DISP, 3 ML (MONOJECT SAFETY SYRINGE/SHIELD) 25G X 5/8" 3 ML MISC 1,000 mcg by  Misc.(Non-Drug; Combo Route) route every 30 days.    Marland Kitchen triamcinolone (KENALOG) 0.1 % paste Apply to mouth as needed.    . Vitamin D, Ergocalciferol, (DRISDOL) 1.25 MG (50000 UT) CAPS capsule      Current Facility-Administered Medications on File Prior to Visit  Medication Dose Route Frequency Provider Last Rate Last Dose  . triamcinolone acetonide (KENALOG) 10 MG/ML injection 10 mg  10 mg Other Once [place], Kamren Heskett, DPM      . triamcinolone acetonide (KENALOG-40) injection 20 mg  20 mg Other Once [place], Adolphe Fortunato, DPM      . triamcinolone acetonide (KENALOG-40) injection 20 mg  20 mg Other Once [place], Stacyann Mcconaughy, DPM      . triamcinolone acetonide (KENALOG-40) injection 20 mg  20 mg Other Once [place], Yuvan Medinger, DPM      . triamcinolone acetonide (KENALOG-40) injection 20 mg  20 mg Other Once Landis Martins, DPM        Allergies  Allergen Reactions  . Ciprofloxacin Other (See Comments)    Felt like "bugs crawling" when on Lovenox  . Neomycin-Bacitracin Zn-Polymyx Rash  . Shellfish Allergy Rash and Swelling    Shrimp   . Shellfish-Derived Products Swelling    Shrimp Shrimp  . Bactrim [Sulfamethoxazole-Trimethoprim] Other (See Comments)    headache Headache   . Oxycodone Nausea And Vomiting  . Percocet [Oxycodone-Acetaminophen] Nausea And Vomiting  . Amoxicillin Rash  . Latex Rash  . Oxycodone-Acetaminophen Nausea  And Vomiting  . Penicillins Rash  . Sulfa Antibiotics Rash  . Sulfasalazine Rash    Objective: Physical Exam General: The patient is alert and oriented x3 in no acute distress.  Obese female patient.  Dermatology: Skin is warm, dry and supple bilateral lower extremities. Nails 1-10 are normal. There is no erythema, + focal edema bilateral ankle, no eccymosis, no open lesions present. Integument is otherwise unremarkable.  Vascular: Dorsalis Pedis pulse and Posterior Tibial pulse are 2/4 bilateral. Capillary fill time is immediate to all digits.  Neurological: Grossly  intact to light touch with an achilles reflex of +2/5 and a negative Tinel's sign bilateral.  Musculoskeletal: Tenderness to palpation at the medial greater than central and lateral calcaneal tubercale and through the insertion of the plantar fascia on the left and medial tubercale on right.  There is no reproducible tenderness along the peroneal tendon bilateral. There is decreased Ankle joint range of motion bilateral.  Pes planus foot type.  All other joints range of motion within normal limits bilateral. Strength 5/5 in all groups bilateral.   Assessment and Plan: Problem List Items Addressed This Visit    None    Visit Diagnoses    Plantar fasciitis    -  Primary   Inflammatory heel pain, unspecified laterality       Relevant Medications   HYDROcodone-acetaminophen (NORCO) 7.5-325 MG tablet   Left foot pain       Right foot pain         -Complete examination performed.  -Discussed with patient normal response after having shockwave treatment -Prescribed Norco to take for pain and advised patient to refrain from icing or any other over-the-counter anti-inflammatory medication -Advised patient to use cam boot if needed -Advised patient to continue as scheduled with fourth EPAT/shockwave treatment -Patient to return to office as needed or sooner if problems or questions arise.  Landis Martins, DPM

## 2019-07-11 ENCOUNTER — Other Ambulatory Visit: Payer: Self-pay

## 2019-08-20 ENCOUNTER — Telehealth: Payer: Self-pay

## 2019-08-20 ENCOUNTER — Telehealth: Payer: Self-pay | Admitting: Oncology

## 2019-08-20 NOTE — Telephone Encounter (Signed)
Called patient and made her aware that she will receive a call from the scheduling department to get her scheduled. Patient verbalized understanding.

## 2019-08-20 NOTE — Telephone Encounter (Signed)
Scheduled per 1/13 sch msg. Called and spoke with pt, confirmed 1/27 appt

## 2019-08-20 NOTE — Telephone Encounter (Signed)
-----   Message from Wyatt Portela, MD sent at 08/20/2019  9:15 AM EST ----- Yes. Thanks ----- Message ----- From: Tami Lin, RN Sent: 08/20/2019   9:07 AM EST To: Wyatt Portela, MD  Patient called and wants to make you aware that she has been on her menstrual cycle x 1 month. She is requesting to see you and have labs drawn because she is worried about her iron level. Ok to schedule? Robin Key

## 2019-08-28 ENCOUNTER — Ambulatory Visit: Payer: Self-pay | Admitting: Oncology

## 2019-08-28 ENCOUNTER — Other Ambulatory Visit: Payer: Self-pay

## 2019-09-03 ENCOUNTER — Inpatient Hospital Stay: Payer: BLUE CROSS/BLUE SHIELD | Attending: Oncology

## 2019-09-03 ENCOUNTER — Other Ambulatory Visit: Payer: Self-pay

## 2019-09-03 ENCOUNTER — Inpatient Hospital Stay (HOSPITAL_BASED_OUTPATIENT_CLINIC_OR_DEPARTMENT_OTHER): Payer: BLUE CROSS/BLUE SHIELD | Admitting: Oncology

## 2019-09-03 VITALS — BP 115/81 | HR 72 | Temp 97.6°F | Resp 18 | Ht 66.0 in | Wt 282.8 lb

## 2019-09-03 DIAGNOSIS — N920 Excessive and frequent menstruation with regular cycle: Secondary | ICD-10-CM | POA: Diagnosis not present

## 2019-09-03 DIAGNOSIS — Z86711 Personal history of pulmonary embolism: Secondary | ICD-10-CM | POA: Insufficient documentation

## 2019-09-03 DIAGNOSIS — D500 Iron deficiency anemia secondary to blood loss (chronic): Secondary | ICD-10-CM | POA: Diagnosis not present

## 2019-09-03 DIAGNOSIS — Z7901 Long term (current) use of anticoagulants: Secondary | ICD-10-CM | POA: Diagnosis not present

## 2019-09-03 DIAGNOSIS — D6851 Activated protein C resistance: Secondary | ICD-10-CM | POA: Insufficient documentation

## 2019-09-03 LAB — CBC WITH DIFFERENTIAL/PLATELET
Abs Immature Granulocytes: 0.03 10*3/uL (ref 0.00–0.07)
Basophils Absolute: 0.1 10*3/uL (ref 0.0–0.1)
Basophils Relative: 1 %
Eosinophils Absolute: 0.1 10*3/uL (ref 0.0–0.5)
Eosinophils Relative: 2 %
HCT: 39.8 % (ref 36.0–46.0)
Hemoglobin: 13.3 g/dL (ref 12.0–15.0)
Immature Granulocytes: 0 %
Lymphocytes Relative: 19 %
Lymphs Abs: 1.7 10*3/uL (ref 0.7–4.0)
MCH: 29.5 pg (ref 26.0–34.0)
MCHC: 33.4 g/dL (ref 30.0–36.0)
MCV: 88.2 fL (ref 80.0–100.0)
Monocytes Absolute: 0.6 10*3/uL (ref 0.1–1.0)
Monocytes Relative: 6 %
Neutro Abs: 6.5 10*3/uL (ref 1.7–7.7)
Neutrophils Relative %: 72 %
Platelets: 291 10*3/uL (ref 150–400)
RBC: 4.51 MIL/uL (ref 3.87–5.11)
RDW: 12.8 % (ref 11.5–15.5)
WBC: 9 10*3/uL (ref 4.0–10.5)
nRBC: 0 % (ref 0.0–0.2)

## 2019-09-03 NOTE — Progress Notes (Signed)
Hematology and Oncology Follow Up Visit  Robin Key [id number] Jan 13, 1986 34 y.o. 09/03/2019 3:18 PM [name], [name], [name], [name]*   Principle Diagnosis: 34 year old woman with:  1.  Heterozygous factor V Leiden mutation with a pulmonary embolism noted in 2012.  Is chronically anticoagulated with Xarelto.  2.  Iron deficiency anemia due to chronic menstrual blood losses and chronic anticoagulation since 2012.  Prior Therapy:  Full dose anticoagulation with Lovenox after she had a C-section on 07/01/2013.   She is status post IV iron in the form of Feraheme with the last treatment given in March 2020.  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 She was switched to Lovenox in June 2018.  Therapy discontinued in March 2019.  Current therapy:   Xarelto 20 mg daily started in June 2019.   Interim History:  Robin Key is here for a follow-up visit.  Since the last visit, she reports no major changes in her health.  She is reporting heavier menstrual cycles for the last 6 weeks.  She does report increased fatigue tiredness but no thrombosis episodes.  She has been on Xarelto although she has stopped it for the last month because of heavy menstrual bleeding.  She denies recent hospitalization or illnesses.  She denies any chest pain or palpitation.  Continues to work full-time.      Medications: Reviewed without changes. Current Outpatient Medications  Medication Sig Dispense Refill  . acyclovir (ZOVIRAX) 400 MG tablet     . butalbital-acetaminophen-caffeine (FIORICET, ESGIC) 50-325-40 MG per tablet Take 2 tablets by mouth every 6 (six) hours as needed for headache. 30 tablet 1  . cetirizine (ZYRTEC) 10 MG tablet Take 10 mg by mouth daily.    . diclofenac (VOLTAREN) 75 MG EC tablet TAKE 1 TABLET(75 MG) BY MOUTH TWICE DAILY 30 tablet 0  . fluticasone (FLONASE) 50 MCG/ACT nasal spray Place 2 sprays into  both nostrils daily.    Marland Kitchen HYDROcodone-acetaminophen (NORCO) 7.5-325 MG tablet Take 1 tablet by mouth every 6 (six) hours as needed for moderate pain. 21 tablet 0  . meloxicam (MOBIC) 15 MG tablet Take 1 tablet (15 mg total) by mouth daily. 30 tablet 0  . ondansetron (ZOFRAN-ODT) 8 MG disintegrating tablet Take 1 tablet by mouth 3 (three) times daily as needed.  0  . pantoprazole (PROTONIX) 40 MG tablet Take by mouth.    . rivaroxaban (XARELTO) 20 MG TABS tablet TAKE 1 TABLET(20 MG) BY MOUTH DAILY WITH SUPPER 30 tablet 0  . SUMAtriptan (IMITREX) 50 MG tablet     . SYRINGE-NEEDLE, DISP, 3 ML (MONOJECT SAFETY SYRINGE/SHIELD) 25G X 5/8" 3 ML MISC 1,000 mcg by Misc.(Non-Drug; Combo Route) route every 30 days.    Marland Kitchen triamcinolone (KENALOG) 0.1 % paste Apply to mouth as needed.    . Vitamin D, Ergocalciferol, (DRISDOL) 1.25 MG (50000 UT) CAPS capsule      Current Facility-Administered Medications  Medication Dose Route Frequency Provider Last Rate Last Admin  . triamcinolone acetonide (KENALOG) 10 MG/ML injection 10 mg  10 mg Other Once [place], Titorya, DPM      . triamcinolone acetonide (KENALOG-40) injection 20 mg  20 mg Other Once [place], Titorya, DPM      . triamcinolone acetonide (KENALOG-40) injection 20 mg  20 mg Other Once [place], Titorya, DPM      . triamcinolone acetonide (KENALOG-40) injection 20 mg  20 mg Other Once Landis Martins, DPM      .  triamcinolone acetonide (KENALOG-40) injection 20 mg  20 mg Other Once Landis Martins, DPM         Allergies:  Allergies  Allergen Reactions  . Ciprofloxacin Other (See Comments)    Felt like "bugs crawling" when on Lovenox  . Neomycin-Bacitracin Zn-Polymyx Rash  . Shellfish Allergy Rash and Swelling    Shrimp   . Shellfish-Derived Products Swelling    Shrimp Shrimp  . Bactrim [Sulfamethoxazole-Trimethoprim] Other (See Comments)    headache Headache   . Oxycodone Nausea And Vomiting  . Percocet [Oxycodone-Acetaminophen] Nausea And  Vomiting  . Amoxicillin Rash  . Latex Rash  . Oxycodone-Acetaminophen Nausea And Vomiting  . Penicillins Rash  . Sulfa Antibiotics Rash  . Sulfasalazine Rash      Physical Exam:  Blood pressure 115/81, pulse 72, temperature 97.6 F (36.4 C), temperature source Temporal, resp. rate 18, height 5\' 6"  (1.676 m), weight 282 lb 12.8 oz (128.3 kg), SpO2 100 %, unknown if currently breastfeeding.    ECOG: 0    General appearance: Comfortable appearing without any discomfort Head: Normocephalic without any trauma Oropharynx: Mucous membranes are moist and pink without any thrush or ulcers. Eyes: Pupils are equal and round reactive to light. Lymph nodes: No cervical, supraclavicular, inguinal or axillary lymphadenopathy.   Heart:regular rate and rhythm.  S1 and S2 without leg edema. Lung: Clear without any rhonchi or wheezes.  No dullness to percussion. Abdomin: Soft, nontender, nondistended with good bowel sounds.  No hepatosplenomegaly. Musculoskeletal: No joint deformity or effusion.  Full range of motion noted. Neurological: No deficits noted on motor, sensory and deep tendon reflex exam. Skin: No petechial rash or dryness.  Appeared moist.  Psychiatric: Mood and affect appeared appropriate.          Lab Results: Lab Results  Component Value Date   WBC 9.0 09/03/2019   HGB 13.3 09/03/2019   HCT 39.8 09/03/2019   MCV 88.2 09/03/2019   PLT 291 09/03/2019     Chemistry      Component Value Date/Time   NA 139 08/27/2015 1411   K 3.9 08/27/2015 1411   CL 99 07/01/2013 1115   CO2 28 08/27/2015 1411   BUN 11.5 08/27/2015 1411   CREATININE 0.9 08/27/2015 1411      Component Value Date/Time   CALCIUM 9.7 08/27/2015 1411   ALKPHOS 100 08/27/2015 1411   AST 17 08/27/2015 1411   ALT 18 08/27/2015 1411   BILITOT 0.79 08/27/2015 1411     Iron/TIBC/Ferritin/ %Sat    Component Value Date/Time   IRON 82 01/17/2019 1136   IRON 72 03/14/2017 1510   TIBC 285  01/17/2019 1136   TIBC 263 03/14/2017 1510   FERRITIN 235 01/17/2019 1136   FERRITIN 321 (H) 03/14/2017 1510   IRONPCTSAT 29 01/17/2019 1136   IRONPCTSAT 28 03/14/2017 1510    Impression and Plan:  34 year old woman with:  1. Heterozygous factor V Leiden resulting in a pulmonary embolism in 2012.  He is chronically anticoagulated on Xarelto which would be a lifetime anticoagulation.    Risks and benefits of continuing this approach long-term was reviewed.  Alternative options would be Lovenox case of pregnancy as well as discontinuation of anticoagulation.  Given her high risk of recurrence that has been observed in the past, we have elected to continue with Xarelto at this time.  She will resume Xarelto once her heavy menstrual bleeding has stopped.    2.  Iron deficiency anemia related to recurrent menstrual blood losses.  Iron studies obtained in June 2020 were within normal range with normal hemoglobin.  CBC reviewed today showed a hemoglobin of 13.3 with iron studies are currently pending.  She is agreeable to receive Feraheme if needed.    3. Irregular menstrual cycles: She has follow-up with gynecology regarding this issue.   4. Follow-up: Will be in 4 months for repeat evaluation.  20  minutes was spent on this encounter.  The time was dedicated to reviewing laboratory data, treatment options and coordinating plan of care.   Zola Button, MD 09/03/19

## 2019-09-04 ENCOUNTER — Telehealth: Payer: Self-pay | Admitting: Oncology

## 2019-09-04 ENCOUNTER — Telehealth: Payer: Self-pay

## 2019-09-04 LAB — FERRITIN: Ferritin: 148 ng/mL (ref 11–307)

## 2019-09-04 LAB — IRON AND TIBC
Iron: 46 ug/dL (ref 41–142)
Saturation Ratios: 17 % — ABNORMAL LOW (ref 21–57)
TIBC: 273 ug/dL (ref 236–444)
UIBC: 227 ug/dL (ref 120–384)

## 2019-09-04 NOTE — Telephone Encounter (Signed)
Scheduled appt per 1/27 los.  Sent a message to HIM pool to have a calendar mailed out. 

## 2019-09-04 NOTE — Telephone Encounter (Signed)
-----   Message from Wyatt Portela, MD sent at 09/04/2019  9:16 AM EST ----- Please let her know her iron and ferritin are normal.

## 2019-09-04 NOTE — Telephone Encounter (Signed)
Called patient and let her know that per Dr. Alen Blew iron and ferritin levels are normal. Patient verbalized understanding.

## 2019-12-31 ENCOUNTER — Other Ambulatory Visit: Payer: Self-pay

## 2019-12-31 DIAGNOSIS — D500 Iron deficiency anemia secondary to blood loss (chronic): Secondary | ICD-10-CM

## 2020-01-01 ENCOUNTER — Other Ambulatory Visit: Payer: Self-pay

## 2020-01-01 ENCOUNTER — Inpatient Hospital Stay: Payer: BLUE CROSS/BLUE SHIELD

## 2020-01-01 ENCOUNTER — Inpatient Hospital Stay: Payer: BLUE CROSS/BLUE SHIELD | Attending: Oncology | Admitting: Oncology

## 2020-01-01 VITALS — BP 128/76 | HR 82 | Temp 97.7°F | Ht 66.0 in | Wt 271.8 lb

## 2020-01-01 DIAGNOSIS — N920 Excessive and frequent menstruation with regular cycle: Secondary | ICD-10-CM | POA: Insufficient documentation

## 2020-01-01 DIAGNOSIS — D6851 Activated protein C resistance: Secondary | ICD-10-CM | POA: Diagnosis not present

## 2020-01-01 DIAGNOSIS — D500 Iron deficiency anemia secondary to blood loss (chronic): Secondary | ICD-10-CM

## 2020-01-01 DIAGNOSIS — Z7901 Long term (current) use of anticoagulants: Secondary | ICD-10-CM | POA: Insufficient documentation

## 2020-01-01 DIAGNOSIS — Z86711 Personal history of pulmonary embolism: Secondary | ICD-10-CM | POA: Insufficient documentation

## 2020-01-01 LAB — CBC WITH DIFFERENTIAL (CANCER CENTER ONLY)
Abs Immature Granulocytes: 0.02 10*3/uL (ref 0.00–0.07)
Basophils Absolute: 0 10*3/uL (ref 0.0–0.1)
Basophils Relative: 1 %
Eosinophils Absolute: 0.1 10*3/uL (ref 0.0–0.5)
Eosinophils Relative: 1 %
HCT: 38.8 % (ref 36.0–46.0)
Hemoglobin: 12.8 g/dL (ref 12.0–15.0)
Immature Granulocytes: 0 %
Lymphocytes Relative: 21 %
Lymphs Abs: 1.6 10*3/uL (ref 0.7–4.0)
MCH: 28.9 pg (ref 26.0–34.0)
MCHC: 33 g/dL (ref 30.0–36.0)
MCV: 87.6 fL (ref 80.0–100.0)
Monocytes Absolute: 0.5 10*3/uL (ref 0.1–1.0)
Monocytes Relative: 7 %
Neutro Abs: 5.6 10*3/uL (ref 1.7–7.7)
Neutrophils Relative %: 70 %
Platelet Count: 282 10*3/uL (ref 150–400)
RBC: 4.43 MIL/uL (ref 3.87–5.11)
RDW: 13 % (ref 11.5–15.5)
WBC Count: 7.8 10*3/uL (ref 4.0–10.5)
nRBC: 0 % (ref 0.0–0.2)

## 2020-01-01 NOTE — Progress Notes (Signed)
Hematology and Oncology Follow Up Visit  Robin Key [id number] April 18, 1986 34 y.o. 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    Secondary diagnosis: Iron deficiency anemia due to chronic menstrual blood losses and chronic anticoagulation since 2012.  Prior Therapy:  Full dose anticoagulation with Lovenox after she had a C-section on 07/01/2013.   She is status post IV iron in the form of Feraheme with the last treatment given in March 2020.  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 She was switched to Lovenox in June 2018.  Therapy discontinued in March 2019.  Current therapy:   Xarelto 20 mg daily started in June 2019.   Interim History:  Robin Key returns today for a repeat evaluation.  Since the last visit, she reports no major changes in her health.  She is experiencing more menstrual bleeding with heavier clots noted in the last 2 weeks.  She is currently on Depo-Provera at this point that she is experienced this of breakthrough bleeding.  She is currently on Xarelto without any other bleeding complications.  She does report some fatigue and some tiredness but no chest pain or palpitation.  No shortness of breath or difficulty breathing.      Medications: Unchanged on review. Current Outpatient Medications  Medication Sig Dispense Refill  . acyclovir (ZOVIRAX) 400 MG tablet     . butalbital-acetaminophen-caffeine (FIORICET, ESGIC) 50-325-40 MG per tablet Take 2 tablets by mouth every 6 (six) hours as needed for headache. 30 tablet 1  . cetirizine (ZYRTEC) 10 MG tablet Take 10 mg by mouth daily.    . diclofenac (VOLTAREN) 75 MG EC tablet TAKE 1 TABLET(75 MG) BY MOUTH TWICE DAILY 30 tablet 0  . fluticasone (FLONASE) 50 MCG/ACT nasal  spray Place 2 sprays into both nostrils daily.    Marland Kitchen HYDROcodone-acetaminophen (NORCO) 7.5-325 MG tablet Take 1 tablet by mouth every 6 (six) hours as needed for moderate pain. 21 tablet 0  . meloxicam (MOBIC) 15 MG tablet Take 1 tablet (15 mg total) by mouth daily. 30 tablet 0  . ondansetron (ZOFRAN-ODT) 8 MG disintegrating tablet Take 1 tablet by mouth 3 (three) times daily as needed.  0  . pantoprazole (PROTONIX) 40 MG tablet Take by mouth.    . rivaroxaban (XARELTO) 20 MG TABS tablet TAKE 1 TABLET(20 MG) BY MOUTH DAILY WITH SUPPER 30 tablet 0  . SUMAtriptan (IMITREX) 50 MG tablet     . SYRINGE-NEEDLE, DISP, 3 ML (MONOJECT SAFETY SYRINGE/SHIELD) 25G X 5/8" 3 ML MISC 1,000 mcg by Misc.(Non-Drug; Combo Route) route every 30 days.    Marland Kitchen triamcinolone (KENALOG) 0.1 % paste Apply to mouth as needed.    . Vitamin D, Ergocalciferol, (DRISDOL) 1.25 MG (50000 UT) CAPS capsule      Current Facility-Administered Medications  Medication Dose Route Frequency Provider Last Rate Last Admin  . triamcinolone acetonide (KENALOG) 10 MG/ML injection 10 mg  10 mg Other Once [place], Titorya, DPM      . triamcinolone acetonide (KENALOG-40) injection 20 mg  20 mg Other Once [place], Titorya, DPM      . triamcinolone acetonide (KENALOG-40) injection 20 mg  20 mg Other Once [place], Titorya, DPM      . triamcinolone acetonide (KENALOG-40) injection 20 mg  20 mg Other Once Landis Martins, DPM      .  triamcinolone acetonide (KENALOG-40) injection 20 mg  20 mg Other Once Landis Martins, DPM         Allergies:  Allergies  Allergen Reactions  . Ciprofloxacin Other (See Comments)    Felt like "bugs crawling" when on Lovenox  . Neomycin-Bacitracin Zn-Polymyx Rash  . Shellfish Allergy Rash and Swelling    Shrimp   . Shellfish-Derived Products Swelling    Shrimp Shrimp  . Bactrim [Sulfamethoxazole-Trimethoprim] Other (See Comments)    headache Headache   . Oxycodone Nausea And Vomiting  . Percocet  [Oxycodone-Acetaminophen] Nausea And Vomiting  . Amoxicillin Rash  . Latex Rash  . Oxycodone-Acetaminophen Nausea And Vomiting  . Penicillins Rash  . Sulfa Antibiotics Rash  . Sulfasalazine Rash      Physical Exam:  Blood pressure 128/76, pulse 82, temperature 97.7 F (36.5 C), temperature source Temporal, height 5\' 6"  (1.676 m), weight 271 lb 12.8 oz (123.3 kg), SpO2 100 %, unknown if currently breastfeeding.     ECOG: 0     General appearance: Alert, awake without any distress. Head: Atraumatic without abnormalities Oropharynx: Without any thrush or ulcers. Eyes: No scleral icterus. Lymph nodes: No lymphadenopathy noted in the cervical, supraclavicular, or axillary nodes Heart:regular rate and rhythm, without any murmurs or gallops.   Lung: Clear to auscultation without any rhonchi, wheezes or dullness to percussion. Abdomin: Soft, nontender without any shifting dullness or ascites. Musculoskeletal: No clubbing or cyanosis. Neurological: No motor or sensory deficits. Skin: No rashes or lesions.            Lab Results: Lab Results  Component Value Date   WBC 9.0 09/03/2019   HGB 13.3 09/03/2019   HCT 39.8 09/03/2019   MCV 88.2 09/03/2019   PLT 291 09/03/2019     Chemistry      Component Value Date/Time   NA 139 08/27/2015 1411   K 3.9 08/27/2015 1411   CL 99 07/01/2013 1115   CO2 28 08/27/2015 1411   BUN 11.5 08/27/2015 1411   CREATININE 0.9 08/27/2015 1411      Component Value Date/Time   CALCIUM 9.7 08/27/2015 1411   ALKPHOS 100 08/27/2015 1411   AST 17 08/27/2015 1411   ALT 18 08/27/2015 1411   BILITOT 0.79 08/27/2015 1411     Iron/TIBC/Ferritin/ %Sat    Component Value Date/Time   IRON 46 09/03/2019 1506   IRON 72 03/14/2017 1510   TIBC 273 09/03/2019 1506   TIBC 263 03/14/2017 1510   FERRITIN 148 09/03/2019 1506   FERRITIN 321 (H) 03/14/2017 1510   IRONPCTSAT 17 (L) 09/03/2019 1506   IRONPCTSAT 28 03/14/2017 1510     Impression and Plan:  34 year old woman with:  1.  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    The natural course of this disease was reviewed and risks and benefits of continuing anticoagulation were discussed.  You have had recurrent thrombosis of anticoagulation and I recommended long-term therapy at this time.  Complication associated with this approach include increased bleeding predominantly menstrual blood losses and the possible need for IV iron infusion periodically.  He is agreeable to continue at this time.    2.  Iron deficiency anemia: Iron levels obtained in January 2021 appears adequate although slightly declining.  Iron deficiency is likely related to chronic menstrual blood losses exacerbated by anticoagulation.  Risks and benefits of intravenous iron were reviewed today.  She is agreeable to proceed and will await iron studies before  arranging infusion at this time.  Hemoglobin is adequate but appears to be declining.    3. Irregular menstrual cycles: She is considering hysterectomy and has follow-up with gynecology regarding this issue.  4.  Covid vaccine considerations: Risks and benefits were reviewed today given her history of factor V Leiden mutation and previous thrombosis.  I recommended proceeding with vaccination using Pfizer or Moderna and avoid [employer] given the still in low incidence of vaccination induced thrombocytopenia with thrombosis.  She will consider that option.   5. Follow-up: Will be in 4 months for repeat follow-up.  30  minutes were dedicated to this visit.  The time was spent on reviewing laboratory data, disease status update, discussing treatment options complication lytic therapy.  Zola Button, MD 01/01/20

## 2020-01-02 ENCOUNTER — Telehealth: Payer: Self-pay | Admitting: Oncology

## 2020-01-02 ENCOUNTER — Telehealth: Payer: Self-pay

## 2020-01-02 LAB — IRON AND TIBC
Iron: 56 ug/dL (ref 41–142)
Saturation Ratios: 19 % — ABNORMAL LOW (ref 21–57)
TIBC: 295 ug/dL (ref 236–444)
UIBC: 239 ug/dL (ref 120–384)

## 2020-01-02 LAB — FERRITIN: Ferritin: 101 ng/mL (ref 11–307)

## 2020-01-02 NOTE — Telephone Encounter (Signed)
TC to Pt. Relayed  Dr. Hazeline Junker message in reference to Pt.'s iron results being good and no IV iron for now. Pt. Verbalized understanding. No further problems or concerns noted.

## 2020-01-02 NOTE — Telephone Encounter (Signed)
-----   Message from Wyatt Portela, MD sent at 01/02/2020  9:02 AM EDT ----- Please let her know her iron still good. No IV iron for now.

## 2020-01-02 NOTE — Telephone Encounter (Signed)
Scheduled appt per 5/27.  Spoke with pt and they are aware of the appt date and time.

## 2020-01-06 ENCOUNTER — Telehealth: Payer: Self-pay

## 2020-01-06 ENCOUNTER — Telehealth: Payer: Self-pay | Admitting: Oncology

## 2020-01-06 NOTE — Telephone Encounter (Signed)
Scheduled appts per 6/1 sch msg. Pt confirmed appt date and time.  

## 2020-01-06 NOTE — Telephone Encounter (Signed)
TC from Pt. stating that she understood Dr. Hazeline Junker message about not needing IV iron at this time. Pt. Stated that she is still bleeding and is concerned that she will need a blood transfusion. Went over labs with Pt, and informed her that I will relay the message for Dr. Alen Blew to give her a call. In basket message sent to Dr. Alen Blew

## 2020-01-30 ENCOUNTER — Other Ambulatory Visit: Payer: Self-pay

## 2020-01-30 ENCOUNTER — Other Ambulatory Visit: Payer: Self-pay | Admitting: Oncology

## 2020-01-30 ENCOUNTER — Telehealth: Payer: Self-pay

## 2020-01-30 ENCOUNTER — Other Ambulatory Visit: Payer: BLUE CROSS/BLUE SHIELD

## 2020-01-30 ENCOUNTER — Inpatient Hospital Stay: Payer: BLUE CROSS/BLUE SHIELD | Attending: Oncology

## 2020-01-30 DIAGNOSIS — D6851 Activated protein C resistance: Secondary | ICD-10-CM | POA: Insufficient documentation

## 2020-01-30 DIAGNOSIS — N920 Excessive and frequent menstruation with regular cycle: Secondary | ICD-10-CM | POA: Insufficient documentation

## 2020-01-30 DIAGNOSIS — D500 Iron deficiency anemia secondary to blood loss (chronic): Secondary | ICD-10-CM | POA: Diagnosis present

## 2020-01-30 LAB — CBC WITH DIFFERENTIAL (CANCER CENTER ONLY)
Abs Immature Granulocytes: 0.02 10*3/uL (ref 0.00–0.07)
Basophils Absolute: 0.1 10*3/uL (ref 0.0–0.1)
Basophils Relative: 1 %
Eosinophils Absolute: 0.1 10*3/uL (ref 0.0–0.5)
Eosinophils Relative: 1 %
HCT: 40.2 % (ref 36.0–46.0)
Hemoglobin: 13.1 g/dL (ref 12.0–15.0)
Immature Granulocytes: 0 %
Lymphocytes Relative: 15 %
Lymphs Abs: 1.4 10*3/uL (ref 0.7–4.0)
MCH: 29 pg (ref 26.0–34.0)
MCHC: 32.6 g/dL (ref 30.0–36.0)
MCV: 88.9 fL (ref 80.0–100.0)
Monocytes Absolute: 0.4 10*3/uL (ref 0.1–1.0)
Monocytes Relative: 4 %
Neutro Abs: 7 10*3/uL (ref 1.7–7.7)
Neutrophils Relative %: 79 %
Platelet Count: 275 10*3/uL (ref 150–400)
RBC: 4.52 MIL/uL (ref 3.87–5.11)
RDW: 12.9 % (ref 11.5–15.5)
WBC Count: 8.9 10*3/uL (ref 4.0–10.5)
nRBC: 0 % (ref 0.0–0.2)

## 2020-01-30 LAB — IRON AND TIBC
Iron: 25 ug/dL — ABNORMAL LOW (ref 41–142)
Saturation Ratios: 8 % — ABNORMAL LOW (ref 21–57)
TIBC: 298 ug/dL (ref 236–444)
UIBC: 272 ug/dL (ref 120–384)

## 2020-01-30 LAB — FERRITIN: Ferritin: 42 ng/mL (ref 11–307)

## 2020-01-30 NOTE — Telephone Encounter (Signed)
-----   Message from Wyatt Portela, MD sent at 01/30/2020  1:31 PM EDT ----- Please let her know her iron dropped from last month. I will set up Iron infusion in the next week or so. I will send a message to scheduling.

## 2020-01-30 NOTE — Telephone Encounter (Signed)
Called patient and let her know Dr. Hazeline Junker instructions and to expect a call from scheduling to set up an iron infusion. Patient verbalized understanding.

## 2020-02-03 ENCOUNTER — Inpatient Hospital Stay: Payer: BLUE CROSS/BLUE SHIELD

## 2020-02-03 ENCOUNTER — Other Ambulatory Visit: Payer: Self-pay

## 2020-02-03 VITALS — BP 113/64 | HR 73 | Temp 98.4°F | Resp 18

## 2020-02-03 DIAGNOSIS — D500 Iron deficiency anemia secondary to blood loss (chronic): Secondary | ICD-10-CM

## 2020-02-03 DIAGNOSIS — D6851 Activated protein C resistance: Secondary | ICD-10-CM | POA: Diagnosis not present

## 2020-02-03 MED ORDER — SODIUM CHLORIDE 0.9 % IV SOLN
510.0000 mg | Freq: Once | INTRAVENOUS | Status: AC
  Administered 2020-02-03: 510 mg via INTRAVENOUS
  Filled 2020-02-03: qty 17

## 2020-02-03 MED ORDER — SODIUM CHLORIDE 0.9 % IV SOLN
Freq: Once | INTRAVENOUS | Status: AC
  Filled 2020-02-03: qty 250

## 2020-02-03 NOTE — Patient Instructions (Signed)

## 2020-02-03 NOTE — Progress Notes (Signed)
Patient states she took 50 mg benadryl at home prior to arrival.   Patient declined to stay for the 30 minute post observation period. VSS upon leaving unit.

## 2020-02-10 ENCOUNTER — Other Ambulatory Visit: Payer: Self-pay

## 2020-02-10 ENCOUNTER — Inpatient Hospital Stay: Payer: BLUE CROSS/BLUE SHIELD | Attending: Oncology

## 2020-02-10 VITALS — BP 113/72 | HR 69 | Temp 97.8°F | Resp 18

## 2020-02-10 DIAGNOSIS — N920 Excessive and frequent menstruation with regular cycle: Secondary | ICD-10-CM | POA: Insufficient documentation

## 2020-02-10 DIAGNOSIS — D500 Iron deficiency anemia secondary to blood loss (chronic): Secondary | ICD-10-CM | POA: Diagnosis present

## 2020-02-10 MED ORDER — SODIUM CHLORIDE 0.9 % IV SOLN
510.0000 mg | Freq: Once | INTRAVENOUS | Status: AC
  Administered 2020-02-10: 510 mg via INTRAVENOUS
  Filled 2020-02-10: qty 17

## 2020-02-10 MED ORDER — SODIUM CHLORIDE 0.9 % IV SOLN
Freq: Once | INTRAVENOUS | Status: AC
  Filled 2020-02-10: qty 250

## 2020-02-10 NOTE — Progress Notes (Signed)
Patient took 50mg  Benadryl at home at 13:30.

## 2020-05-05 ENCOUNTER — Other Ambulatory Visit: Payer: Self-pay

## 2020-05-05 ENCOUNTER — Inpatient Hospital Stay: Payer: BLUE CROSS/BLUE SHIELD

## 2020-05-05 ENCOUNTER — Inpatient Hospital Stay: Payer: BLUE CROSS/BLUE SHIELD | Attending: Oncology | Admitting: Oncology

## 2020-05-05 VITALS — BP 126/82 | HR 92 | Temp 97.7°F | Resp 18 | Ht 66.0 in | Wt 256.8 lb

## 2020-05-05 DIAGNOSIS — N926 Irregular menstruation, unspecified: Secondary | ICD-10-CM | POA: Insufficient documentation

## 2020-05-05 DIAGNOSIS — D509 Iron deficiency anemia, unspecified: Secondary | ICD-10-CM | POA: Insufficient documentation

## 2020-05-05 DIAGNOSIS — D500 Iron deficiency anemia secondary to blood loss (chronic): Secondary | ICD-10-CM | POA: Diagnosis not present

## 2020-05-05 DIAGNOSIS — Z7901 Long term (current) use of anticoagulants: Secondary | ICD-10-CM | POA: Insufficient documentation

## 2020-05-05 DIAGNOSIS — D6851 Activated protein C resistance: Secondary | ICD-10-CM | POA: Diagnosis not present

## 2020-05-05 DIAGNOSIS — I8290 Acute embolism and thrombosis of unspecified vein: Secondary | ICD-10-CM | POA: Insufficient documentation

## 2020-05-05 LAB — CBC WITH DIFFERENTIAL (CANCER CENTER ONLY)
Abs Immature Granulocytes: 0.03 10*3/uL (ref 0.00–0.07)
Basophils Absolute: 0.1 10*3/uL (ref 0.0–0.1)
Basophils Relative: 0 %
Eosinophils Absolute: 0.1 10*3/uL (ref 0.0–0.5)
Eosinophils Relative: 1 %
HCT: 40.8 % (ref 36.0–46.0)
Hemoglobin: 13.8 g/dL (ref 12.0–15.0)
Immature Granulocytes: 0 %
Lymphocytes Relative: 13 %
Lymphs Abs: 1.5 10*3/uL (ref 0.7–4.0)
MCH: 29.4 pg (ref 26.0–34.0)
MCHC: 33.8 g/dL (ref 30.0–36.0)
MCV: 86.8 fL (ref 80.0–100.0)
Monocytes Absolute: 0.8 10*3/uL (ref 0.1–1.0)
Monocytes Relative: 7 %
Neutro Abs: 8.7 10*3/uL — ABNORMAL HIGH (ref 1.7–7.7)
Neutrophils Relative %: 79 %
Platelet Count: 277 10*3/uL (ref 150–400)
RBC: 4.7 MIL/uL (ref 3.87–5.11)
RDW: 13.2 % (ref 11.5–15.5)
WBC Count: 11.2 10*3/uL — ABNORMAL HIGH (ref 4.0–10.5)
nRBC: 0 % (ref 0.0–0.2)

## 2020-05-05 NOTE — Progress Notes (Signed)
Hematology and Oncology Follow Up Visit  Robin Key [id number] [birth year] 34 y.o. 05/05/2020 3:22 PM [name], Robin Key, [name], Robin Key*   Principle Diagnosis: 34 year old woman with heterozygous factor V Leiden mutation and recurrent DVT diagnosed in 2012.    Secondary diagnosis: Iron deficiency anemia due to chronic menstrual blood losses and chronic anticoagulation since 2012.  Prior Therapy:  Full dose anticoagulation with Lovenox after she had a C-section on 07/01/2013.   She is status post IV iron in the form of Feraheme with the last treatment given in March 2020.  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 She was switched to Lovenox in June 2018.  Therapy discontinued in March 2019.  Current therapy:   Xarelto 20 mg daily started in June 2019.   Interim History:  Robin Key is here for repeat evaluation.  Since last visit, she reports no major changes in her health.  She has not had any complications related to Xarelto.  She denies any hematochezia or melena.  Her menstrual cycles have been controlled with Provera.  He denies any other illnesses or hospitalizations.      Medications: Reviewed without changes. Current Outpatient Medications  Medication Sig Dispense Refill  . acyclovir (ZOVIRAX) 400 MG tablet     . butalbital-acetaminophen-caffeine (FIORICET, ESGIC) 50-325-40 MG per tablet Take 2 tablets by mouth every 6 (six) hours as needed for headache. 30 tablet 1  . cetirizine (ZYRTEC) 10 MG tablet Take 10 mg by mouth daily.    . diclofenac (VOLTAREN) 75 MG EC tablet TAKE 1 TABLET(75 MG) BY MOUTH TWICE DAILY 30 tablet 0  . fluticasone (FLONASE) 50 MCG/ACT nasal spray Place 2 sprays into both nostrils daily.    Marland Kitchen HYDROcodone-acetaminophen (NORCO) 7.5-325 MG tablet Take 1 tablet by mouth every 6 (six) hours as needed for moderate pain. 21 tablet 0  . meloxicam (MOBIC) 15 MG tablet Take 1  tablet (15 mg total) by mouth daily. 30 tablet 0  . ondansetron (ZOFRAN-ODT) 8 MG disintegrating tablet Take 1 tablet by mouth 3 (three) times daily as needed.  0  . pantoprazole (PROTONIX) 40 MG tablet Take by mouth.    . rivaroxaban (XARELTO) 20 MG TABS tablet TAKE 1 TABLET(20 MG) BY MOUTH DAILY WITH SUPPER 30 tablet 0  . SUMAtriptan (IMITREX) 50 MG tablet     . SYRINGE-NEEDLE, DISP, 3 ML (MONOJECT SAFETY SYRINGE/SHIELD) 25G X 5/8" 3 ML MISC 1,000 mcg by Misc.(Non-Drug; Combo Route) route every 30 days.    Marland Kitchen triamcinolone (KENALOG) 0.1 % paste Apply to mouth as needed.    . Vitamin D, Ergocalciferol, (DRISDOL) 1.25 MG (50000 UT) CAPS capsule      Current Facility-Administered Medications  Medication Dose Route Frequency Provider Last Rate Last Admin  . triamcinolone acetonide (KENALOG) 10 MG/ML injection 10 mg  10 mg Other Once [place], Titorya, DPM      . triamcinolone acetonide (KENALOG-40) injection 20 mg  20 mg Other Once [place], Titorya, DPM      . triamcinolone acetonide (KENALOG-40) injection 20 mg  20 mg Other Once [place], Titorya, DPM      . triamcinolone acetonide (KENALOG-40) injection 20 mg  20 mg Other Once [place], Titorya, DPM      . triamcinolone acetonide (KENALOG-40) injection 20 mg  20 mg Other Once Landis Martins, DPM         Allergies:  Allergies  Allergen Reactions  . Ciprofloxacin Other (See Comments)    Felt  like "bugs crawling" when on Lovenox  . Neomycin-Bacitracin Zn-Polymyx Rash  . Shellfish Allergy Rash and Swelling    Shrimp   . Shellfish-Derived Products Swelling    Shrimp Shrimp  . Bactrim [Sulfamethoxazole-Trimethoprim] Other (See Comments)    headache Headache   . Oxycodone Nausea And Vomiting  . Percocet [Oxycodone-Acetaminophen] Nausea And Vomiting  . Amoxicillin Rash  . Latex Rash  . Oxycodone-Acetaminophen Nausea And Vomiting  . Penicillins Rash  . Sulfa Antibiotics Rash  . Sulfasalazine Rash      Physical Exam:   Blood pressure  126/82, pulse 92, temperature 97.7 F (36.5 C), temperature source Tympanic, resp. rate 18, height 5\' 6"  (1.676 m), weight 256 lb 12.8 oz (116.5 kg), SpO2 99 %, unknown if currently breastfeeding.     ECOG: 0   General appearance: Comfortable appearing without any discomfort Head: Normocephalic without any trauma Oropharynx: Mucous membranes are moist and pink without any thrush or ulcers. Eyes: Pupils are equal and round reactive to light. Lymph nodes: No cervical, supraclavicular, inguinal or axillary lymphadenopathy.   Heart:regular rate and rhythm.  S1 and S2 without leg edema. Lung: Clear without any rhonchi or wheezes.  No dullness to percussion. Abdomin: Soft, nontender, nondistended with good bowel sounds.  No hepatosplenomegaly. Musculoskeletal: No joint deformity or effusion.  Full range of motion noted. Neurological: No deficits noted on motor, sensory and deep tendon reflex exam. Skin: No petechial rash or dryness.  Appeared moist.              Lab Results: Lab Results  Component Value Date   WBC 8.9 01/30/2020   HGB 13.1 01/30/2020   HCT 40.2 01/30/2020   MCV 88.9 01/30/2020   PLT 275 01/30/2020     Chemistry      Component Value Date/Time   NA 139 08/27/2015 1411   K 3.9 08/27/2015 1411   CL 99 07/01/2013 1115   CO2 28 08/27/2015 1411   BUN 11.5 08/27/2015 1411   CREATININE 0.9 08/27/2015 1411      Component Value Date/Time   CALCIUM 9.7 08/27/2015 1411   ALKPHOS 100 08/27/2015 1411   AST 17 08/27/2015 1411   ALT 18 08/27/2015 1411   BILITOT 0.79 08/27/2015 1411     Iron/TIBC/Ferritin/ %Sat    Component Value Date/Time   IRON 25 (L) 01/30/2020 1238   IRON 72 03/14/2017 1510   TIBC 298 01/30/2020 1238   TIBC 263 03/14/2017 1510   FERRITIN 42 01/30/2020 1238   FERRITIN 321 (H) 03/14/2017 1510   IRONPCTSAT 8 (L) 01/30/2020 1238   IRONPCTSAT 28 03/14/2017 1510    Impression and Plan:  34 year old woman with:  1.  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    She has been on long-term anticoagulation with Xarelto without any recent thrombosis or excessive bleeding.  Asks and benefits of continuing long-term anticoagulation reviewed.  Alternative options were discussed at this time.  She is agreeable to continue at this time.    2.  Iron deficiency anemia: Iron studies in June 2021 showed iron level of 25 and ferritin of 42.  She received intravenous iron in July 2021    3. Irregular menstrual cycles: She continues to follow with gynecology regarding this issue and overall her cycles are controlled by Provera.  4.  Covid vaccine considerations: Risks and benefits of vaccination were reviewed today in detail she is still unvaccinated.  I urged her to do so.  She will consider  it but for the most part she is unconvinced to receive it.  I assured her the risk of thrombosis associated with mRNA vaccines are very low.   5. Follow-up: In 4 months for repeat visit.  30  minutes were spent on this encounter.  Time was dedicated to reviewing her disease status, reviewing laboratory data and addressing complications related to her condition and future plan of care review.  Zola Button, MD 05/05/20

## 2020-05-06 ENCOUNTER — Telehealth: Payer: Self-pay | Admitting: Oncology

## 2020-05-06 ENCOUNTER — Telehealth: Payer: Self-pay

## 2020-05-06 LAB — IRON AND TIBC
Iron: 40 ug/dL — ABNORMAL LOW (ref 41–142)
Saturation Ratios: 16 % — ABNORMAL LOW (ref 21–57)
TIBC: 256 ug/dL (ref 236–444)
UIBC: 216 ug/dL (ref 120–384)

## 2020-05-06 LAB — FERRITIN: Ferritin: 389 ng/mL — ABNORMAL HIGH (ref 11–307)

## 2020-05-06 NOTE — Telephone Encounter (Signed)
Called patient and made her aware of Dr. Hazeline Junker message below. Patient verbalized understanding and agrees to receive IV iron. Patient is aware to expect a call from scheduling.

## 2020-05-06 NOTE — Telephone Encounter (Signed)
-----   Message from Wyatt Portela, MD sent at 05/06/2020  8:54 AM EDT ----- Please let her know her iron is better but still low.  She would benefit from repeat IV iron if she is willing.  I will send a message to scheduling.

## 2020-05-06 NOTE — Addendum Note (Signed)
Addended by: Wyatt Portela on: 05/06/2020 08:55 AM   Modules accepted: Orders

## 2020-05-06 NOTE — Telephone Encounter (Signed)
Scheduled appt per 9/30 sch msg - pt aware of appt date and time

## 2020-05-11 ENCOUNTER — Telehealth: Payer: Self-pay

## 2020-05-11 ENCOUNTER — Other Ambulatory Visit: Payer: Self-pay | Admitting: Oncology

## 2020-05-11 NOTE — Telephone Encounter (Signed)
-----   Message from Wyatt Portela, MD sent at 05/11/2020  8:13 AM EDT ----- Regarding: FW: Feraheme Please let the patient know we had to change her to Ferrlecit. This means two more infusion appointments. Message sent to scheduling.  Thanks ----- Message ----- From: Blenda Peals Sent: 05/10/2020   2:26 PM EDT To: Wyatt Portela, MD, [facility] Subject: Feraheme                                       Dr. Alen Blew,  Effective 05/07/2020-Feraheme is non preferred with [employer]. Venofer and Ferrlecit are the preferred. Can this be changed to one of the preferred?   Patient is scheduled on 05/14/2020.

## 2020-05-11 NOTE — Telephone Encounter (Signed)
Called patient and made her aware of message below and to expect a call from the scheduling department to add on two more infusion appointments. Patient verbalized understanding.

## 2020-05-14 ENCOUNTER — Other Ambulatory Visit: Payer: Self-pay

## 2020-05-14 ENCOUNTER — Inpatient Hospital Stay: Payer: BLUE CROSS/BLUE SHIELD | Attending: Oncology

## 2020-05-14 VITALS — BP 101/67 | HR 61 | Temp 98.1°F | Resp 18

## 2020-05-14 DIAGNOSIS — D509 Iron deficiency anemia, unspecified: Secondary | ICD-10-CM | POA: Insufficient documentation

## 2020-05-14 DIAGNOSIS — D500 Iron deficiency anemia secondary to blood loss (chronic): Secondary | ICD-10-CM

## 2020-05-14 MED ORDER — SODIUM CHLORIDE 0.9 % IV SOLN
125.0000 mg | Freq: Once | INTRAVENOUS | Status: AC
  Administered 2020-05-14: 125 mg via INTRAVENOUS
  Filled 2020-05-14: qty 10

## 2020-05-14 MED ORDER — SODIUM CHLORIDE 0.9 % IV SOLN
Freq: Once | INTRAVENOUS | Status: AC
  Filled 2020-05-14: qty 250

## 2020-05-14 NOTE — Patient Instructions (Signed)

## 2020-05-14 NOTE — Progress Notes (Signed)
Pt took 50 mg Benadryl at home at 1500

## 2020-05-21 ENCOUNTER — Other Ambulatory Visit: Payer: Self-pay

## 2020-05-21 ENCOUNTER — Inpatient Hospital Stay: Payer: BLUE CROSS/BLUE SHIELD

## 2020-05-21 VITALS — BP 110/67 | HR 63 | Temp 98.0°F | Resp 18

## 2020-05-21 DIAGNOSIS — D500 Iron deficiency anemia secondary to blood loss (chronic): Secondary | ICD-10-CM

## 2020-05-21 DIAGNOSIS — D509 Iron deficiency anemia, unspecified: Secondary | ICD-10-CM | POA: Diagnosis not present

## 2020-05-21 MED ORDER — SODIUM CHLORIDE 0.9 % IV SOLN
125.0000 mg | Freq: Once | INTRAVENOUS | Status: AC
  Administered 2020-05-21: 125 mg via INTRAVENOUS
  Filled 2020-05-21: qty 10

## 2020-05-21 MED ORDER — SODIUM CHLORIDE 0.9 % IV SOLN
Freq: Once | INTRAVENOUS | Status: AC
  Filled 2020-05-21: qty 250

## 2020-05-21 NOTE — Progress Notes (Signed)
Pt declined to stay for 30 min post infusion observation period.  Tolerated well.  VS stable.

## 2020-05-21 NOTE — Patient Instructions (Signed)

## 2020-05-21 NOTE — Progress Notes (Signed)
Patient stated she took 50mg  of Benadryl at home

## 2020-05-28 ENCOUNTER — Other Ambulatory Visit: Payer: Self-pay

## 2020-05-28 ENCOUNTER — Inpatient Hospital Stay: Payer: BLUE CROSS/BLUE SHIELD

## 2020-05-28 VITALS — BP 111/72 | HR 64 | Temp 98.1°F | Resp 16

## 2020-05-28 DIAGNOSIS — D509 Iron deficiency anemia, unspecified: Secondary | ICD-10-CM | POA: Diagnosis not present

## 2020-05-28 DIAGNOSIS — D500 Iron deficiency anemia secondary to blood loss (chronic): Secondary | ICD-10-CM

## 2020-05-28 MED ORDER — SODIUM CHLORIDE 0.9 % IV SOLN
125.0000 mg | Freq: Once | INTRAVENOUS | Status: AC
  Administered 2020-05-28: 125 mg via INTRAVENOUS
  Filled 2020-05-28: qty 10

## 2020-05-28 MED ORDER — SODIUM CHLORIDE 0.9 % IV SOLN
Freq: Once | INTRAVENOUS | Status: AC
  Filled 2020-05-28: qty 250

## 2020-05-28 NOTE — Patient Instructions (Signed)

## 2020-06-01 ENCOUNTER — Telehealth: Payer: Self-pay | Admitting: Oncology

## 2020-06-01 NOTE — Telephone Encounter (Signed)
Called regarding scheduled message, called patient but was unable to leave a voicemail.

## 2020-06-04 ENCOUNTER — Ambulatory Visit: Payer: BLUE CROSS/BLUE SHIELD

## 2020-06-10 ENCOUNTER — Encounter: Payer: Self-pay | Admitting: Oncology

## 2020-06-10 ENCOUNTER — Telehealth: Payer: Self-pay | Admitting: Oncology

## 2020-06-10 ENCOUNTER — Telehealth: Payer: Self-pay

## 2020-06-10 NOTE — Telephone Encounter (Signed)
Rescheduled cancelled 10/29 cancelled appointment, patient is notified of upcoming appointment.

## 2020-06-10 NOTE — Telephone Encounter (Signed)
-----   Message from Wyatt Portela, MD sent at 06/10/2020  4:12 PM EDT ----- Deputy. Thanks ----- Message ----- From: Kennedy Bucker, LPN Sent: 89/03/4209   3:54 PM EDT To: Wyatt Portela, MD  Patient was trying to call scheduling to rescheduled Iron infusion appointment that she canceled. Went to scheduling and they are working on getting her rescheduled.   Kim LPN

## 2020-06-15 ENCOUNTER — Other Ambulatory Visit: Payer: Self-pay

## 2020-06-15 ENCOUNTER — Inpatient Hospital Stay: Payer: BLUE CROSS/BLUE SHIELD | Attending: Oncology

## 2020-06-15 VITALS — BP 111/60 | HR 66 | Temp 98.9°F | Resp 16

## 2020-06-15 DIAGNOSIS — D509 Iron deficiency anemia, unspecified: Secondary | ICD-10-CM | POA: Diagnosis not present

## 2020-06-15 DIAGNOSIS — D500 Iron deficiency anemia secondary to blood loss (chronic): Secondary | ICD-10-CM

## 2020-06-15 MED ORDER — SODIUM CHLORIDE 0.9 % IV SOLN
Freq: Once | INTRAVENOUS | Status: AC
  Filled 2020-06-15: qty 250

## 2020-06-15 MED ORDER — SODIUM CHLORIDE 0.9 % IV SOLN
125.0000 mg | Freq: Once | INTRAVENOUS | Status: AC
  Administered 2020-06-15: 125 mg via INTRAVENOUS
  Filled 2020-06-15: qty 10

## 2020-06-15 NOTE — Progress Notes (Signed)
Pt declined to remain for 30 min post infusion observation period.  VS stable.  Tolerated infusion well.  Denies any questions/concerns at time of d/c.

## 2020-06-15 NOTE — Patient Instructions (Signed)
Sodium Ferric Gluconate Complex injection What is this medicine? SODIUM FERRIC GLUCONATE COMPLEX (SOE dee um FER ik GLOO koe nate KOM pleks) is an iron replacement. It is used with epoetin therapy to treat low iron levels in patients who are receiving hemodialysis. This medicine may be used for other purposes; ask your health care provider or pharmacist if you have questions. COMMON BRAND NAME(S): Ferrlecit, Nulecit What should I tell my health care provider before I take this medicine? They need to know if you have any of the following conditions:  anemia that is not from iron deficiency  high levels of iron in the body  an unusual or allergic reaction to iron, benzyl alcohol, other medicines, foods, dyes, or preservatives  pregnant or are trying to become pregnant  breast-feeding How should I use this medicine? This medicine is for infusion into a vein. It is given by a health care professional in a hospital or clinic setting. Talk to your pediatrician regarding the use of this medicine in children. While this drug may be prescribed for children as young as 6 years old for selected conditions, precautions do apply. Overdosage: If you think you have taken too much of this medicine contact a poison control center or emergency room at once. NOTE: This medicine is only for you. Do not share this medicine with others. What if I miss a dose? It is important not to miss your dose. Call your doctor or health care professional if you are unable to keep an appointment. What may interact with this medicine? Do not take this medicine with any of the following medications:  deferoxamine  dimercaprol  other iron products This medicine may also interact with the following medications:  chloramphenicol  deferasirox  medicine for blood pressure like enalapril This list may not describe all possible interactions. Give your health care provider a list of all the medicines, herbs,  non-prescription drugs, or dietary supplements you use. Also tell them if you smoke, drink alcohol, or use illegal drugs. Some items may interact with your medicine. What should I watch for while using this medicine? Your condition will be monitored carefully while you are receiving this medicine. Visit your doctor for check-ups as directed. What side effects may I notice from receiving this medicine? Side effects that you should report to your doctor or health care professional as soon as possible:  allergic reactions like skin rash, itching or hives, swelling of the face, lips, or tongue  breathing problems  changes in hearing  changes in vision  chills, flushing, or sweating  fast, irregular heartbeat  feeling faint or lightheaded, falls  fever, flu-like symptoms  high or low blood pressure  pain, tingling, numbness in the hands or feet  severe pain in the chest, back, flanks, or groin  swelling of the ankles, feet, hands  trouble passing urine or change in the amount of urine  unusually weak or tired Side effects that usually do not require medical attention (report to your doctor or health care professional if they continue or are bothersome):  cramps  dark colored stools  diarrhea  headache  nausea, vomiting  stomach upset This list may not describe all possible side effects. Call your doctor for medical advice about side effects. You may report side effects to FDA at 1-800-FDA-1088. Where should I keep my medicine? This drug is given in a hospital or clinic and will not be stored at home. NOTE: This sheet is a summary. It may not cover all   possible information. If you have questions about this medicine, talk to your doctor, pharmacist, or health care provider.  2020 Elsevier/Gold Standard (2008-03-25 15:58:57)  

## 2020-08-24 ENCOUNTER — Telehealth: Payer: Self-pay | Admitting: Oncology

## 2020-08-24 NOTE — Telephone Encounter (Signed)
Called patient regarding upcoming appointment, patient is notified. °

## 2020-09-01 ENCOUNTER — Other Ambulatory Visit: Payer: Self-pay

## 2020-09-01 ENCOUNTER — Inpatient Hospital Stay (HOSPITAL_BASED_OUTPATIENT_CLINIC_OR_DEPARTMENT_OTHER): Payer: BLUE CROSS/BLUE SHIELD | Admitting: Oncology

## 2020-09-01 ENCOUNTER — Inpatient Hospital Stay: Payer: BLUE CROSS/BLUE SHIELD | Attending: Oncology

## 2020-09-01 VITALS — BP 124/81 | HR 79 | Temp 97.8°F | Resp 18 | Ht 66.0 in | Wt 254.0 lb

## 2020-09-01 DIAGNOSIS — D500 Iron deficiency anemia secondary to blood loss (chronic): Secondary | ICD-10-CM | POA: Diagnosis present

## 2020-09-01 DIAGNOSIS — N920 Excessive and frequent menstruation with regular cycle: Secondary | ICD-10-CM | POA: Insufficient documentation

## 2020-09-01 DIAGNOSIS — D6851 Activated protein C resistance: Secondary | ICD-10-CM | POA: Insufficient documentation

## 2020-09-01 LAB — CBC WITH DIFFERENTIAL (CANCER CENTER ONLY)
Abs Immature Granulocytes: 0.01 10*3/uL (ref 0.00–0.07)
Basophils Absolute: 0 10*3/uL (ref 0.0–0.1)
Basophils Relative: 1 %
Eosinophils Absolute: 0.1 10*3/uL (ref 0.0–0.5)
Eosinophils Relative: 2 %
HCT: 42.2 % (ref 36.0–46.0)
Hemoglobin: 13.6 g/dL (ref 12.0–15.0)
Immature Granulocytes: 0 %
Lymphocytes Relative: 17 %
Lymphs Abs: 1.4 10*3/uL (ref 0.7–4.0)
MCH: 29.6 pg (ref 26.0–34.0)
MCHC: 32.2 g/dL (ref 30.0–36.0)
MCV: 91.7 fL (ref 80.0–100.0)
Monocytes Absolute: 0.6 10*3/uL (ref 0.1–1.0)
Monocytes Relative: 7 %
Neutro Abs: 6.2 10*3/uL (ref 1.7–7.7)
Neutrophils Relative %: 73 %
Platelet Count: 249 10*3/uL (ref 150–400)
RBC: 4.6 MIL/uL (ref 3.87–5.11)
RDW: 12.7 % (ref 11.5–15.5)
WBC Count: 8.4 10*3/uL (ref 4.0–10.5)
nRBC: 0 % (ref 0.0–0.2)

## 2020-09-01 LAB — IRON AND TIBC
Iron: 87 ug/dL (ref 41–142)
Saturation Ratios: 32 % (ref 21–57)
TIBC: 270 ug/dL (ref 236–444)
UIBC: 183 ug/dL (ref 120–384)

## 2020-09-01 LAB — FERRITIN: Ferritin: 253 ng/mL (ref 11–307)

## 2020-09-01 NOTE — Progress Notes (Signed)
Hematology and Oncology Follow Up Visit  Robin Key [id number] 03/30/1986 35 y.o. 09/01/2020 2:44 PM [name], [name], [name], Melissa Key*   Principle Diagnosis: 35 year old woman with iron deficiency anemia related to chronic menstrual blood losses diagnosed in 2012.    Secondary diagnosis: Recurrent venous thromboembolism diagnosed in 2012 due to heterozygous factor V Leiden mutation.    Prior Therapy:  Full dose anticoagulation with Lovenox after she had a C-section on 07/01/2013.   She is status post IV iron in the form of Feraheme and Nulecit.  Last infusion given in November 2021.  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 She was switched to Lovenox in June 2018.  Therapy discontinued in March 2019.  Current therapy:   Xarelto 20 mg daily started in June 2019.   Interim History:  Ms. Codd presents today for repeat evaluation.  Since the last visit, she received intravenous iron in the form of ferric gluconate completed in November 2021.  Since that time, she reports no major changes in her health.  She has tolerated the last IV iron infusion without any complications.  She has reported recently slight increase in her ice cravings.  She was diagnosed with COVID-19 infection in November and is fully recovered at this time.  She still has altered smell and taste however.  She denies any heavy menstrual cycles at this time with IUD in place.     Medications: Unchanged on review. Current Outpatient Medications  Medication Sig Dispense Refill  . acyclovir (ZOVIRAX) 400 MG tablet     . butalbital-acetaminophen-caffeine (FIORICET, ESGIC) 50-325-40 MG per tablet Take 2 tablets by mouth every 6 (six) hours as needed for headache. 30 tablet 1  . cetirizine (ZYRTEC) 10 MG tablet Take 10 mg by mouth daily.    . diclofenac (VOLTAREN) 75 MG EC tablet TAKE 1 TABLET(75 MG) BY MOUTH TWICE DAILY 30 tablet 0  .  fluticasone (FLONASE) 50 MCG/ACT nasal spray Place 2 sprays into both nostrils daily.    Marland Kitchen HYDROcodone-acetaminophen (NORCO) 7.5-325 MG tablet Take 1 tablet by mouth every 6 (six) hours as needed for moderate pain. 21 tablet 0  . meloxicam (MOBIC) 15 MG tablet Take 1 tablet (15 mg total) by mouth daily. 30 tablet 0  . ondansetron (ZOFRAN-ODT) 8 MG disintegrating tablet Take 1 tablet by mouth 3 (three) times daily as needed.  0  . pantoprazole (PROTONIX) 40 MG tablet Take by mouth.    . rivaroxaban (XARELTO) 20 MG TABS tablet TAKE 1 TABLET(20 MG) BY MOUTH DAILY WITH SUPPER 30 tablet 0  . SUMAtriptan (IMITREX) 50 MG tablet     . SYRINGE-NEEDLE, DISP, 3 ML (MONOJECT SAFETY SYRINGE/SHIELD) 25G X 5/8" 3 ML MISC 1,000 mcg by Misc.(Non-Drug; Combo Route) route every 30 days.    Marland Kitchen triamcinolone (KENALOG) 0.1 % paste Apply to mouth as needed.    . Vitamin D, Ergocalciferol, (DRISDOL) 1.25 MG (50000 UT) CAPS capsule      Current Facility-Administered Medications  Medication Dose Route Frequency Provider Last Rate Last Admin  . triamcinolone acetonide (KENALOG) 10 MG/ML injection 10 mg  10 mg Other Once [place], Titorya, DPM      . triamcinolone acetonide (KENALOG-40) injection 20 mg  20 mg Other Once [place], Titorya, DPM      . triamcinolone acetonide (KENALOG-40) injection 20 mg  20 mg Other Once Landis Martins, DPM      . triamcinolone acetonide (KENALOG-40) injection 20 mg  20 mg  Other Once Landis Martins, DPM      . triamcinolone acetonide (KENALOG-40) injection 20 mg  20 mg Other Once Landis Martins, DPM         Allergies:  Allergies  Allergen Reactions  . Ciprofloxacin Other (See Comments)    Felt like "bugs crawling" when on Lovenox  . Neomycin-Bacitracin Zn-Polymyx Rash  . Shellfish Allergy Rash and Swelling    Shrimp   . Shellfish-Derived Products Swelling    Shrimp Shrimp  . Bactrim [Sulfamethoxazole-Trimethoprim] Other (See Comments)    headache Headache   . Oxycodone Nausea  And Vomiting  . Percocet [Oxycodone-Acetaminophen] Nausea And Vomiting  . Amoxicillin Rash  . Latex Rash  . Miconazole Nitrate Rash  . Oxycodone-Acetaminophen Nausea And Vomiting  . Penicillins Rash  . Sulfa Antibiotics Rash  . Sulfasalazine Rash      Physical Exam:    Blood pressure 124/81, pulse 79, temperature 97.8 F (36.6 C), temperature source Tympanic, resp. rate 18, height 5\' 6"  (1.676 m), weight 254 lb (115.2 kg), SpO2 100 %, unknown if currently breastfeeding.     ECOG: 0   General appearance: Alert, awake without any distress. Head: Atraumatic without abnormalities Oropharynx: Without any thrush or ulcers. Eyes: No scleral icterus. Lymph nodes: No lymphadenopathy noted in the cervical, supraclavicular, or axillary nodes Heart:regular rate and rhythm, without any murmurs or gallops.   Lung: Clear to auscultation without any rhonchi, wheezes or dullness to percussion. Abdomin: Soft, nontender without any shifting dullness or ascites. Musculoskeletal: No clubbing or cyanosis. Neurological: No motor or sensory deficits. Skin: No rashes or lesions.              Lab Results: Lab Results  Component Value Date   WBC 11.2 (H) 05/05/2020   HGB 13.8 05/05/2020   HCT 40.8 05/05/2020   MCV 86.8 05/05/2020   PLT 277 05/05/2020     Chemistry      Component Value Date/Time   NA 139 08/27/2015 1411   K 3.9 08/27/2015 1411   CL 99 07/01/2013 1115   CO2 28 08/27/2015 1411   BUN 11.5 08/27/2015 1411   CREATININE 0.9 08/27/2015 1411      Component Value Date/Time   CALCIUM 9.7 08/27/2015 1411   ALKPHOS 100 08/27/2015 1411   AST 17 08/27/2015 1411   ALT 18 08/27/2015 1411   BILITOT 0.79 08/27/2015 1411     Iron/TIBC/Ferritin/ %Sat    Component Value Date/Time   IRON 40 (L) 05/05/2020 1518   IRON 72 03/14/2017 1510   TIBC 256 05/05/2020 1518   TIBC 263 03/14/2017 1510   FERRITIN 389 (H) 05/05/2020 1518   FERRITIN 321 (H) 03/14/2017 1510    IRONPCTSAT 16 (L) 05/05/2020 1518   IRONPCTSAT 28 03/14/2017 1510    Impression and Plan:  35 year old woman with:   1.  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  She has received intravenous iron intermittently since that time.  Iron studies in September 2021 showed decrease saturation and borderline iron.  She did receive intravenous iron infusion subsequently in the form of ferric gluconate concluded on June 15, 2020.  Laboratory data today reviewed which showed a normal hemoglobin.  Iron studies remain pending at this time.  Repeat intravenous iron will be given if her iron studies suggest deficiency.  Risks and benefits of repeating intravenous iron were discussed and she is agreeable to receive it if iron levels are low.  2.  Venous thromboembolism diagnosed in 2012  in the setting of heterozygous factor V Leiden mutation.  Currently on lifetime anticoagulation with Xarelto.  Risks and benefits of continuing this treatment on long-term complications including worsening bleeding were reviewed at this time.  3. Irregular menstrual cycles: Controlled with IUD at this time.  4.  Covid vaccine considerations: I continue to urge her to get vaccinated despite her recent infection.  Continue to reiterate the extra immunity she will get with vaccination and natural immunity.  She is still reluctant at this time.   5. Follow-up: In 3 months for repeat follow-up.  30  minutes were dedicated to this visit.  Time spent on reviewing disease status, reviewing laboratory data and future plan of care review.  Eli Hose, MD 09/01/20

## 2020-09-02 ENCOUNTER — Telehealth: Payer: Self-pay

## 2020-09-02 NOTE — Telephone Encounter (Signed)
Called patient and let her know that per Dr. Alen Blew her iron is back to normal and no IV iron is needed. Patient verbalized understanding.

## 2020-09-02 NOTE — Telephone Encounter (Signed)
-----   Message from Wyatt Portela, MD sent at 09/02/2020  8:12 AM EST ----- Please let her know her iron is back to normal now. No IV iron needed.

## 2020-11-03 LAB — HM PAP SMEAR

## 2020-12-01 ENCOUNTER — Inpatient Hospital Stay (HOSPITAL_BASED_OUTPATIENT_CLINIC_OR_DEPARTMENT_OTHER): Payer: BLUE CROSS/BLUE SHIELD | Admitting: Oncology

## 2020-12-01 ENCOUNTER — Inpatient Hospital Stay: Payer: BLUE CROSS/BLUE SHIELD | Attending: Oncology

## 2020-12-01 ENCOUNTER — Other Ambulatory Visit: Payer: Self-pay

## 2020-12-01 VITALS — BP 121/70 | HR 89 | Temp 98.3°F | Resp 18 | Ht 66.0 in | Wt 259.1 lb

## 2020-12-01 DIAGNOSIS — Z7901 Long term (current) use of anticoagulants: Secondary | ICD-10-CM | POA: Diagnosis not present

## 2020-12-01 DIAGNOSIS — N926 Irregular menstruation, unspecified: Secondary | ICD-10-CM | POA: Diagnosis present

## 2020-12-01 DIAGNOSIS — D500 Iron deficiency anemia secondary to blood loss (chronic): Secondary | ICD-10-CM | POA: Diagnosis not present

## 2020-12-01 DIAGNOSIS — D6851 Activated protein C resistance: Secondary | ICD-10-CM | POA: Diagnosis not present

## 2020-12-01 DIAGNOSIS — Z86718 Personal history of other venous thrombosis and embolism: Secondary | ICD-10-CM | POA: Insufficient documentation

## 2020-12-01 LAB — CBC WITH DIFFERENTIAL (CANCER CENTER ONLY)
Abs Immature Granulocytes: 0.03 10*3/uL (ref 0.00–0.07)
Basophils Absolute: 0.1 10*3/uL (ref 0.0–0.1)
Basophils Relative: 1 %
Eosinophils Absolute: 0.1 10*3/uL (ref 0.0–0.5)
Eosinophils Relative: 1 %
HCT: 42.2 % (ref 36.0–46.0)
Hemoglobin: 14.1 g/dL (ref 12.0–15.0)
Immature Granulocytes: 0 %
Lymphocytes Relative: 17 %
Lymphs Abs: 1.6 10*3/uL (ref 0.7–4.0)
MCH: 29.7 pg (ref 26.0–34.0)
MCHC: 33.4 g/dL (ref 30.0–36.0)
MCV: 88.8 fL (ref 80.0–100.0)
Monocytes Absolute: 0.7 10*3/uL (ref 0.1–1.0)
Monocytes Relative: 7 %
Neutro Abs: 7.1 10*3/uL (ref 1.7–7.7)
Neutrophils Relative %: 74 %
Platelet Count: 252 10*3/uL (ref 150–400)
RBC: 4.75 MIL/uL (ref 3.87–5.11)
RDW: 12.3 % (ref 11.5–15.5)
WBC Count: 9.6 10*3/uL (ref 4.0–10.5)
nRBC: 0 % (ref 0.0–0.2)

## 2020-12-01 NOTE — Progress Notes (Signed)
Hematology and Oncology Follow Up Visit  Robin Key [id number] 1986-04-18 35 y.o. 12/01/2020 3:17 PM Robin Key, [name], Melissa Key*   Principle Diagnosis: 35 year old woman with anemia related to chronic iron deficiency and menstrual blood losses diagnosed in 2012.    Secondary diagnosis: Venous thromboembolism diagnosed in 2012 due to heterozygous factor V Leiden mutation.    Prior Therapy:  Full dose anticoagulation with Lovenox after she had a C-section on 07/01/2013.   She is status post IV iron infusion as needed.  Last treatment was in November 2021.  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 She was switched to Lovenox in June 2018.  Therapy discontinued in March 2019.  Current therapy:   Xarelto 20 mg daily started in June 2019.   Interim History:  [name] returns today for a follow-up evaluation.  Since the last visit, she reports no major changes in her health.  She denies any hematochezia, melena or worsening menstrual cycles.  She has an IUD in place which have helped with regulation of her menstrual blood losses.  She denies any recent venous thrombosis but does have superficial veins in her lower extremity that are more prominent but not painful or thrombosed.     Medications: Updated on review. Current Outpatient Medications  Medication Sig Dispense Refill  . acyclovir (ZOVIRAX) 400 MG tablet     . butalbital-acetaminophen-caffeine (FIORICET, ESGIC) 50-325-40 MG per tablet Take 2 tablets by mouth every 6 (six) hours as needed for headache. 30 tablet 1  . cetirizine (ZYRTEC) 10 MG tablet Take 10 mg by mouth daily.    . diclofenac (VOLTAREN) 75 MG EC tablet TAKE 1 TABLET(75 MG) BY MOUTH TWICE DAILY 30 tablet 0  . fluticasone (FLONASE) 50 MCG/ACT nasal spray Place 2 sprays into both nostrils daily.    Marland Kitchen HYDROcodone-acetaminophen (NORCO) 7.5-325 MG tablet Take 1 tablet by mouth  every 6 (six) hours as needed for moderate pain. 21 tablet 0  . meloxicam (MOBIC) 15 MG tablet Take 1 tablet (15 mg total) by mouth daily. 30 tablet 0  . ondansetron (ZOFRAN-ODT) 8 MG disintegrating tablet Take 1 tablet by mouth 3 (three) times daily as needed.  0  . pantoprazole (PROTONIX) 40 MG tablet Take by mouth.    . rivaroxaban (XARELTO) 20 MG TABS tablet TAKE 1 TABLET(20 MG) BY MOUTH DAILY WITH SUPPER 30 tablet 0  . SUMAtriptan (IMITREX) 50 MG tablet     . SYRINGE-NEEDLE, DISP, 3 ML (MONOJECT SAFETY SYRINGE/SHIELD) 25G X 5/8" 3 ML MISC 1,000 mcg by Misc.(Non-Drug; Combo Route) route every 30 days.    Marland Kitchen triamcinolone (KENALOG) 0.1 % paste Apply to mouth as needed.    . Vitamin D, Ergocalciferol, (DRISDOL) 1.25 MG (50000 UT) CAPS capsule      Current Facility-Administered Medications  Medication Dose Route Frequency Provider Last Rate Last Admin  . triamcinolone acetonide (KENALOG) 10 MG/ML injection 10 mg  10 mg Other Once [place], Titorya, DPM      . triamcinolone acetonide (KENALOG-40) injection 20 mg  20 mg Other Once [place], Titorya, DPM      . triamcinolone acetonide (KENALOG-40) injection 20 mg  20 mg Other Once [place], Titorya, DPM      . triamcinolone acetonide (KENALOG-40) injection 20 mg  20 mg Other Once [place], Titorya, DPM      . triamcinolone acetonide (KENALOG-40) injection 20 mg  20 mg Other Once Landis Martins, DPM  Allergies:  Allergies  Allergen Reactions  . Ciprofloxacin Other (See Comments)    Felt like "bugs crawling" when on Lovenox  . Neomycin-Bacitracin Zn-Polymyx Rash  . Shellfish Allergy Rash and Swelling    Shrimp   . Shellfish-Derived Products Swelling    Shrimp Shrimp  . Bactrim [Sulfamethoxazole-Trimethoprim] Other (See Comments)    headache Headache   . Oxycodone Nausea And Vomiting  . Percocet [Oxycodone-Acetaminophen] Nausea And Vomiting  . Amoxicillin Rash  . Latex Rash  . Miconazole Nitrate Rash  . Oxycodone-Acetaminophen  Nausea And Vomiting  . Penicillins Rash  . Sulfa Antibiotics Rash  . Sulfasalazine Rash      Physical Exam:     Blood pressure 121/70, pulse 89, temperature 98.3 F (36.8 C), temperature source Tympanic, resp. rate 18, height 5\' 6"  (1.676 m), weight 259 lb 1.6 oz (117.5 kg), SpO2 100 %, unknown if currently breastfeeding.     ECOG: 0    General appearance: Comfortable appearing without any discomfort Head: Normocephalic without any trauma Oropharynx: Mucous membranes are moist and pink without any thrush or ulcers. Eyes: Pupils are equal and round reactive to light. Lymph nodes: No cervical, supraclavicular, inguinal or axillary lymphadenopathy.   Heart:regular rate and rhythm.  S1 and S2 without leg edema. Lung: Clear without any rhonchi or wheezes.  No dullness to percussion. Abdomin: Soft, nontender, nondistended with good bowel sounds.  No hepatosplenomegaly. Musculoskeletal: No joint deformity or effusion.  Full range of motion noted. Neurological: No deficits noted on motor, sensory and deep tendon reflex exam. Skin: No petechial rash or dryness.  Appeared moist.  .              Lab Results: Lab Results  Component Value Date   WBC 8.4 09/01/2020   HGB 13.6 09/01/2020   HCT 42.2 09/01/2020   MCV 91.7 09/01/2020   PLT 249 09/01/2020     Chemistry      Component Value Date/Time   NA 139 08/27/2015 1411   K 3.9 08/27/2015 1411   CL 99 07/01/2013 1115   CO2 28 08/27/2015 1411   BUN 11.5 08/27/2015 1411   CREATININE 0.9 08/27/2015 1411      Component Value Date/Time   CALCIUM 9.7 08/27/2015 1411   ALKPHOS 100 08/27/2015 1411   AST 17 08/27/2015 1411   ALT 18 08/27/2015 1411   BILITOT 0.79 08/27/2015 1411     Iron/TIBC/Ferritin/ %Sat    Component Value Date/Time   IRON 87 09/01/2020 1435   IRON 72 03/14/2017 1510   TIBC 270 09/01/2020 1435   TIBC 263 03/14/2017 1510   FERRITIN 253 09/01/2020 1435   FERRITIN 321 (H) 03/14/2017 1510    IRONPCTSAT 32 09/01/2020 1435   IRONPCTSAT 28 03/14/2017 1510    Impression and Plan:  35 year old woman with:   1.  Iron deficiency anemia diagnosed in 2012.  Her anemia is related to chronic menstrual blood losses.    Her disease status was updated and treatment options were reviewed.  She has received ferric gluconate back in November 2021.  Risks and benefits of repeat infusion were discussed.  Laboratory data in January showed normal counts including hemoglobin and iron levels.  Hemoglobin today is within normal range and I recommended continued monitoring for the time being.    2.  Venous thromboembolism in the setting of heterozygous factor V Leiden mutation.  She is currently on Xarelto without any major complications.  3. Irregular menstrual cycles: More regular with the decrease in her  blood losses.  An IUD is in place.   4. Follow-up: She will return in 6 months for repeat follow-up.  30  minutes were spent on this visit.  The time was dedicated to reviewing laboratory data, disease status update and outlining future plan of care.  Zola Button, MD 12/01/20

## 2020-12-02 LAB — IRON AND TIBC
Iron: 71 ug/dL (ref 41–142)
Saturation Ratios: 25 % (ref 21–57)
TIBC: 278 ug/dL (ref 236–444)
UIBC: 207 ug/dL (ref 120–384)

## 2020-12-02 LAB — FERRITIN: Ferritin: 231 ng/mL (ref 11–307)

## 2020-12-20 ENCOUNTER — Other Ambulatory Visit: Payer: Self-pay | Admitting: Oncology

## 2020-12-20 ENCOUNTER — Telehealth: Payer: Self-pay | Admitting: *Deleted

## 2020-12-20 DIAGNOSIS — M79604 Pain in right leg: Secondary | ICD-10-CM

## 2020-12-20 DIAGNOSIS — M7989 Other specified soft tissue disorders: Secondary | ICD-10-CM

## 2020-12-20 NOTE — Telephone Encounter (Signed)
PC to patient, informed her Dr. Alen Blew has ordered an U/S of her leg, patient given Central Scheduling phone number to schedule the U/S.  She verbalizes understanding.

## 2020-12-20 NOTE — Telephone Encounter (Signed)
-----   Message from Wyatt Portela, MD sent at 12/20/2020 10:58 AM EDT ----- Regarding: RE: Knee pain [place] ordered. Thanks ----- Message ----- From: Rolene Course, RN Sent: 12/20/2020  10:29 AM EDT To: Wyatt Portela, MD Subject: Knee pain                                      Ms Friis called this morning and is wondering if she needs an U/S on her right leg to R/O DVT.  She says she started having pain last night alongside her right knee, also above & below her knee, it hurts to touch.  She denies redness or swelling.  She states you have referred her for an U/S in the past. Also states she is not willing to go to the ED because of possible covid exposure.  Please advise, thanks.

## 2020-12-21 ENCOUNTER — Ambulatory Visit (HOSPITAL_COMMUNITY): Payer: BLUE CROSS/BLUE SHIELD

## 2021-02-03 ENCOUNTER — Ambulatory Visit: Payer: BLUE CROSS/BLUE SHIELD | Admitting: Nurse Practitioner

## 2021-04-12 ENCOUNTER — Encounter: Payer: Self-pay | Admitting: Oncology

## 2021-06-01 ENCOUNTER — Other Ambulatory Visit: Payer: Self-pay

## 2021-06-01 ENCOUNTER — Inpatient Hospital Stay: Payer: BLUE CROSS/BLUE SHIELD | Attending: Oncology

## 2021-06-01 ENCOUNTER — Inpatient Hospital Stay (HOSPITAL_BASED_OUTPATIENT_CLINIC_OR_DEPARTMENT_OTHER): Payer: BLUE CROSS/BLUE SHIELD | Admitting: Oncology

## 2021-06-01 VITALS — BP 118/66 | HR 68 | Temp 98.4°F | Resp 18 | Ht 66.0 in | Wt 276.6 lb

## 2021-06-01 DIAGNOSIS — D6851 Activated protein C resistance: Secondary | ICD-10-CM | POA: Diagnosis not present

## 2021-06-01 DIAGNOSIS — D500 Iron deficiency anemia secondary to blood loss (chronic): Secondary | ICD-10-CM | POA: Insufficient documentation

## 2021-06-01 DIAGNOSIS — Z7901 Long term (current) use of anticoagulants: Secondary | ICD-10-CM | POA: Diagnosis not present

## 2021-06-01 DIAGNOSIS — N920 Excessive and frequent menstruation with regular cycle: Secondary | ICD-10-CM | POA: Insufficient documentation

## 2021-06-01 LAB — IRON AND TIBC
Iron: 122 ug/dL (ref 41–142)
Iron: 124 ug/dL (ref 41–142)
Saturation Ratios: 44 % (ref 21–57)
Saturation Ratios: 44 % (ref 21–57)
TIBC: 280 ug/dL (ref 236–444)
TIBC: 283 ug/dL (ref 236–444)
UIBC: 158 ug/dL (ref 120–384)
UIBC: 158 ug/dL (ref 120–384)

## 2021-06-01 LAB — CBC WITH DIFFERENTIAL (CANCER CENTER ONLY)
Abs Immature Granulocytes: 0.01 10*3/uL (ref 0.00–0.07)
Basophils Absolute: 0.1 10*3/uL (ref 0.0–0.1)
Basophils Relative: 1 %
Eosinophils Absolute: 0.1 10*3/uL (ref 0.0–0.5)
Eosinophils Relative: 1 %
HCT: 40.8 % (ref 36.0–46.0)
Hemoglobin: 13.5 g/dL (ref 12.0–15.0)
Immature Granulocytes: 0 %
Lymphocytes Relative: 18 %
Lymphs Abs: 1.3 10*3/uL (ref 0.7–4.0)
MCH: 29.7 pg (ref 26.0–34.0)
MCHC: 33.1 g/dL (ref 30.0–36.0)
MCV: 89.7 fL (ref 80.0–100.0)
Monocytes Absolute: 0.5 10*3/uL (ref 0.1–1.0)
Monocytes Relative: 7 %
Neutro Abs: 5.2 10*3/uL (ref 1.7–7.7)
Neutrophils Relative %: 73 %
Platelet Count: 231 10*3/uL (ref 150–400)
RBC: 4.55 MIL/uL (ref 3.87–5.11)
RDW: 12.2 % (ref 11.5–15.5)
WBC Count: 7.2 10*3/uL (ref 4.0–10.5)
nRBC: 0 % (ref 0.0–0.2)

## 2021-06-01 LAB — FERRITIN
Ferritin: 170 ng/mL (ref 11–307)
Ferritin: 183 ng/mL (ref 11–307)

## 2021-06-01 NOTE — Progress Notes (Signed)
Hematology and Oncology Follow Up Visit  Robin Key [id number] May 18, 1986 35 y.o. 06/01/2021 1:07 PM [name], Robin Key, [name], Robin J*   Principle Diagnosis: 35 year old woman with    Iron deficiency anemia related to menstrual blood losses diagnosed in 2012.     2.  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    Prior Therapy:   Full dose anticoagulation with Lovenox after she had a C-section on 07/01/2013.   She is status post IV iron infusion as needed.  Last treatment was in November 2021.  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 She was switched to Lovenox in June 2018.  Therapy discontinued in March 2019.  Current therapy:   Xarelto 20 mg daily started in June 2019.   Interim History:  Robin Key is here for a follow-up visit.  Since the last visit, she reports feeling well without any major complaints.  She denies any hematochezia melena or excessive menstrual bleeding.  She has not reported any excessive weakness or fatigue.  She denies any shortness of breath or dyspnea.     Medications: Reviewed without changes. Current Outpatient Medications  Medication Sig Dispense Refill   acyclovir (ZOVIRAX) 400 MG tablet      butalbital-acetaminophen-caffeine (FIORICET, ESGIC) 50-325-40 MG per tablet Take 2 tablets by mouth every 6 (six) hours as needed for headache. 30 tablet 1   cetirizine (ZYRTEC) 10 MG tablet Take 10 mg by mouth daily.     diclofenac (VOLTAREN) 75 MG EC tablet TAKE 1 TABLET(75 MG) BY MOUTH TWICE DAILY 30 tablet 0   fluticasone (FLONASE) 50 MCG/ACT nasal spray Place 2 sprays into both nostrils daily.     HYDROcodone-acetaminophen (NORCO) 7.5-325 MG tablet Take 1 tablet by mouth every 6 (six) hours as needed for moderate pain. 21 tablet 0   meloxicam (MOBIC) 15 MG tablet Take 1 tablet (15 mg total) by mouth daily. 30  tablet 0   ondansetron (ZOFRAN-ODT) 8 MG disintegrating tablet Take 1 tablet by mouth 3 (three) times daily as needed.  0   pantoprazole (PROTONIX) 40 MG tablet Take by mouth.     rivaroxaban (XARELTO) 20 MG TABS tablet TAKE 1 TABLET(20 MG) BY MOUTH DAILY WITH SUPPER 30 tablet 0   SUMAtriptan (IMITREX) 50 MG tablet      SYRINGE-NEEDLE, DISP, 3 ML 25G X 5/8" 3 ML MISC 1,000 mcg by Misc.(Non-Drug; Combo Route) route every 30 days.     triamcinolone (KENALOG) 0.1 % paste Apply to mouth as needed.     Vitamin D, Ergocalciferol, (DRISDOL) 1.25 MG (50000 UT) CAPS capsule      Current Facility-Administered Medications  Medication Dose Route Frequency Provider Last Rate Last Admin   triamcinolone acetonide (KENALOG) 10 MG/ML injection 10 mg  10 mg Other Once Landis Martins, DPM       triamcinolone acetonide (KENALOG-40) injection 20 mg  20 mg Other Once Landis Martins, DPM       triamcinolone acetonide (KENALOG-40) injection 20 mg  20 mg Other Once Landis Martins, DPM       triamcinolone acetonide (KENALOG-40) injection 20 mg  20 mg Other Once Landis Martins, DPM       triamcinolone acetonide (KENALOG-40) injection 20 mg  20 mg Other Once Landis Martins, DPM         Allergies:  Allergies  Allergen Reactions   Ciprofloxacin Other (See Comments)    Felt like "bugs crawling" when  on Lovenox   Neomycin-Bacitracin Zn-Polymyx Rash   Shellfish Allergy Rash and Swelling    Shrimp    Shellfish-Derived Products Swelling    Shrimp Shrimp   Bactrim [Sulfamethoxazole-Trimethoprim] Other (See Comments)    headache Headache    Oxycodone Nausea And Vomiting   Percocet [Oxycodone-Acetaminophen] Nausea And Vomiting   Amoxicillin Rash   Latex Rash   Miconazole Nitrate Rash   Oxycodone-Acetaminophen Nausea And Vomiting   Penicillins Rash   Sulfa Antibiotics Rash   Sulfasalazine Rash      Physical Exam:     Blood pressure 118/66, pulse 68, temperature 98.4 F (36.9 C), temperature  source Oral, resp. rate 18, height 5\' 6"  (1.676 m), weight 276 lb 9.6 oz (125.5 kg), SpO2 100 %, unknown if currently breastfeeding.      ECOG: 0   General appearance: Alert, awake without any distress. Head: Atraumatic without abnormalities Oropharynx: Without any thrush or ulcers. Eyes: No scleral icterus. Lymph nodes: No lymphadenopathy noted in the cervical, supraclavicular, or axillary nodes Heart:regular rate and rhythm, without any murmurs or gallops.   Lung: Clear to auscultation without any rhonchi, wheezes or dullness to percussion. Abdomin: Soft, nontender without any shifting dullness or ascites. Musculoskeletal: No clubbing or cyanosis. Neurological: No motor or sensory deficits. Skin: No rashes or lesions.  .              Lab Results: Lab Results  Component Value Date   WBC 9.6 12/01/2020   HGB 14.1 12/01/2020   HCT 42.2 12/01/2020   MCV 88.8 12/01/2020   PLT 252 12/01/2020     Chemistry      Component Value Date/Time   NA 139 08/27/2015 1411   K 3.9 08/27/2015 1411   CL 99 07/01/2013 1115   CO2 28 08/27/2015 1411   BUN 11.5 08/27/2015 1411   CREATININE 0.9 08/27/2015 1411      Component Value Date/Time   CALCIUM 9.7 08/27/2015 1411   ALKPHOS 100 08/27/2015 1411   AST 17 08/27/2015 1411   ALT 18 08/27/2015 1411   BILITOT 0.79 08/27/2015 1411     Iron/TIBC/Ferritin/ %Sat    Component Value Date/Time   IRON 71 12/01/2020 1507   IRON 72 03/14/2017 1510   TIBC 278 12/01/2020 1507   TIBC 263 03/14/2017 1510   FERRITIN 231 12/01/2020 1507   FERRITIN 321 (H) 03/14/2017 1510   IRONPCTSAT 25 12/01/2020 1507   IRONPCTSAT 28 03/14/2017 1510    Impression and Plan:  35 year old woman with:   1.  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     Iron studies in April 2022 were reviewed and were within normal range and same in January of the same year.  Risks and benefits of repeat intravenous  iron as needed were discussed.  Complications including arthralgias, myalgias and rarely anaphylaxis were discussed.  We will await iron studies for today and will determine whether she requires any additional intravenous iron.  Hemoglobin today is adequate and will await iron studies to make the final decision.   2.  Factor V Leiden mutation was venous thromboembolism.  She is currently on Xarelto without any major complications.  3. Irregular menstrual cycles: Regulated at this time with IUD without any issues.   4. Follow-up: In 6 months for repeat follow-up.  30  minutes were dedicated to this encounter.  Time was spent on reviewing laboratory data, disease status update and outlining future plan of care.  Zola Button,  MD 06/01/21

## 2021-06-29 ENCOUNTER — Other Ambulatory Visit: Payer: Self-pay | Admitting: Obstetrics and Gynecology

## 2021-08-03 ENCOUNTER — Other Ambulatory Visit: Payer: Self-pay

## 2021-08-03 ENCOUNTER — Telehealth: Payer: Self-pay | Admitting: *Deleted

## 2021-08-03 ENCOUNTER — Encounter: Payer: Self-pay | Admitting: Oncology

## 2021-08-03 ENCOUNTER — Ambulatory Visit (HOSPITAL_COMMUNITY)
Admission: RE | Admit: 2021-08-03 | Discharge: 2021-08-03 | Disposition: A | Discharge status: Home / Self Care | Payer: Self-pay | Source: Ambulatory Visit | Attending: Oncology | Admitting: Oncology

## 2021-08-03 ENCOUNTER — Other Ambulatory Visit: Payer: Self-pay | Admitting: *Deleted

## 2021-08-03 DIAGNOSIS — D6851 Activated protein C resistance: Secondary | ICD-10-CM | POA: Insufficient documentation

## 2021-08-03 NOTE — Telephone Encounter (Signed)
Discussed results of doppler study with patient in lobby - per Dr. Alen Blew no further intervention is needed, patient is to take Xarelto as directed earlier.  Patient advised to use warm compresses and tylenol for L leg pain.  Scheduling will contact patient with times of January 10 appointments.  Patient verbalizes understanding of all information.

## 2021-08-03 NOTE — Telephone Encounter (Signed)
Returned PC to patient - she called earlier to say she was seen at [facility] last night for pain, edema, & redness in her L leg & was dx'd with a DVT.  This hospital was unable to schedule a doppler study until January 17, she is concerned about waiting this long.  Per Dr. Alen Blew, bilateral doppler study ordered stat, patient is also to restart her xarelto.  Doppler study scheduled at [facility] at 2:00 today - patient informed of this appointment, also instructed to restart her xarelto.  Patient verbalizes understanding.

## 2021-08-03 NOTE — Progress Notes (Signed)
Bilateral lower extremity venous duplex completed. Refer to "CV Proc" under chart review to view preliminary results.  08/03/2021 2:24 PM Kelby Aline., MHA, RVT, RDCS, RDMS

## 2021-08-05 ENCOUNTER — Encounter: Payer: Self-pay | Admitting: Oncology

## 2021-08-05 ENCOUNTER — Telehealth: Payer: Self-pay | Admitting: *Deleted

## 2021-08-05 NOTE — Telephone Encounter (Signed)
-----   Message from Wyatt Portela, MD sent at 08/05/2021  9:40 AM EST ----- She needs to take Advil or ibuprofen 200 mg twice a day for the next 3 days. ----- Message ----- From: Rolene Course, RN Sent: 08/05/2021   9:16 AM EST To: Wyatt Portela, MD  She called this morning & said her leg is looking worse - painful, red & swollen.  She is taking her eliquis twice a day, of course her doppler study was negative for DVT 2 days ago.  Please advise.  Thanks, Bethena Roys

## 2021-08-05 NOTE — Telephone Encounter (Signed)
PC to patient, informed her of Dr. Hazeline Junker instructions below.  She verbalizes understanding.

## 2021-08-09 ENCOUNTER — Encounter: Payer: Self-pay | Admitting: *Deleted

## 2021-08-09 ENCOUNTER — Inpatient Hospital Stay: Payer: Self-pay | Attending: Oncology | Admitting: Oncology

## 2021-08-09 ENCOUNTER — Telehealth: Payer: Self-pay | Admitting: *Deleted

## 2021-08-09 ENCOUNTER — Other Ambulatory Visit: Payer: Self-pay

## 2021-08-09 VITALS — BP 116/78 | HR 73 | Temp 97.5°F | Resp 17 | Wt 273.4 lb

## 2021-08-09 DIAGNOSIS — D500 Iron deficiency anemia secondary to blood loss (chronic): Secondary | ICD-10-CM | POA: Insufficient documentation

## 2021-08-09 DIAGNOSIS — D6851 Activated protein C resistance: Secondary | ICD-10-CM | POA: Insufficient documentation

## 2021-08-09 DIAGNOSIS — Z7901 Long term (current) use of anticoagulants: Secondary | ICD-10-CM | POA: Insufficient documentation

## 2021-08-09 DIAGNOSIS — N920 Excessive and frequent menstruation with regular cycle: Secondary | ICD-10-CM | POA: Insufficient documentation

## 2021-08-09 MED ORDER — CEPHALEXIN 500 MG PO CAPS: 500.0000 mg | ORAL_CAPSULE | Freq: Two times a day (BID) | ORAL | 0 refills | Status: DC

## 2021-08-09 NOTE — Progress Notes (Signed)
Hematology and Oncology Follow Up Visit  Robin Key [id number] July 31, 1986 35 y.o. 08/09/2021 12:53 PM Robin Key, [name], Robin J*   Principle Diagnosis: 36 year old woman with factor V Leiden mutation with recurrent venous thromboembolism diagnosed in 2012.     Secondary diagnosis: Iron deficiency anemia related to menstrual blood losses diagnosed in 2012.      Prior Therapy:   Full dose anticoagulation with Lovenox after she had a C-section on 07/01/2013.   She is status post IV iron infusion as needed.  Last treatment was in November 2021.  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 She was switched to Lovenox in June 2018.  Therapy discontinued in March 2019.  Current therapy:   Xarelto 20 mg daily started in June 2019.  She has not been taking it leading up to December 2022.   Interim History:  [name] presents today for repeat evaluation.  Since the last visit, she was diagnosed with superficial thrombophlebitis after presenting with left leg erythema and swelling.  She had a Doppler on 08/03/2021 which showed acute superficial vein thrombosis of the left great saphenous vein in the distal thigh and the proximal calf of the left leg.  She was started on Xarelto although the erythema persisted.  She was seen again in the emergency department on January 1 and was prescribed Keflex.  She is started taking it with improvement in the erythema.  She denies any fevers chills or sweats.  He denies any other thrombosis or bleeding.  She has tolerated Xarelto reasonably well at this time.     Medications: Updated on review. Current Outpatient Medications  Medication Sig Dispense Refill   acyclovir (ZOVIRAX) 400 MG tablet      butalbital-acetaminophen-caffeine (FIORICET, ESGIC) 50-325-40 MG per tablet Take 2 tablets by mouth every 6 (six) hours as needed for headache. 30 tablet 1   cetirizine  (ZYRTEC) 10 MG tablet Take 10 mg by mouth daily.     diclofenac (VOLTAREN) 75 MG EC tablet TAKE 1 TABLET(75 MG) BY MOUTH TWICE DAILY 30 tablet 0   fluticasone (FLONASE) 50 MCG/ACT nasal spray Place 2 sprays into both nostrils daily.     HYDROcodone-acetaminophen (NORCO) 7.5-325 MG tablet Take 1 tablet by mouth every 6 (six) hours as needed for moderate pain. 21 tablet 0   meloxicam (MOBIC) 15 MG tablet Take 1 tablet (15 mg total) by mouth daily. 30 tablet 0   ondansetron (ZOFRAN-ODT) 8 MG disintegrating tablet Take 1 tablet by mouth 3 (three) times daily as needed.  0   pantoprazole (PROTONIX) 40 MG tablet Take by mouth.     rivaroxaban (XARELTO) 20 MG TABS tablet TAKE 1 TABLET(20 MG) BY MOUTH DAILY WITH SUPPER 30 tablet 0   SUMAtriptan (IMITREX) 50 MG tablet      SYRINGE-NEEDLE, DISP, 3 ML 25G X 5/8" 3 ML MISC 1,000 mcg by Misc.(Non-Drug; Combo Route) route every 30 days.     triamcinolone (KENALOG) 0.1 % paste Apply to mouth as needed.     Vitamin D, Ergocalciferol, (DRISDOL) 1.25 MG (50000 UT) CAPS capsule      Current Facility-Administered Medications  Medication Dose Route Frequency Provider Last Rate Last Admin   triamcinolone acetonide (KENALOG) 10 MG/ML injection 10 mg  10 mg Other Once Landis Martins, DPM       triamcinolone acetonide (KENALOG-40) injection 20 mg  20 mg Other Once Landis Martins, DPM       triamcinolone acetonide (KENALOG-40)  injection 20 mg  20 mg Other Once Landis Martins, DPM       triamcinolone acetonide (KENALOG-40) injection 20 mg  20 mg Other Once Landis Martins, DPM       triamcinolone acetonide (KENALOG-40) injection 20 mg  20 mg Other Once Landis Martins, DPM         Allergies:  Allergies  Allergen Reactions   Ciprofloxacin Other (See Comments)    Felt like "bugs crawling" when on Lovenox   Neomycin-Bacitracin Zn-Polymyx Rash   Shellfish Allergy Rash and Swelling    Shrimp    Shellfish-Derived Products Swelling    Shrimp Shrimp   Bactrim  [Sulfamethoxazole-Trimethoprim] Other (See Comments)    headache Headache    Oxycodone Nausea And Vomiting   Percocet [Oxycodone-Acetaminophen] Nausea And Vomiting   Amoxicillin Rash   Latex Rash   Miconazole Nitrate Rash   Oxycodone-Acetaminophen Nausea And Vomiting   Penicillins Rash   Sulfa Antibiotics Rash   Sulfasalazine Rash      Physical Exam:           ECOG: 0    General appearance: Comfortable appearing without any discomfort Head: Normocephalic without any trauma Oropharynx: Mucous membranes are moist and pink without any thrush or ulcers. Eyes: Pupils are equal and round reactive to light. Lymph nodes: No cervical, supraclavicular, inguinal or axillary lymphadenopathy.   Heart:regular rate and rhythm.  S1 and S2 without leg edema. Lung: Clear without any rhonchi or wheezes.  No dullness to percussion. Abdomin: Soft, nontender, nondistended with good bowel sounds.  No hepatosplenomegaly. Musculoskeletal: No joint deformity or effusion.  Full range of motion noted. Neurological: No deficits noted on motor, sensory and deep tendon reflex exam. Skin: Mild erythema noted along the anterior portion of her thigh.  .              Lab Results: Lab Results  Component Value Date   WBC 7.2 06/01/2021   HGB 13.5 06/01/2021   HCT 40.8 06/01/2021   MCV 89.7 06/01/2021   PLT 231 06/01/2021     Chemistry      Component Value Date/Time   NA 139 08/27/2015 1411   K 3.9 08/27/2015 1411   CL 99 07/01/2013 1115   CO2 28 08/27/2015 1411   BUN 11.5 08/27/2015 1411   CREATININE 0.9 08/27/2015 1411      Component Value Date/Time   CALCIUM 9.7 08/27/2015 1411   ALKPHOS 100 08/27/2015 1411   AST 17 08/27/2015 1411   ALT 18 08/27/2015 1411   BILITOT 0.79 08/27/2015 1411     Iron/TIBC/Ferritin/ %Sat    Component Value Date/Time   IRON 124 06/01/2021 1313   IRON 122 06/01/2021 1313   IRON 72 03/14/2017 1510   TIBC 283 06/01/2021 1313   TIBC 280  06/01/2021 1313   TIBC 263 03/14/2017 1510   FERRITIN 170 06/01/2021 1313   FERRITIN 183 06/01/2021 1313   FERRITIN 321 (H) 03/14/2017 1510   IRONPCTSAT 44 06/01/2021 1313   IRONPCTSAT 44 06/01/2021 1313   IRONPCTSAT 28 03/14/2017 1510    Impression and Plan:  36 year old woman with:   1.  Left lower extremity superficial thrombophlebitis diagnosed in December 2022.  She has no evidence of deep vein thrombosis.  This is in the setting of factor V Leiden mutation.   At this time, I recommended continued anticoagulation with Xarelto as well as anti-inflammatory treatment with ibuprofen.  Given the possibility of superimposed cellulitis I recommended continuing Keflex for additional week and will  reevaluate at that time.   2.  Iron deficiency anemia: We will update iron studies with the next visit.  3.  Heavy menstrual cycles: She has an IUD in place with some occasional bleeding.  We will continue to monitor with subsequent visits.  4. Follow-up: She will return next week for repeat evaluation.  30  minutes were meant on this visit.  The time was dedicated to reviewing her disease status, treatment choices, addressing questions about future plan of care.  Zola Button, MD 08/09/21

## 2021-08-09 NOTE — Telephone Encounter (Signed)
Patient called. She would like to be seen today by Dr. Alen Blew, does not think she should wait until 08/16/21 to be seen.  On 08/07/21  had increased pain, swelling, purple color/redness in left lower leg. She contacted [place] triage. They advised her to go to ED within 4 hours, so she went to [facility]. Patient reports they marked her leg and that swelling has increased an inch from markings.She states she does not want to be sent to ED as they are not doing anything for her there.  Was also given Rx for Keflex. She has not taken Keflex.  Dr. Alen Blew informed of patient message. Per Dr. Alen Blew, patient offered appt today at 1pm, patient in agreement and will come. [facility] schedule message sent

## 2021-08-15 ENCOUNTER — Other Ambulatory Visit: Payer: Self-pay | Admitting: *Deleted

## 2021-08-15 DIAGNOSIS — D6851 Activated protein C resistance: Secondary | ICD-10-CM

## 2021-08-16 ENCOUNTER — Encounter: Payer: Self-pay | Admitting: *Deleted

## 2021-08-16 ENCOUNTER — Other Ambulatory Visit: Payer: Self-pay

## 2021-08-16 ENCOUNTER — Inpatient Hospital Stay: Payer: Self-pay

## 2021-08-16 ENCOUNTER — Inpatient Hospital Stay (HOSPITAL_BASED_OUTPATIENT_CLINIC_OR_DEPARTMENT_OTHER): Payer: Self-pay | Admitting: Oncology

## 2021-08-16 VITALS — BP 133/84 | HR 76 | Temp 97.8°F | Resp 18 | Ht 66.0 in | Wt 277.0 lb

## 2021-08-16 DIAGNOSIS — D6851 Activated protein C resistance: Secondary | ICD-10-CM

## 2021-08-16 DIAGNOSIS — D500 Iron deficiency anemia secondary to blood loss (chronic): Secondary | ICD-10-CM

## 2021-08-16 LAB — CBC WITH DIFFERENTIAL (CANCER CENTER ONLY)
Abs Immature Granulocytes: 0.02 10*3/uL (ref 0.00–0.07)
Basophils Absolute: 0.1 10*3/uL (ref 0.0–0.1)
Basophils Relative: 1 %
Eosinophils Absolute: 0.2 10*3/uL (ref 0.0–0.5)
Eosinophils Relative: 2 %
HCT: 43.1 % (ref 36.0–46.0)
Hemoglobin: 14 g/dL (ref 12.0–15.0)
Immature Granulocytes: 0 %
Lymphocytes Relative: 16 %
Lymphs Abs: 1.1 10*3/uL (ref 0.7–4.0)
MCH: 29 pg (ref 26.0–34.0)
MCHC: 32.5 g/dL (ref 30.0–36.0)
MCV: 89.2 fL (ref 80.0–100.0)
Monocytes Absolute: 0.4 10*3/uL (ref 0.1–1.0)
Monocytes Relative: 6 %
Neutro Abs: 5.3 10*3/uL (ref 1.7–7.7)
Neutrophils Relative %: 75 %
Platelet Count: 280 10*3/uL (ref 150–400)
RBC: 4.83 MIL/uL (ref 3.87–5.11)
RDW: 13 % (ref 11.5–15.5)
WBC Count: 7.1 10*3/uL (ref 4.0–10.5)
nRBC: 0 % (ref 0.0–0.2)

## 2021-08-16 LAB — IRON AND IRON BINDING CAPACITY (CC-WL,HP ONLY)
Iron: 112 ug/dL (ref 28–170)
Saturation Ratios: 36 % — ABNORMAL HIGH (ref 10.4–31.8)
TIBC: 314 ug/dL (ref 250–450)
UIBC: 202 ug/dL (ref 148–442)

## 2021-08-16 LAB — FERRITIN: Ferritin: 229 ng/mL (ref 11–307)

## 2021-08-16 NOTE — Progress Notes (Signed)
Hematology and Oncology Follow Up Visit  Robin Key [id number] [birth year] 36 y.o. 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     Secondary diagnosis: Iron deficiency anemia related to menstrual blood losses diagnosed in 2012.      Prior Therapy:   Full dose anticoagulation with Lovenox after she had a C-section on 07/01/2013.   She is status post IV iron infusion as needed.  Last treatment was in November 2021.  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 She was switched to Lovenox in June 2018.  Therapy discontinued in March 2019.  Current therapy:   Xarelto 20 mg daily started in June 2019.  She has not been taking it leading up to December 2022.  Xarelto 15 mg twice daily started in December 2022 due to superficial thrombophlebitis recurrence.   Interim History:  [name] returns today for a follow-up visit.  She reports no recent changes in her health.  She continues to have swelling in her left lower extremity with mild erythema and induration noted on the inner thigh.  She is completing a course of antibiotics at this time.  She is currently on Xarelto which she has tolerated very well.  She denies any fevers, chills or sweats.  She has reported some occasional blisters associated with this mild erythema.     Medications: Reviewed without changes. Current Outpatient Medications  Medication Sig Dispense Refill   acyclovir (ZOVIRAX) 400 MG tablet      butalbital-acetaminophen-caffeine (FIORICET, ESGIC) 50-325-40 MG per tablet Take 2 tablets by mouth every 6 (six) hours as needed for headache. 30 tablet 1   cephALEXin (KEFLEX) 500 MG capsule Take 1 capsule (500 mg total) by mouth 2 (two) times daily. 20 capsule 0   cetirizine (ZYRTEC) 10 MG  tablet Take 10 mg by mouth daily.     diclofenac (VOLTAREN) 75 MG EC tablet TAKE 1 TABLET(75 MG) BY MOUTH TWICE DAILY 30 tablet 0   fluticasone (FLONASE) 50 MCG/ACT nasal spray Place 2 sprays into both nostrils daily.     HYDROcodone-acetaminophen (NORCO) 7.5-325 MG tablet Take 1 tablet by mouth every 6 (six) hours as needed for moderate pain. 21 tablet 0   meloxicam (MOBIC) 15 MG tablet Take 1 tablet (15 mg total) by mouth daily. 30 tablet 0   ondansetron (ZOFRAN-ODT) 8 MG disintegrating tablet Take 1 tablet by mouth 3 (three) times daily as needed.  0   pantoprazole (PROTONIX) 40 MG tablet Take by mouth.     rivaroxaban (XARELTO) 20 MG TABS tablet TAKE 1 TABLET(20 MG) BY MOUTH DAILY WITH SUPPER 30 tablet 0   SUMAtriptan (IMITREX) 50 MG tablet      SYRINGE-NEEDLE, DISP, 3 ML 25G X 5/8" 3 ML MISC 1,000 mcg by Misc.(Non-Drug; Combo Route) route every 30 days.     triamcinolone (KENALOG) 0.1 % paste Apply to mouth as needed.     Vitamin D, Ergocalciferol, (DRISDOL) 1.25 MG (50000 UT) CAPS capsule      Current Facility-Administered Medications  Medication Dose Route Frequency Provider Last Rate Last Admin   triamcinolone acetonide (KENALOG) 10 MG/ML injection 10 mg  10 mg Other Once Landis Martins, DPM       triamcinolone acetonide (KENALOG-40) injection 20 mg  20 mg Other Once Landis Martins, DPM       triamcinolone acetonide (  KENALOG-40) injection 20 mg  20 mg Other Once Landis Martins, DPM       triamcinolone acetonide (KENALOG-40) injection 20 mg  20 mg Other Once Landis Martins, DPM       triamcinolone acetonide (KENALOG-40) injection 20 mg  20 mg Other Once Landis Martins, DPM         Allergies:  Allergies  Allergen Reactions   Ciprofloxacin Other (See Comments)    Felt like "bugs crawling" when on Lovenox   Neomycin-Bacitracin Zn-Polymyx Rash   Shellfish Allergy Rash and Swelling    Shrimp    Shellfish-Derived Products Swelling    Shrimp Shrimp   Bactrim  [Sulfamethoxazole-Trimethoprim] Other (See Comments)    headache Headache    Oxycodone Nausea And Vomiting   Percocet [Oxycodone-Acetaminophen] Nausea And Vomiting   Amoxicillin Rash   Latex Rash   Miconazole Nitrate Rash   Oxycodone-Acetaminophen Nausea And Vomiting   Penicillins Rash   Sulfa Antibiotics Rash   Sulfasalazine Rash      Physical Exam:       Blood pressure 133/84, pulse 76, temperature 97.8 F (36.6 C), temperature source Temporal, resp. rate 18, height 5\' 6"  (1.676 m), weight 277 lb (125.6 kg), SpO2 100 %, unknown if currently breastfeeding.     ECOG: 0   General appearance: Alert, awake without any distress. Head: Atraumatic without abnormalities Oropharynx: Without any thrush or ulcers. Eyes: No scleral icterus. Lymph nodes: No lymphadenopathy noted in the cervical, supraclavicular, or axillary nodes Heart:regular rate and rhythm, without any murmurs or gallops.   Lung: Clear to auscultation without any rhonchi, wheezes or dullness to percussion. Abdomin: Soft, nontender without any shifting dullness or ascites. Musculoskeletal: No clubbing or cyanosis. Neurological: No motor or sensory deficits. Skin: Erythema and induration noted on the inner left thigh.  Appears to have improved since last week.   .              Lab Results: Lab Results  Component Value Date   WBC 7.2 06/01/2021   HGB 13.5 06/01/2021   HCT 40.8 06/01/2021   MCV 89.7 06/01/2021   PLT 231 06/01/2021     Chemistry      Component Value Date/Time   NA 139 08/27/2015 1411   K 3.9 08/27/2015 1411   CL 99 07/01/2013 1115   CO2 28 08/27/2015 1411   BUN 11.5 08/27/2015 1411   CREATININE 0.9 08/27/2015 1411      Component Value Date/Time   CALCIUM 9.7 08/27/2015 1411   ALKPHOS 100 08/27/2015 1411   AST 17 08/27/2015 1411   ALT 18 08/27/2015 1411   BILITOT 0.79 08/27/2015 1411     Iron/TIBC/Ferritin/ %Sat    Component Value Date/Time   IRON 124  06/01/2021 1313   IRON 122 06/01/2021 1313   IRON 72 03/14/2017 1510   TIBC 283 06/01/2021 1313   TIBC 280 06/01/2021 1313   TIBC 263 03/14/2017 1510   FERRITIN 170 06/01/2021 1313   FERRITIN 183 06/01/2021 1313   FERRITIN 321 (H) 03/14/2017 1510   IRONPCTSAT 44 06/01/2021 1313   IRONPCTSAT 44 06/01/2021 1313   IRONPCTSAT 28 03/14/2017 1510    Impression and Plan:  36 year old woman with:   1.  Recurrent thromboembolism with superficial thrombophlebitis noted in December 2022.  The natural course of this disease and treatment choices were discussed at this time.  I have recommended continuing Xarelto lifetime at this time given her recurrent issues with thromboembolism.  She is agreeable to continue at this  time.    She has developed superimposed cellulitis on top of thrombophlebitis and appears to be improving with antibiotics.  I recommended additional 2 weeks of rest to ensure adequate healing.  2.  Iron deficiency anemia: Related to chronic menstrual blood losses.  This will be updated today and replace as needed.  Risks and benefits of intravenous iron infusion were reviewed.  Her hemoglobin is within normal range.  3.  Heavy menstrual cycles: Manageable at this time with IUD.    4. Follow-up: In 3 months for repeat follow-up.  30  minutes were dedicated to this encounter.  The time was spent on reviewing laboratory data, disease status update and outlining future plan of care discussion.  Zola Button, MD 08/16/21

## 2021-08-24 ENCOUNTER — Encounter: Payer: Self-pay | Admitting: Oncology

## 2021-08-25 ENCOUNTER — Encounter: Payer: Self-pay | Admitting: *Deleted

## 2021-09-06 ENCOUNTER — Telehealth: Payer: Self-pay

## 2021-09-06 NOTE — Telephone Encounter (Signed)
-----   Message from Wyatt Portela, MD sent at 09/06/2021  1:31 PM EST ----- No further recommendations.  She needs to continue with Xarelto. ----- Message ----- From: Doreen Salvage, LPN Sent: [id number]/[id number]   1:18 PM EST To: Wyatt Portela, MD  Pt called stating she has new spots on the back of her knee of superficial thrombophlebitis that she has been experiencing already. She is still taking the [place] as prescribed and she has Covid now. She wants to know if you have any recommendations that could help and if it could be the Covid making the superficial thrombophlebitis worse?

## 2021-09-06 NOTE — Telephone Encounter (Addendum)
Pt called w/ concerns below, Dr. Hazeline Junker wants pt to continue taking her Xarelto. Pt also asked if she could get a note for work extending how long she needs to be out as well due to the new spots she has found and now having Covid. Pt was told this LPN would follow up with Dr. Alen Blew regarding this question as well. Pt then became upset since there was not a definite answer regarding the work letter until the MD gives me an answer then pt hung up.

## 2021-09-21 ENCOUNTER — Encounter: Payer: Self-pay | Admitting: Oncology

## 2021-11-10 ENCOUNTER — Telehealth: Payer: Self-pay | Admitting: Oncology

## 2021-11-10 NOTE — Telephone Encounter (Signed)
Called patient regarding upcomming appointments, patient is notified.  ?

## 2021-11-15 ENCOUNTER — Inpatient Hospital Stay: Payer: Self-pay | Attending: Oncology

## 2021-11-15 ENCOUNTER — Inpatient Hospital Stay (HOSPITAL_BASED_OUTPATIENT_CLINIC_OR_DEPARTMENT_OTHER): Payer: Self-pay | Admitting: Oncology

## 2021-11-15 ENCOUNTER — Other Ambulatory Visit: Payer: Self-pay | Admitting: *Deleted

## 2021-11-15 ENCOUNTER — Other Ambulatory Visit: Payer: Self-pay

## 2021-11-15 VITALS — BP 128/77 | HR 78 | Temp 97.7°F | Resp 19 | Ht 66.0 in | Wt 277.7 lb

## 2021-11-15 DIAGNOSIS — D6851 Activated protein C resistance: Secondary | ICD-10-CM

## 2021-11-15 DIAGNOSIS — Z7901 Long term (current) use of anticoagulants: Secondary | ICD-10-CM | POA: Insufficient documentation

## 2021-11-15 DIAGNOSIS — D500 Iron deficiency anemia secondary to blood loss (chronic): Secondary | ICD-10-CM

## 2021-11-15 DIAGNOSIS — N920 Excessive and frequent menstruation with regular cycle: Secondary | ICD-10-CM | POA: Insufficient documentation

## 2021-11-15 LAB — CBC WITH DIFFERENTIAL (CANCER CENTER ONLY)
Abs Immature Granulocytes: 0.02 10*3/uL (ref 0.00–0.07)
Basophils Absolute: 0 10*3/uL (ref 0.0–0.1)
Basophils Relative: 0 %
Eosinophils Absolute: 0.1 10*3/uL (ref 0.0–0.5)
Eosinophils Relative: 1 %
HCT: 41.7 % (ref 36.0–46.0)
Hemoglobin: 13.7 g/dL (ref 12.0–15.0)
Immature Granulocytes: 0 %
Lymphocytes Relative: 16 %
Lymphs Abs: 1.5 10*3/uL (ref 0.7–4.0)
MCH: 29 pg (ref 26.0–34.0)
MCHC: 32.9 g/dL (ref 30.0–36.0)
MCV: 88.2 fL (ref 80.0–100.0)
Monocytes Absolute: 0.7 10*3/uL (ref 0.1–1.0)
Monocytes Relative: 7 %
Neutro Abs: 7.2 10*3/uL (ref 1.7–7.7)
Neutrophils Relative %: 76 %
Platelet Count: 238 10*3/uL (ref 150–400)
RBC: 4.73 MIL/uL (ref 3.87–5.11)
RDW: 12.9 % (ref 11.5–15.5)
WBC Count: 9.5 10*3/uL (ref 4.0–10.5)
nRBC: 0 % (ref 0.0–0.2)

## 2021-11-15 LAB — IRON AND IRON BINDING CAPACITY (CC-WL,HP ONLY)
Iron: 110 ug/dL (ref 28–170)
Saturation Ratios: 35 % — ABNORMAL HIGH (ref 10.4–31.8)
TIBC: 311 ug/dL (ref 250–450)
UIBC: 201 ug/dL (ref 148–442)

## 2021-11-15 LAB — FERRITIN: Ferritin: 140 ng/mL (ref 11–307)

## 2021-11-15 NOTE — Progress Notes (Signed)
Hematology and Oncology Follow Up Visit ? ?Robin Key ?[id number] ?May 02, 1986 36 y.o. ?11/15/2021 1:36 PM ?[name], [name], Robin Key*  ? ?Principle Diagnosis: 36 year old woman with factor V Leiden mutation and recurrent venous thromboembolism diagnosed in 2012.   ? ? ?Secondary diagnosis: Iron deficiency anemia diagnosed in 2012.  This is related to related to menstrual blood losses.   ? ? ? ?Prior Therapy:  ? ?Full dose anticoagulation with Lovenox after she had a C-section on 07/01/2013.  ? ?She is status post IV iron infusion as needed.  Last treatment was in November 2021. ? 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 She was switched to Lovenox in June 2018.  Therapy discontinued in March 2019. ? ?Xarelto 20 mg daily started in June 2019.  She has not been taking it leading up to December 2022. ? ?Current therapy:  ? ? ? ?Xarelto 15 mg twice daily started in December 2022 due to superficial thrombophlebitis recurrence.  She is currently taking Xarelto 20 mg daily. ? ? ?Interim History:  [name] is here for return evaluation.  Since last visit, she reports feeling well without any major complaints.  She continues to tolerate Xarelto without any recurrence of her superficial phlebitis.  She does have painful thrombosed veins that has improved at this time in her left lower extremity.  She denies any bleeding complications.  She denies any hematochezia, melena or easy bruising. ? ? ? ? ?Medications: Updated on review. ?Current Outpatient Medications  ?Medication Sig Dispense Refill  ? acyclovir (ZOVIRAX) 400 MG tablet     ? butalbital-acetaminophen-caffeine (FIORICET, ESGIC) 50-325-40 MG per tablet Take 2 tablets by mouth every 6 (six) hours as needed for headache. 30 tablet 1  ? cephALEXin (KEFLEX) 500 MG capsule Take 1 capsule (500 mg total) by mouth 2 (two) times daily. 20 capsule 0  ? cetirizine (ZYRTEC) 10 MG tablet  Take 10 mg by mouth daily.    ? diclofenac (VOLTAREN) 75 MG EC tablet TAKE 1 TABLET(75 MG) BY MOUTH TWICE DAILY 30 tablet 0  ? fluticasone (FLONASE) 50 MCG/ACT nasal spray Place 2 sprays into both nostrils daily.    ? HYDROcodone-acetaminophen (NORCO) 7.5-325 MG tablet Take 1 tablet by mouth every 6 (six) hours as needed for moderate pain. 21 tablet 0  ? meloxicam (MOBIC) 15 MG tablet Take 1 tablet (15 mg total) by mouth daily. 30 tablet 0  ? ondansetron (ZOFRAN-ODT) 8 MG disintegrating tablet Take 1 tablet by mouth 3 (three) times daily as needed.  0  ? pantoprazole (PROTONIX) 40 MG tablet Take by mouth.    ? rivaroxaban (XARELTO) 20 MG TABS tablet TAKE 1 TABLET(20 MG) BY MOUTH DAILY WITH SUPPER 30 tablet 0  ? SUMAtriptan (IMITREX) 50 MG tablet     ? SYRINGE-NEEDLE, DISP, 3 ML 25G X 5/8" 3 ML MISC 1,000 mcg by Misc.(Non-Drug; Combo Route) route every 30 days.    ? triamcinolone (KENALOG) 0.1 % paste Apply to mouth as needed.    ? Vitamin D, Ergocalciferol, (DRISDOL) 1.25 MG (50000 UT) CAPS capsule     ? ?Current Facility-Administered Medications  ?Medication Dose Route Frequency Provider Last Rate Last Admin  ? triamcinolone acetonide (KENALOG) 10 MG/ML injection 10 mg  10 mg Other Once Landis Martins, DPM      ? triamcinolone acetonide (KENALOG-40) injection 20 mg  20 mg Other Once Landis Martins, DPM      ? triamcinolone acetonide (KENALOG-40) injection 20  mg  20 mg Other Once Landis Martins, DPM      ? triamcinolone acetonide (KENALOG-40) injection 20 mg  20 mg Other Once Landis Martins, DPM      ? triamcinolone acetonide (KENALOG-40) injection 20 mg  20 mg Other Once Landis Martins, DPM      ? ? ? ?Allergies:  ?Allergies  ?Allergen Reactions  ? Ciprofloxacin Other (See Comments)  ?  Felt like "bugs crawling" when on Lovenox  ? Neomycin-Bacitracin Zn-Polymyx Rash  ? Shellfish Allergy Rash and Swelling  ?  Shrimp ?  ? Shellfish-Derived Products Swelling  ?  Shrimp ?Shrimp  ? Bactrim  [Sulfamethoxazole-Trimethoprim] Other (See Comments)  ?  headache ?Headache ?  ? Oxycodone Nausea And Vomiting  ? Percocet [Oxycodone-Acetaminophen] Nausea And Vomiting  ? Amoxicillin Rash  ? Latex Rash  ? Miconazole Nitrate Rash  ? Oxycodone-Acetaminophen Nausea And Vomiting  ? Penicillins Rash  ? Sulfa Antibiotics Rash  ? Sulfasalazine Rash  ? ? ? ? ?Physical Exam: ? ? ? ? ? ?Blood pressure 128/77, pulse 78, temperature 97.7 ?F (36.5 ?C), temperature source Temporal, resp. rate 19, height '5\' 6"'$  (1.676 m), weight 277 lb 11.2 oz (126 kg), SpO2 100 %, unknown if currently breastfeeding. ? ? ? ? ? ? ?ECOG: 0 ? ? ? ?General appearance: Comfortable appearing without any discomfort ?Head: Normocephalic without any trauma ?Oropharynx: Mucous membranes are moist and pink without any thrush or ulcers. ?Eyes: Pupils are equal and round reactive to light. ?Lymph nodes: No cervical, supraclavicular, inguinal or axillary lymphadenopathy.   ?Heart:regular rate and rhythm.  S1 and S2 without leg edema. ?Lung: Clear without any rhonchi or wheezes.  No dullness to percussion. ?Abdomin: Soft, nontender, nondistended with good bowel sounds.  No hepatosplenomegaly. ?Musculoskeletal: No joint deformity or effusion.  Full range of motion noted. ?Neurological: No deficits noted on motor, sensory and deep tendon reflex exam. ?Skin: No erythema or induration.  Varicose veins noted on her left lower extremity. ? ? ? ?. ? ? ? ? ? ? ? ? ? ? ? ? ? ?Lab Results: ?Lab Results  ?Component Value Date  ? WBC 7.1 08/16/2021  ? HGB 14.0 08/16/2021  ? HCT 43.1 08/16/2021  ? MCV 89.2 08/16/2021  ? PLT 280 08/16/2021  ? ?  Chemistry   ?   ?Component Value Date/Time  ? NA 139 08/27/2015 1411  ? K 3.9 08/27/2015 1411  ? CL 99 07/01/2013 1115  ? CO2 28 08/27/2015 1411  ? BUN 11.5 08/27/2015 1411  ? CREATININE 0.9 08/27/2015 1411  ?    ?Component Value Date/Time  ? CALCIUM 9.7 08/27/2015 1411  ? ALKPHOS 100 08/27/2015 1411  ? AST 17 08/27/2015 1411  ? ALT  18 08/27/2015 1411  ? BILITOT 0.79 08/27/2015 1411  ?  ? ?Iron/TIBC/Ferritin/ %Sat ?   ?Component Value Date/Time  ? IRON 112 08/16/2021 1136  ? IRON 72 03/14/2017 1510  ? TIBC 314 08/16/2021 1136  ? TIBC 263 03/14/2017 1510  ? FERRITIN 229 08/16/2021 1136  ? FERRITIN 321 (H) 03/14/2017 1510  ? IRONPCTSAT 36 (H) 08/16/2021 1136  ? IRONPCTSAT 28 03/14/2017 1510  ? ? ?Impression and Plan: ? ?36 year old woman with: ? ? ?1.  Recurrent thromboembolism initially diagnosed in 2012 and developed superficial thrombophlebitis in December 2022. ? ?Risks and benefits of continuing Xarelto versus discontinuation were reviewed.  Risk of bleeding associated with this treatments versus recurrent thrombosis complications including superficial thrombophlebitis were discussed.  At this time, she agrees  to continue Xarelto indefinitely. ? ?2.  Iron deficiency anemia: Is really exacerbated by anticoagulation.  She has not received any IV iron infusion recently.  We will continue to monitor iron stores and replace as needed.  Her hemoglobin today is within normal range iron studies are currently pending.  ? ?3.  Heavy menstrual cycles: Manageable with IUD without any increased bleeding. ? ?4. Follow-up: In 3 months for repeat follow-up. ? ?30  minutes were spent on this visit.  The time was dedicated to reviewing laboratory data, disease status update, treatment choices and addressing complication related to therapy. ? ?Zola Button, MD ?11/15/21  ?

## 2021-11-30 ENCOUNTER — Other Ambulatory Visit: Payer: BLUE CROSS/BLUE SHIELD

## 2021-11-30 ENCOUNTER — Ambulatory Visit: Payer: BLUE CROSS/BLUE SHIELD | Admitting: Oncology

## 2021-12-12 ENCOUNTER — Telehealth: Payer: Self-pay | Admitting: *Deleted

## 2021-12-12 NOTE — Telephone Encounter (Signed)
Ngan states she has had prolonged menstrual bleeding and she is wondering if she needs to come in for labs to have Hgb and Ferritin checked.  ?

## 2021-12-13 ENCOUNTER — Telehealth: Payer: Self-pay | Admitting: *Deleted

## 2021-12-13 NOTE — Telephone Encounter (Signed)
PC to patient, informed her if she is feeling weak/tired, Dr. Alen Blew advises that she may come in for labs.  Patient states she is very tired & has been bleeding for 2-3 weeks, lab appointment made for 12/14/21 @ 10:15 per patient request, she verbalizes understanding. ?

## 2021-12-14 ENCOUNTER — Telehealth: Payer: Self-pay | Admitting: *Deleted

## 2021-12-14 ENCOUNTER — Inpatient Hospital Stay: Payer: Commercial Managed Care - HMO | Attending: Oncology

## 2021-12-14 ENCOUNTER — Other Ambulatory Visit: Payer: Self-pay

## 2021-12-14 DIAGNOSIS — N920 Excessive and frequent menstruation with regular cycle: Secondary | ICD-10-CM | POA: Insufficient documentation

## 2021-12-14 DIAGNOSIS — D6851 Activated protein C resistance: Secondary | ICD-10-CM | POA: Insufficient documentation

## 2021-12-14 DIAGNOSIS — D500 Iron deficiency anemia secondary to blood loss (chronic): Secondary | ICD-10-CM | POA: Diagnosis present

## 2021-12-14 LAB — CBC WITH DIFFERENTIAL (CANCER CENTER ONLY)
Abs Immature Granulocytes: 0.03 10*3/uL (ref 0.00–0.07)
Basophils Absolute: 0.1 10*3/uL (ref 0.0–0.1)
Basophils Relative: 1 %
Eosinophils Absolute: 0.1 10*3/uL (ref 0.0–0.5)
Eosinophils Relative: 2 %
HCT: 43.4 % (ref 36.0–46.0)
Hemoglobin: 14.1 g/dL (ref 12.0–15.0)
Immature Granulocytes: 0 %
Lymphocytes Relative: 17 %
Lymphs Abs: 1.2 10*3/uL (ref 0.7–4.0)
MCH: 29 pg (ref 26.0–34.0)
MCHC: 32.5 g/dL (ref 30.0–36.0)
MCV: 89.3 fL (ref 80.0–100.0)
Monocytes Absolute: 0.5 10*3/uL (ref 0.1–1.0)
Monocytes Relative: 7 %
Neutro Abs: 5.2 10*3/uL (ref 1.7–7.7)
Neutrophils Relative %: 73 %
Platelet Count: 258 10*3/uL (ref 150–400)
RBC: 4.86 MIL/uL (ref 3.87–5.11)
RDW: 13 % (ref 11.5–15.5)
WBC Count: 7.2 10*3/uL (ref 4.0–10.5)
nRBC: 0 % (ref 0.0–0.2)

## 2021-12-14 LAB — FERRITIN: Ferritin: 86 ng/mL (ref 11–307)

## 2021-12-14 LAB — IRON AND IRON BINDING CAPACITY (CC-WL,HP ONLY)
Iron: 57 ug/dL (ref 28–170)
Saturation Ratios: 18 % (ref 10.4–31.8)
TIBC: 325 ug/dL (ref 250–450)
UIBC: 268 ug/dL (ref 148–442)

## 2021-12-14 NOTE — Telephone Encounter (Signed)
-----   Message from Wyatt Portela, MD sent at 12/14/2021  2:32 PM EDT ----- ?Please let her know her iron is normal ?

## 2021-12-14 NOTE — Telephone Encounter (Signed)
PC to patient, informed of Dr. Hazeline Junker message below, she verbalizes understanding. ?

## 2021-12-16 ENCOUNTER — Other Ambulatory Visit: Payer: Self-pay | Admitting: *Deleted

## 2021-12-16 MED ORDER — RIVAROXABAN 20 MG PO TABS: 20.0000 mg | ORAL_TABLET | Freq: Every day | ORAL | 0 refills | Status: DC

## 2021-12-22 ENCOUNTER — Telehealth: Payer: Self-pay | Admitting: *Deleted

## 2021-12-22 NOTE — Telephone Encounter (Signed)
Collaborative informed this nurse of patient call requesting Xarelto authorization.  She clots on Eliquis, will be out tomorrow with only two pills left. Confirmed prescription benefit information with [facility] [phone].   Initiated CoverMyMeds PA, [id number] ([employer]) yet cancelled upon speaking with Retia Passe for possible patient assistance savings plan for high cost medication.  Submitted yet she did not qualify.    Reports "new card received this week.  PA must be sent to [facility]."  This nurse received corrected Rx BIN and Rx PCN. Connected with [employer] representative for PA [phone]).  Requesting urgent PA unable to wait reported eight-day turnover.   Per Maudie Mercury, case number: [id number] is authorized for six months.   Connected with Retia Passe with notification of [place] approval.

## 2021-12-28 ENCOUNTER — Emergency Department (HOSPITAL_COMMUNITY)
Admission: EM | Admit: 2021-12-28 | Discharge: 2021-12-28 | Disposition: A | Discharge status: Home / Self Care | Payer: Commercial Managed Care - HMO | Attending: Emergency Medicine | Admitting: Emergency Medicine

## 2021-12-28 ENCOUNTER — Encounter (HOSPITAL_COMMUNITY): Payer: Self-pay | Admitting: Emergency Medicine

## 2021-12-28 ENCOUNTER — Emergency Department (HOSPITAL_COMMUNITY): Payer: Commercial Managed Care - HMO

## 2021-12-28 ENCOUNTER — Other Ambulatory Visit: Payer: Self-pay

## 2021-12-28 DIAGNOSIS — R1031 Right lower quadrant pain: Secondary | ICD-10-CM | POA: Insufficient documentation

## 2021-12-28 DIAGNOSIS — Z91040 Latex allergy status: Secondary | ICD-10-CM | POA: Insufficient documentation

## 2021-12-28 DIAGNOSIS — D509 Iron deficiency anemia, unspecified: Secondary | ICD-10-CM

## 2021-12-28 DIAGNOSIS — Z7901 Long term (current) use of anticoagulants: Secondary | ICD-10-CM | POA: Insufficient documentation

## 2021-12-28 HISTORY — DX: Deep phlebothrombosis in pregnancy, unspecified trimester: O22.30

## 2021-12-28 LAB — COMPREHENSIVE METABOLIC PANEL
ALT: 18 U/L (ref 0–44)
AST: 15 U/L (ref 15–41)
Albumin: 3.8 g/dL (ref 3.5–5.0)
Alkaline Phosphatase: 86 U/L (ref 38–126)
Anion gap: 6 (ref 5–15)
BUN: 8 mg/dL (ref 6–20)
CO2: 27 mmol/L (ref 22–32)
Calcium: 9.2 mg/dL (ref 8.9–10.3)
Chloride: 106 mmol/L (ref 98–111)
Creatinine, Ser: 0.81 mg/dL (ref 0.44–1.00)
GFR, Estimated: >60 mL/min (ref >60)
Glucose, Bld: 99 mg/dL (ref 70–99)
Potassium: 3.9 mmol/L (ref 3.5–5.1)
Sodium: 139 mmol/L (ref 135–145)
Total Bilirubin: 0.8 mg/dL (ref 0.3–1.2)
Total Protein: 6.7 g/dL (ref 6.5–8.1)

## 2021-12-28 LAB — CBC
HCT: 41.5 % (ref 36.0–46.0)
Hemoglobin: 14.1 g/dL (ref 12.0–15.0)
MCH: 30.5 pg (ref 26.0–34.0)
MCHC: 34 g/dL (ref 30.0–36.0)
MCV: 89.8 fL (ref 80.0–100.0)
Platelets: 221 10*3/uL (ref 150–400)
RBC: 4.62 MIL/uL (ref 3.87–5.11)
RDW: 12.9 % (ref 11.5–15.5)
WBC: 6.4 10*3/uL (ref 4.0–10.5)
nRBC: 0 % (ref 0.0–0.2)

## 2021-12-28 LAB — LIPASE, BLOOD: Lipase: 25 U/L (ref 11–51)

## 2021-12-28 LAB — URINALYSIS, ROUTINE W REFLEX MICROSCOPIC
Bilirubin Urine: NEGATIVE
Glucose, UA: NEGATIVE mg/dL
Hgb urine dipstick: NEGATIVE
Ketones, ur: NEGATIVE mg/dL
Leukocytes,Ua: NEGATIVE
Nitrite: NEGATIVE
Protein, ur: NEGATIVE mg/dL
Specific Gravity, Urine: 1.005 (ref 1.005–1.030)
pH: 7 (ref 5.0–8.0)

## 2021-12-28 LAB — I-STAT BETA HCG BLOOD, ED (MC, WL, AP ONLY): I-stat hCG, quantitative: <5 m[IU]/mL (ref <5)

## 2021-12-28 MED ORDER — SODIUM CHLORIDE 0.9 % IV BOLUS
1000.0000 mL | Freq: Once | INTRAVENOUS | Status: AC
  Administered 2021-12-28: 1000 mL via INTRAVENOUS

## 2021-12-28 MED ORDER — ONDANSETRON HCL 4 MG/2ML IJ SOLN
4.0000 mg | Freq: Once | INTRAMUSCULAR | Status: AC
  Administered 2021-12-28: 4 mg via INTRAVENOUS
  Filled 2021-12-28: qty 2

## 2021-12-28 MED ORDER — ONDANSETRON 4 MG PO TBDP: 4.0000 mg | ORAL_TABLET | Freq: Three times a day (TID) | ORAL | 0 refills | Status: DC | PRN

## 2021-12-28 MED ORDER — IOHEXOL 300 MG/ML  SOLN
100.0000 mL | Freq: Once | INTRAMUSCULAR | Status: AC | PRN
  Administered 2021-12-28: 100 mL via INTRAVENOUS

## 2021-12-28 NOTE — ED Provider Notes (Incomplete)
[facility] EMERGENCY DEPARTMENT Provider Note   CSN: [id number] Arrival date & time: 12/28/21  1117     History {Add pertinent medical, surgical, social history, OB history to HPI:1} Chief Complaint  Patient presents with   Abdominal Pain    Robin Key is a 36 y.o. female.  The history is provided by the patient. No language interpreter was used.  Abdominal Pain     Home Medications Prior to Admission medications   Medication Sig Start Date End Date Taking? Authorizing Provider  acyclovir (ZOVIRAX) 400 MG tablet  11/13/18   [provider]  butalbital-acetaminophen-caffeine (FIORICET, ESGIC) 50-325-40 MG per tablet Take 2 tablets by mouth every 6 (six) hours as needed for headache. 11/06/13   Wyatt Portela, MD  cephALEXin (KEFLEX) 500 MG capsule Take 1 capsule (500 mg total) by mouth 2 (two) times daily. 08/09/21   Wyatt Portela, MD  cetirizine (ZYRTEC) 10 MG tablet Take 10 mg by mouth daily.    [provider]  diclofenac (VOLTAREN) 75 MG EC tablet TAKE 1 TABLET(75 MG) BY MOUTH TWICE DAILY 05/03/18   Landis Martins, DPM  fluticasone (FLONASE) 50 MCG/ACT nasal spray Place 2 sprays into both nostrils daily.    [provider]  HYDROcodone-acetaminophen (NORCO) 7.5-325 MG tablet Take 1 tablet by mouth every 6 (six) hours as needed for moderate pain. 06/18/19   Landis Martins, DPM  meloxicam (MOBIC) 15 MG tablet Take 1 tablet (15 mg total) by mouth daily. 08/21/17   Evelina Bucy, DPM  ondansetron (ZOFRAN-ODT) 8 MG disintegrating tablet Take 1 tablet by mouth 3 (three) times daily as needed. 10/13/15   [provider]  pantoprazole (PROTONIX) 40 MG tablet Take by mouth. 11/13/18   [provider]  rivaroxaban (XARELTO) 20 MG TABS tablet Take 1 tablet (20 mg total) by mouth daily. 12/16/21   Wyatt Portela, MD  SUMAtriptan (IMITREX) 50 MG tablet  10/25/18   [provider]  SYRINGE-NEEDLE, DISP, 3 ML 25G X 5/8"  3 ML MISC 1,000 mcg by Misc.(Non-Drug; Combo Route) route every 30 days. 12/17/18   [provider]  triamcinolone (KENALOG) 0.1 % paste Apply to mouth as needed. 08/09/18   [provider]  Vitamin D, Ergocalciferol, (DRISDOL) 1.25 MG (50000 UT) CAPS capsule  12/12/18   [provider]      Allergies    Ciprofloxacin, Neomycin-bacitracin zn-polymyx, Shellfish allergy, Shellfish-derived products, Bactrim [sulfamethoxazole-trimethoprim], Oxycodone, Percocet [oxycodone-acetaminophen], Amoxicillin, Latex, Miconazole nitrate, Oxycodone-acetaminophen, Penicillins, Sulfa antibiotics, and Sulfasalazine    Review of Systems   Review of Systems  Gastrointestinal:  Positive for abdominal pain.   Physical Exam Updated Vital Signs BP 120/79 (BP Location: Right Arm)   Pulse 62   Temp 98.1 F (36.7 C) (Oral)   Resp 20   Ht '5\' 9"'$  (1.753 m)   Wt 124.7 kg   SpO2 100%   BMI 40.61 kg/m  Physical Exam  ED Results / Procedures / Treatments   Labs (all labs ordered are listed, but only abnormal results are displayed) Labs Reviewed  LIPASE, BLOOD  COMPREHENSIVE METABOLIC PANEL  CBC  URINALYSIS, ROUTINE W REFLEX MICROSCOPIC  I-STAT BETA HCG BLOOD, ED ([facility], [facility], [facility] ONLY)    EKG None  Radiology No results found.  Procedures Procedures  {Document cardiac monitor, telemetry assessment procedure when appropriate:1}  Medications Ordered in ED Medications - No data to display  ED Course/ Medical Decision Making/ A&P  Medical Decision Making Amount and/or Complexity of Data Reviewed Labs: ordered.   ***  {Document critical care time when appropriate:1} {Document review of labs and clinical decision tools ie heart score, Chads2Vasc2 etc:1}  {Document your independent review of radiology images, and any outside records:1} {Document your discussion with family members, caretakers, and with consultants:1} {Document social determinants of health  affecting pt's care:1} {Document your decision making why or why not admission, treatments were needed:1} Final Clinical Impression(s) / ED Diagnoses Final diagnoses:  None    Rx / DC Orders ED Discharge Orders     None

## 2021-12-28 NOTE — Discharge Instructions (Signed)
As we discussed, your work-up in the ER today was reassuring for acute abnormalities.  I recommend that you go to your OB/GYN appointment later today for further evaluation and management of your symptoms.  I have given you a prescription for Zofran to take as prescribed as needed for nausea.

## 2021-12-28 NOTE — ED Provider Notes (Signed)
[facility] EMERGENCY DEPARTMENT Provider Note   CSN: [id number] Arrival date & time: 12/28/21  1117     History  Chief Complaint  Patient presents with   Abdominal Pain    Robin Key is a 36 y.o. female.  Patient with history of DVT, Factor V Leiden, and cholecystitis presents today with complaints of abdominal pain. She states that same has been located in the RLQ and has been present for the past 4 days with worsening today. She states that her pain is now radiating to her back and into her shoulder as well. She endorses history of cholecystectomy in 2016. Also states that she has a history of PCOS and has an IUD in place. She states that she has felt the strings since she developed this pain because she was concerned her symptoms were related to a displaced IUD. She states that her last menstrual cycle was in January, however she endorses a history of irregular menstrual cycles. States that 3-4 weeks ago she had a episode of 1 day of vaginal bleeding followed by several weeks of dark red colored discharge that she describes as looking like 'old blood.' She states that this stopped yesterday and she has not had any episodes of this today. She denies any other unusual vaginal discharge or painful intercourse. Last BM was earlier today and was normal for her. She denies some nausea without vomiting or diarrhea. No hematuria or dysuria.  The history is provided by the patient. No language interpreter was used.  Abdominal Pain Associated symptoms: no chills and no fever       Home Medications Prior to Admission medications   Medication Sig Start Date End Date Taking? Authorizing Provider  acyclovir (ZOVIRAX) 400 MG tablet  11/13/18   [provider]  butalbital-acetaminophen-caffeine (FIORICET, ESGIC) 50-325-40 MG per tablet Take 2 tablets by mouth every 6 (six) hours as needed for headache. 11/06/13   Wyatt Portela, MD  cephALEXin (KEFLEX) 500 MG capsule  Take 1 capsule (500 mg total) by mouth 2 (two) times daily. 08/09/21   Wyatt Portela, MD  cetirizine (ZYRTEC) 10 MG tablet Take 10 mg by mouth daily.    [provider]  diclofenac (VOLTAREN) 75 MG EC tablet TAKE 1 TABLET(75 MG) BY MOUTH TWICE DAILY 05/03/18   Landis Martins, DPM  fluticasone (FLONASE) 50 MCG/ACT nasal spray Place 2 sprays into both nostrils daily.    [provider]  HYDROcodone-acetaminophen (NORCO) 7.5-325 MG tablet Take 1 tablet by mouth every 6 (six) hours as needed for moderate pain. 06/18/19   Landis Martins, DPM  meloxicam (MOBIC) 15 MG tablet Take 1 tablet (15 mg total) by mouth daily. 08/21/17   Evelina Bucy, DPM  ondansetron (ZOFRAN-ODT) 8 MG disintegrating tablet Take 1 tablet by mouth 3 (three) times daily as needed. 10/13/15   [provider]  pantoprazole (PROTONIX) 40 MG tablet Take by mouth. 11/13/18   [provider]  rivaroxaban (XARELTO) 20 MG TABS tablet Take 1 tablet (20 mg total) by mouth daily. 12/16/21   Wyatt Portela, MD  SUMAtriptan (IMITREX) 50 MG tablet  10/25/18   [provider]  SYRINGE-NEEDLE, DISP, 3 ML 25G X 5/8" 3 ML MISC 1,000 mcg by Misc.(Non-Drug; Combo Route) route every 30 days. 12/17/18   [provider]  triamcinolone (KENALOG) 0.1 % paste Apply to mouth as needed. 08/09/18   [provider]  Vitamin D, Ergocalciferol, (DRISDOL) 1.25 MG (50000 UT) CAPS capsule  12/12/18   [provider]      Allergies    Ciprofloxacin, Neomycin-bacitracin zn-polymyx, Shellfish allergy, Shellfish-derived products, Bactrim [sulfamethoxazole-trimethoprim], Oxycodone, Percocet [oxycodone-acetaminophen], Amoxicillin, Latex, Miconazole nitrate, Oxycodone-acetaminophen, Penicillins, Sulfa antibiotics, and Sulfasalazine    Review of Systems   Review of Systems  Constitutional:  Negative for chills and fever.  Gastrointestinal:  Positive for abdominal pain.  All other systems reviewed and are  negative.  Physical Exam Updated Vital Signs BP 120/79 (BP Location: Right Arm)   Pulse 62   Temp 98.1 F (36.7 C) (Oral)   Resp 20   Ht '5\' 9"'$  (1.753 m)   Wt 124.7 kg   SpO2 100%   BMI 40.61 kg/m  Physical Exam Vitals and nursing note reviewed.  Constitutional:      General: She is not in acute distress.    Appearance: Normal appearance. She is normal weight. She is not ill-appearing, toxic-appearing or diaphoretic.  HENT:     Head: Normocephalic and atraumatic.  Cardiovascular:     Rate and Rhythm: Normal rate.  Pulmonary:     Effort: Pulmonary effort is normal. No respiratory distress.  Abdominal:     General: Abdomen is flat.     Palpations: Abdomen is soft.     Tenderness: There is abdominal tenderness in the right lower quadrant.  Musculoskeletal:        General: Normal range of motion.     Cervical back: Normal range of motion.  Skin:    General: Skin is warm and dry.  Neurological:     General: No focal deficit present.     Mental Status: She is alert.  Psychiatric:        Mood and Affect: Mood normal.        Behavior: Behavior normal.    ED Results / Procedures / Treatments   Labs (all labs ordered are listed, but only abnormal results are displayed) Labs Reviewed  URINALYSIS, ROUTINE W REFLEX MICROSCOPIC - Abnormal; Notable for the following components:      Result Value   Color, Urine STRAW (*)    All other components within normal limits  LIPASE, BLOOD  COMPREHENSIVE METABOLIC PANEL  CBC  I-STAT BETA HCG BLOOD, ED ([facility], [facility], [facility] ONLY)    EKG None  Radiology CT ABDOMEN PELVIS W CONTRAST  Result Date: 12/28/2021 CLINICAL DATA:  Right lower quadrant abdominal pain. Started 3 days ago. EXAM: CT ABDOMEN AND PELVIS WITH CONTRAST TECHNIQUE: Multidetector CT imaging of the abdomen and pelvis was performed using the standard protocol following bolus administration of intravenous contrast. RADIATION DOSE REDUCTION: This exam was performed according to the  departmental dose-optimization program which includes automated exposure control, adjustment of the mA and/or kV according to patient size and/or use of iterative reconstruction technique. CONTRAST:  168m OMNIPAQUE IOHEXOL 300 MG/ML  SOLN COMPARISON:  CT examination dated March 11, 2019 FINDINGS: Lower chest: No acute abnormality. Hepatobiliary: No focal liver abnormality is seen. Status post cholecystectomy. No biliary dilatation. Pancreas: Unremarkable. No pancreatic ductal dilatation or surrounding inflammatory changes. Spleen: Normal in size without focal abnormality. Adrenals/Urinary Tract: Adrenal glands are unremarkable. Kidneys are normal, without renal calculi, focal lesion, or hydronephrosis. Bladder is unremarkable. Stomach/Bowel: Stomach is within normal limits. Appendix appears normal. No evidence of bowel wall thickening, distention, or inflammatory changes. Vascular/Lymphatic: No significant vascular findings are present. No enlarged abdominal or pelvic lymph nodes. Reproductive: Uterus and bilateral adnexa are unremarkable. Intrauterine contraceptive device in satisfactory position. Other: No abdominal wall hernia or  abnormality. No abdominopelvic ascites. Musculoskeletal: Mild degenerate disc disease of the lumbar spine prominent at L5-S1. IMPRESSION: 1.  No CT evidence of acute abdominal/pelvic process. 2.  Status post cholecystectomy.  No biliary ductal dilatation. 3.  Normal appendix.  No evidence of colitis or diverticulitis. 4.  No evidence of nephrolithiasis or hydronephrosis. 5.  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 Electronically Signed   By: Keane Police D.O.   On: 12/28/2021 15:16    Procedures Procedures    Medications Ordered in ED Medications - No data to display  ED Course/ Medical Decision Making/ A&P                           Medical Decision Making Amount and/or Complexity of Data Reviewed Labs: ordered. Radiology: ordered.   This patient presents to  the ED for concern of RLQ abdominal pain, this involves an extensive number of treatment options, and is a complaint that carries with it a high risk of complications and morbidity.  The differential diagnosis includes appendicitis, ruptured ovarian cyst. This is not an exhaustive differential   Co morbidities that complicate the patient evaluation  Hx Factor V Leiden   Lab Tests:  I Ordered, and personally interpreted labs.  The pertinent results include:  no acute laboratory findings   Imaging Studies ordered:  I ordered imaging studies including CT abdomen pelvis with contrast  I independently visualized and interpreted imaging which showed  1.  No CT evidence of acute abdominal/pelvic process.  2.  Status post cholecystectomy.  No biliary ductal dilatation.  3.  Normal appendix.  No evidence of colitis or diverticulitis.  4.  No evidence of nephrolithiasis or hydronephrosis.  5.  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  I agree with the radiologist interpretation   Problem List / ED Course / Critical interventions / Medication management  I ordered medication including zofran and fluids  for nausea, dehydration  Reevaluation of the patient after these medicines showed that the patient improved I have reviewed the patients home medicines and have made adjustments as needed   Test / Admission - Considered:  Patient is nontoxic, nonseptic appearing, in no apparent distress.  Patient's pain and other symptoms adequately managed in emergency department.  Fluid bolus given.  Labs, imaging and vitals reviewed.  Patient does not meet the SIRS or Sepsis criteria.  On repeat exam patient does not have a surgical abdomin and there are no peritoneal signs.  No indication of appendicitis, bowel obstruction, bowel perforation, cholecystitis, diverticulitis, PID or ectopic pregnancy.  Patient has an appointment with her OB/GYN at 4 pm today for further evaluation and management of her  symptoms. Will recommend that she go to this appointment as soon as she leave here. No further emergent concerns at this time. Patient discharged home with Zofran and given strict instructions for follow-up with their primary care physician.  I have also discussed reasons to return immediately to the ER.  Patient expresses understanding and agrees with plan. Discharged in stable condition.   Final Clinical Impression(s) / ED Diagnoses Final diagnoses:  Right lower quadrant abdominal pain    Rx / DC Orders ED Discharge Orders          Ordered    ondansetron (ZOFRAN-ODT) 4 MG disintegrating tablet  Every 8 hours PRN        12/28/21 1542          An After Visit  Summary was printed and given to the patient.     Nestor Lewandowsky 12/28/21 1543    Lucrezia Starch, MD 12/29/21 [phone]

## 2021-12-28 NOTE — ED Notes (Signed)
Pt reports having R sided pelvic pain x7 days with N. No V/D, dysuria, or hematuria. Last MB 12/28/2021 and was normal. Pt has bee having brown colored discharge for several weeks and has appt with OB later today. Pt reports to ED because pain is worse and not radiating to R flank and R shoulder. No injury. ABD tender in RLQ and lower epigastric region. Pt passing flatus.

## 2021-12-28 NOTE — ED Triage Notes (Signed)
Pt states she has had right sided groin pain for a week. Pt states pain is now right flank into her right back. Also into right shoulder. Pt complains of nausea as well.

## 2022-01-06 ENCOUNTER — Encounter: Payer: Self-pay | Admitting: Oncology

## 2022-01-11 ENCOUNTER — Encounter: Payer: Self-pay | Admitting: Oncology

## 2022-02-15 ENCOUNTER — Inpatient Hospital Stay: Payer: Commercial Managed Care - HMO | Attending: Oncology

## 2022-02-15 ENCOUNTER — Inpatient Hospital Stay (HOSPITAL_BASED_OUTPATIENT_CLINIC_OR_DEPARTMENT_OTHER): Payer: Commercial Managed Care - HMO | Admitting: Oncology

## 2022-02-15 ENCOUNTER — Other Ambulatory Visit: Payer: Self-pay

## 2022-02-15 VITALS — BP 124/79 | HR 75 | Temp 97.7°F | Resp 16 | Ht 69.0 in | Wt 278.8 lb

## 2022-02-15 DIAGNOSIS — N920 Excessive and frequent menstruation with regular cycle: Secondary | ICD-10-CM | POA: Diagnosis present

## 2022-02-15 DIAGNOSIS — D500 Iron deficiency anemia secondary to blood loss (chronic): Secondary | ICD-10-CM

## 2022-02-15 DIAGNOSIS — D6851 Activated protein C resistance: Secondary | ICD-10-CM | POA: Diagnosis present

## 2022-02-15 DIAGNOSIS — Z7901 Long term (current) use of anticoagulants: Secondary | ICD-10-CM | POA: Diagnosis not present

## 2022-02-15 LAB — CBC WITH DIFFERENTIAL (CANCER CENTER ONLY)
Abs Immature Granulocytes: 0.02 10*3/uL (ref 0.00–0.07)
Basophils Absolute: 0 10*3/uL (ref 0.0–0.1)
Basophils Relative: 1 %
Eosinophils Absolute: 0.1 10*3/uL (ref 0.0–0.5)
Eosinophils Relative: 1 %
HCT: 41.2 % (ref 36.0–46.0)
Hemoglobin: 13.7 g/dL (ref 12.0–15.0)
Immature Granulocytes: 0 %
Lymphocytes Relative: 18 %
Lymphs Abs: 1.2 10*3/uL (ref 0.7–4.0)
MCH: 29.3 pg (ref 26.0–34.0)
MCHC: 33.3 g/dL (ref 30.0–36.0)
MCV: 88 fL (ref 80.0–100.0)
Monocytes Absolute: 0.3 10*3/uL (ref 0.1–1.0)
Monocytes Relative: 5 %
Neutro Abs: 5 10*3/uL (ref 1.7–7.7)
Neutrophils Relative %: 75 %
Platelet Count: 275 10*3/uL (ref 150–400)
RBC: 4.68 MIL/uL (ref 3.87–5.11)
RDW: 12.4 % (ref 11.5–15.5)
WBC Count: 6.6 10*3/uL (ref 4.0–10.5)
nRBC: 0 % (ref 0.0–0.2)

## 2022-02-15 LAB — IRON AND IRON BINDING CAPACITY (CC-WL,HP ONLY)
Iron: 57 ug/dL (ref 28–170)
Saturation Ratios: 19 % (ref 10.4–31.8)
TIBC: 294 ug/dL (ref 250–450)
UIBC: 237 ug/dL

## 2022-02-15 NOTE — Progress Notes (Signed)
Hematology and Oncology Follow Up Visit  Robin Key [id number] 30-Mar-1986 36 y.o. 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  She was found to have factor V Leiden mutation she had developed superficial thrombophlebitis in 2022.   Secondary diagnosis: Iron deficiency anemia diagnosed in 2012.  This is related to related to menstrual blood losses.      Prior Therapy:   Full dose anticoagulation with Lovenox after she had a C-section on 07/01/2013.   She is status post IV iron infusion as needed.  Last treatment was in November 2021.  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 She was switched to Lovenox in June 2018.  Therapy discontinued in March 2019.  Xarelto 20 mg daily started in June 2019.  She has not been taking it leading up to December 2022.  Current therapy:     Xarelto 15 mg twice daily started in December 2022 due to superficial thrombophlebitis recurrence.  She is currently taking Xarelto 20 mg daily.   Interim History:  [name] is here for follow-up visit.  Since the last visit, she reports no major changes in her health.  She did have an episode of abdominal pain in May 2023 and was evaluated emergency department.  CT scan of the abdomen and pelvis showed no abnormalities.  She denies any hematochezia or melena or hemoptysis.  Her abdominal pain has resolved at this time.  She does have very little bleeding is of her IUD.     Medications: Reviewed without changes. Current Outpatient Medications  Medication Sig Dispense Refill   acyclovir (ZOVIRAX) 400 MG tablet      butalbital-acetaminophen-caffeine (FIORICET, ESGIC) 50-325-40 MG per tablet Take 2 tablets by mouth every 6 (six) hours as needed for headache. 30 tablet 1   cephALEXin (KEFLEX) 500 MG capsule Take 1 capsule  (500 mg total) by mouth 2 (two) times daily. 20 capsule 0   cetirizine (ZYRTEC) 10 MG tablet Take 10 mg by mouth daily.     diclofenac (VOLTAREN) 75 MG EC tablet TAKE 1 TABLET(75 MG) BY MOUTH TWICE DAILY 30 tablet 0   fluticasone (FLONASE) 50 MCG/ACT nasal spray Place 2 sprays into both nostrils daily.     HYDROcodone-acetaminophen (NORCO) 7.5-325 MG tablet Take 1 tablet by mouth every 6 (six) hours as needed for moderate pain. 21 tablet 0   meloxicam (MOBIC) 15 MG tablet Take 1 tablet (15 mg total) by mouth daily. 30 tablet 0   ondansetron (ZOFRAN-ODT) 4 MG disintegrating tablet Take 1 tablet (4 mg total) by mouth every 8 (eight) hours as needed for nausea or vomiting. 20 tablet 0   pantoprazole (PROTONIX) 40 MG tablet Take by mouth.     rivaroxaban (XARELTO) 20 MG TABS tablet Take 1 tablet (20 mg total) by mouth daily. 90 tablet 0   SUMAtriptan (IMITREX) 50 MG tablet      SYRINGE-NEEDLE, DISP, 3 ML 25G X 5/8" 3 ML MISC 1,000 mcg by Misc.(Non-Drug; Combo Route) route every 30 days.     triamcinolone (KENALOG) 0.1 % paste Apply to mouth as needed.     Vitamin D, Ergocalciferol, (DRISDOL) 1.25 MG (50000 UT) CAPS capsule      Current Facility-Administered Medications  Medication Dose Route Frequency Provider Last Rate Last Admin   triamcinolone acetonide (KENALOG) 10 MG/ML injection 10 mg  10 mg Other Once Landis Martins, DPM  triamcinolone acetonide (KENALOG-40) injection 20 mg  20 mg Other Once Landis Martins, DPM       triamcinolone acetonide (KENALOG-40) injection 20 mg  20 mg Other Once Landis Martins, DPM       triamcinolone acetonide (KENALOG-40) injection 20 mg  20 mg Other Once Landis Martins, DPM       triamcinolone acetonide (KENALOG-40) injection 20 mg  20 mg Other Once Landis Martins, DPM         Allergies:  Allergies  Allergen Reactions   Ciprofloxacin Other (See Comments)    Felt like "bugs crawling" when on Lovenox   Neomycin-Bacitracin Zn-Polymyx Rash    Shellfish Allergy Rash and Swelling    Shrimp    Shellfish-Derived Products Swelling    Shrimp Shrimp   Bactrim [Sulfamethoxazole-Trimethoprim] Other (See Comments)    headache Headache    Oxycodone Nausea And Vomiting   Percocet [Oxycodone-Acetaminophen] Nausea And Vomiting   Amoxicillin Rash   Latex Rash   Miconazole Nitrate Rash   Oxycodone-Acetaminophen Nausea And Vomiting   Penicillins Rash   Sulfa Antibiotics Rash   Sulfasalazine Rash      Physical Exam:         Blood pressure 124/79, pulse 75, temperature 97.7 F (36.5 C), temperature source Temporal, resp. rate 16, height '5\' 9"'$  (1.753 m), weight 278 lb 12.8 oz (126.5 kg), SpO2 99 %, unknown if currently breastfeeding.     ECOG: 0   General appearance: Alert, awake without any distress. Head: Atraumatic without abnormalities Oropharynx: Without any thrush or ulcers. Eyes: No scleral icterus. Lymph nodes: No lymphadenopathy noted in the cervical, supraclavicular, or axillary nodes Heart:regular rate and rhythm, without any murmurs or gallops.   Lung: Clear to auscultation without any rhonchi, wheezes or dullness to percussion. Abdomin: Soft, nontender without any shifting dullness or ascites. Musculoskeletal: No clubbing or cyanosis. Neurological: No motor or sensory deficits. Skin: No rashes or lesions.    .              Lab Results: Lab Results  Component Value Date   WBC 6.4 12/28/2021   HGB 14.1 12/28/2021   HCT 41.5 12/28/2021   MCV 89.8 12/28/2021   PLT 221 12/28/2021     Chemistry      Component Value Date/Time   NA 139 12/28/2021 1145   NA 139 08/27/2015 1411   K 3.9 12/28/2021 1145   K 3.9 08/27/2015 1411   CL 106 12/28/2021 1145   CO2 27 12/28/2021 1145   CO2 28 08/27/2015 1411   BUN 8 12/28/2021 1145   BUN 11.5 08/27/2015 1411   CREATININE 0.81 12/28/2021 1145   CREATININE 0.9 08/27/2015 1411      Component Value Date/Time   CALCIUM 9.2 12/28/2021 1145    CALCIUM 9.7 08/27/2015 1411   ALKPHOS 86 12/28/2021 1145   ALKPHOS 100 08/27/2015 1411   AST 15 12/28/2021 1145   AST 17 08/27/2015 1411   ALT 18 12/28/2021 1145   ALT 18 08/27/2015 1411   BILITOT 0.8 12/28/2021 1145   BILITOT 0.79 08/27/2015 1411     Iron/TIBC/Ferritin/ %Sat    Component Value Date/Time   IRON 57 12/14/2021 1024   IRON 72 03/14/2017 1510   TIBC 325 12/14/2021 1024   TIBC 263 03/14/2017 1510   FERRITIN 86 12/14/2021 1025   FERRITIN 321 (H) 03/14/2017 1510   IRONPCTSAT 18 12/14/2021 1024   IRONPCTSAT 28 03/14/2017 1510    Impression and Plan:  36 year old woman with:  1.  Factor V Leiden mutation resulting in recurrent thromboembolism diagnosed in 2012.  She is currently on Xarelto without any recurrent bleeding or thrombosis.  Long-term complications including increased bleeding risk were reviewed.  He is agreeable to continue at this time.  2.  Iron deficiency anemia: CBC obtained in May 2023 showed normal level with ferritin of 86 at that time.  Iron studies are currently pending and will communicate these findings to her once these are available.  IV iron infusion can be used in future if needed.  3.  Heavy menstrual cycles: Manageable with IUD at this time.  She has very little bleeding left.  4.  Superficial thrombophlebitis with cellulitis: Resolved at this time.  5.  Follow-up: She will return in 3 months for follow-up.  30  minutes were dedicated to this encounter.  The time was spent on reviewing laboratory data, disease status update and outlining future plan of care discussion.  Zola Button, MD 02/15/22

## 2022-02-16 ENCOUNTER — Telehealth: Payer: Self-pay | Admitting: *Deleted

## 2022-02-16 LAB — FERRITIN: Ferritin: 102 ng/mL (ref 11–307)

## 2022-02-16 NOTE — Telephone Encounter (Signed)
-----   Message from Wyatt Portela, MD sent at 02/16/2022  8:43 AM EDT ----- Please let her know her iron is still good. No need for IV iron

## 2022-02-16 NOTE — Telephone Encounter (Signed)
PC to patient, informed her of Dr. Hazeline Junker message as below.  She verbalizes understanding.

## 2022-03-02 ENCOUNTER — Telehealth: Payer: Self-pay | Admitting: Oncology

## 2022-03-02 NOTE — Telephone Encounter (Signed)
Called patient regarding upcoming appointments, patient does not have voicemail set up. Calender will be mailed.

## 2022-03-07 ENCOUNTER — Other Ambulatory Visit: Payer: Self-pay | Admitting: Oncology

## 2022-03-07 DIAGNOSIS — D6851 Activated protein C resistance: Secondary | ICD-10-CM

## 2022-04-03 ENCOUNTER — Telehealth: Payer: Self-pay

## 2022-04-03 NOTE — Telephone Encounter (Signed)
Per Elouise Munroe approval, Rx called to [facility] for Xarelto 20 mg #90 One po daily  No refills.

## 2022-04-03 NOTE — Telephone Encounter (Signed)
T/C from pt stating she needs a prescription sent (through [employer] drug assistant program) to [facility] for her Xarelto 20 mg.  She needs a prescription sent today because she took her last one last night.  She said the pharmacy has been trying to reach [place] and has faxed a request for the rx.  I advised pt we have not received a request or a telephone call.  I spoke with [facility] [phone]) and they have been faxing to the wrong fax number.  I gave them the correct number (twice) but have not received a fax for the prescription.    They are asking for a verbal order.  Please advise

## 2022-05-04 ENCOUNTER — Other Ambulatory Visit: Payer: Self-pay | Admitting: Specialist

## 2022-05-04 DIAGNOSIS — R197 Diarrhea, unspecified: Secondary | ICD-10-CM

## 2022-05-05 ENCOUNTER — Emergency Department (HOSPITAL_COMMUNITY)
Admission: EM | Admit: 2022-05-05 | Discharge: 2022-05-06 | Disposition: A | Discharge status: Home / Self Care | Payer: Commercial Managed Care - HMO | Attending: Emergency Medicine | Admitting: Emergency Medicine

## 2022-05-05 ENCOUNTER — Emergency Department (HOSPITAL_COMMUNITY): Payer: Commercial Managed Care - HMO

## 2022-05-05 ENCOUNTER — Encounter (HOSPITAL_COMMUNITY): Payer: Self-pay

## 2022-05-05 ENCOUNTER — Other Ambulatory Visit: Payer: Self-pay

## 2022-05-05 ENCOUNTER — Ambulatory Visit (HOSPITAL_COMMUNITY)
Admission: RE | Admit: 2022-05-05 | Discharge: 2022-05-05 | Disposition: A | Discharge status: Home / Self Care | Payer: Commercial Managed Care - HMO | Source: Ambulatory Visit | Attending: Specialist | Admitting: Specialist

## 2022-05-05 ENCOUNTER — Encounter (HOSPITAL_COMMUNITY): Payer: Self-pay | Admitting: Emergency Medicine

## 2022-05-05 ENCOUNTER — Encounter: Payer: Self-pay | Admitting: Oncology

## 2022-05-05 DIAGNOSIS — K529 Noninfective gastroenteritis and colitis, unspecified: Secondary | ICD-10-CM | POA: Diagnosis not present

## 2022-05-05 DIAGNOSIS — Z91040 Latex allergy status: Secondary | ICD-10-CM | POA: Diagnosis not present

## 2022-05-05 DIAGNOSIS — Z79899 Other long term (current) drug therapy: Secondary | ICD-10-CM | POA: Diagnosis not present

## 2022-05-05 DIAGNOSIS — R1031 Right lower quadrant pain: Secondary | ICD-10-CM | POA: Diagnosis present

## 2022-05-05 DIAGNOSIS — R197 Diarrhea, unspecified: Secondary | ICD-10-CM

## 2022-05-05 LAB — COMPREHENSIVE METABOLIC PANEL
ALT: 19 U/L (ref 0–44)
AST: 14 U/L — ABNORMAL LOW (ref 15–41)
Albumin: 4.3 g/dL (ref 3.5–5.0)
Alkaline Phosphatase: 91 U/L (ref 38–126)
Anion gap: 8 (ref 5–15)
BUN: 9 mg/dL (ref 6–20)
CO2: 25 mmol/L (ref 22–32)
Calcium: 9.3 mg/dL (ref 8.9–10.3)
Chloride: 105 mmol/L (ref 98–111)
Creatinine, Ser: 0.9 mg/dL (ref 0.44–1.00)
GFR, Estimated: >60 mL/min (ref >60)
Glucose, Bld: 96 mg/dL (ref 70–99)
Potassium: 3.8 mmol/L (ref 3.5–5.1)
Sodium: 138 mmol/L (ref 135–145)
Total Bilirubin: 1 mg/dL (ref 0.3–1.2)
Total Protein: 7.3 g/dL (ref 6.5–8.1)

## 2022-05-05 LAB — URINALYSIS, ROUTINE W REFLEX MICROSCOPIC
Bilirubin Urine: NEGATIVE
Glucose, UA: NEGATIVE mg/dL
Hgb urine dipstick: NEGATIVE
Ketones, ur: NEGATIVE mg/dL
Leukocytes,Ua: NEGATIVE
Nitrite: NEGATIVE
Protein, ur: NEGATIVE mg/dL
Specific Gravity, Urine: 1.011 (ref 1.005–1.030)
pH: 5 (ref 5.0–8.0)

## 2022-05-05 LAB — CBC WITH DIFFERENTIAL/PLATELET
Abs Immature Granulocytes: 0.03 10*3/uL (ref 0.00–0.07)
Basophils Absolute: 0.1 10*3/uL (ref 0.0–0.1)
Basophils Relative: 1 %
Eosinophils Absolute: 0.1 10*3/uL (ref 0.0–0.5)
Eosinophils Relative: 1 %
HCT: 44 % (ref 36.0–46.0)
Hemoglobin: 14.6 g/dL (ref 12.0–15.0)
Immature Granulocytes: 0 %
Lymphocytes Relative: 17 %
Lymphs Abs: 1.5 10*3/uL (ref 0.7–4.0)
MCH: 29.7 pg (ref 26.0–34.0)
MCHC: 33.2 g/dL (ref 30.0–36.0)
MCV: 89.6 fL (ref 80.0–100.0)
Monocytes Absolute: 0.6 10*3/uL (ref 0.1–1.0)
Monocytes Relative: 7 %
Neutro Abs: 6.6 10*3/uL (ref 1.7–7.7)
Neutrophils Relative %: 74 %
Platelets: 261 10*3/uL (ref 150–400)
RBC: 4.91 MIL/uL (ref 3.87–5.11)
RDW: 13.1 % (ref 11.5–15.5)
WBC: 8.8 10*3/uL (ref 4.0–10.5)
nRBC: 0 % (ref 0.0–0.2)

## 2022-05-05 LAB — LACTIC ACID, PLASMA: Lactic Acid, Venous: 1.2 mmol/L (ref 0.5–1.9)

## 2022-05-05 LAB — PREGNANCY, URINE: Preg Test, Ur: NEGATIVE

## 2022-05-05 LAB — LIPASE, BLOOD: Lipase: 26 U/L (ref 11–51)

## 2022-05-05 MED ORDER — ONDANSETRON HCL 4 MG/2ML IJ SOLN
4.0000 mg | Freq: Once | INTRAMUSCULAR | Status: AC
  Administered 2022-05-05: 4 mg via INTRAVENOUS
  Filled 2022-05-05: qty 2

## 2022-05-05 MED ORDER — METRONIDAZOLE 500 MG/100ML IV SOLN
500.0000 mg | Freq: Two times a day (BID) | INTRAVENOUS | Status: DC
  Administered 2022-05-05: 500 mg via INTRAVENOUS
  Filled 2022-05-05: qty 100

## 2022-05-05 MED ORDER — IOHEXOL 350 MG/ML SOLN
100.0000 mL | Freq: Once | INTRAVENOUS | Status: AC | PRN
  Administered 2022-05-05: 100 mL via INTRAVENOUS

## 2022-05-05 MED ORDER — METRONIDAZOLE 500 MG PO TABS: 500.0000 mg | ORAL_TABLET | Freq: Two times a day (BID) | ORAL | 0 refills | Status: DC

## 2022-05-05 MED ORDER — KETOROLAC TROMETHAMINE 30 MG/ML IJ SOLN
15.0000 mg | Freq: Once | INTRAMUSCULAR | Status: AC
  Administered 2022-05-05: 15 mg via INTRAVENOUS
  Filled 2022-05-05: qty 1

## 2022-05-05 MED ORDER — FLUCONAZOLE 150 MG PO TABS: 150.0000 mg | ORAL_TABLET | Freq: Every day | ORAL | 0 refills | Status: DC

## 2022-05-05 MED ORDER — ONDANSETRON 4 MG PO TBDP: 4.0000 mg | ORAL_TABLET | Freq: Three times a day (TID) | ORAL | 0 refills | Status: DC | PRN

## 2022-05-05 MED ORDER — CEFDINIR 300 MG PO CAPS: 300.0000 mg | ORAL_CAPSULE | Freq: Two times a day (BID) | ORAL | 0 refills | Status: DC

## 2022-05-05 MED ORDER — FENTANYL CITRATE PF 50 MCG/ML IJ SOSY: 50.0000 ug | PREFILLED_SYRINGE | Freq: Once | INTRAMUSCULAR | Status: DC

## 2022-05-05 MED ORDER — SODIUM CHLORIDE 0.9 % IV SOLN
1.0000 g | Freq: Once | INTRAVENOUS | Status: AC
  Administered 2022-05-05: 1 g via INTRAVENOUS
  Filled 2022-05-05: qty 10

## 2022-05-05 MED ORDER — SODIUM CHLORIDE (PF) 0.9 % IJ SOLN
INTRAMUSCULAR | Status: AC
  Filled 2022-05-05: qty 50

## 2022-05-05 MED ORDER — SODIUM CHLORIDE 0.9 % IV BOLUS
1000.0000 mL | Freq: Once | INTRAVENOUS | Status: AC
  Administered 2022-05-05: 1000 mL via INTRAVENOUS

## 2022-05-05 NOTE — ED Notes (Signed)
Pt needs pregnancy and IV for CT please

## 2022-05-05 NOTE — ED Notes (Signed)
Pt called out due to bleeding around IV catheter. RN notified and advised to remove IV.

## 2022-05-05 NOTE — ED Triage Notes (Signed)
Pt states she has had multiple issues with abdominal pain/n/v and diarrhea and has been to her PCP about this.  Pt states she had a CT ordered and this morning took 2 doses of barium, last one at 11 a.m., as instructed and presented for CT scan however the order could not be located.  Called her PCP who told her to come to the ED to have her scan done.

## 2022-05-05 NOTE — ED Provider Triage Note (Signed)
Emergency Medicine Provider Triage Evaluation Note  Robin Key , a 36 y.o. female  was evaluated in triage.  Patient has history of factor V Leiden on Xarelto, history of DVT/PE.  Pt complains of diarrhea x1 month, abdominal pain and nausea x1 week.  Patient reports she was sent into the ER by her primary care provider for CT abdomen pelvis.  She has been scheduled for a CT scan by her PCP today but she reports her PCP felt she needed ER evaluation instead.  Review of Systems  Positive: Abdominal pain, nausea/vomiting and diarrhea Negative: Fever, chills, chest pain/shortness of breath.  Physical Exam  BP (!) 143/92 (BP Location: Left Arm)   Pulse 72   Temp 98.1 F (36.7 C) (Oral)   Resp 18   SpO2 100%  Gen:   Awake, no distress   Resp:  Normal effort  MSK:   Moves extremities without difficulty  Other:  Diffuse mildly tender abdomen without peritoneal signs.  Medical Decision Making  Medically screening exam initiated at 2:46 PM.  Appropriate orders placed.  Robin Key was informed that the remainder of the evaluation will be completed by another provider, this initial triage assessment does not replace that evaluation, and the importance of remaining in the ED until their evaluation is complete.   Note: Portions of this report may have been transcribed using voice recognition software. Every effort was made to ensure accuracy; however, inadvertent computerized transcription errors may still be present.    Deliah Boston, [place] 05/05/22 1448

## 2022-05-05 NOTE — Discharge Instructions (Signed)
Take the antibiotics as prescribed.  Follow-up with your primary doctor as well as a gastroenterologist.  If you develop worsening pain, fever, vomiting, uncontrolled diarrhea, return to the ED.

## 2022-05-05 NOTE — ED Provider Notes (Signed)
[facility] DEPT Provider Note   CSN: [id number] Arrival date & time: 05/05/22  1330     History  Chief Complaint  Patient presents with   Abdominal Pain    Yolando A Digioia is a 36 y.o. female.  Patient sent from her PCP for a CT of her abdomen pelvis.  This was supposed to be done as an outpatient but there is some issue of the radiology department not having the order so she was sent to the ED.  She been having upper abdominal pain for the past week associate with nausea and vomiting and diarrhea.  States she is unable to eat because she is vomiting and unable to keep anything down.  Has had loose stools over the past several weeks that she describes as "bile" and they are yellow and green but denies any black or bloody stools.  She previously had a cholecystectomy.  Still has appendix.  Still has uterus and ovaries.  She is in the process of being referred to gastroenterology by her PCP.  No fever.  No pain with urination or blood in the urine.  No vaginal bleeding or discharge. She is on Xarelto for history of factor V Leiden deficiency.  She has been compliant with her doses other than today.  Several days ago thought she saw some streaks of blood in her emesis but none since.  No black or bloody stools.  Her abdominal pain is diffuse across her upper abdomen.  She is frustrated because the CT scan was not able to be done as an outpatient despite her drinking the barium this morning.  The history is provided by the patient.  Abdominal Pain Associated symptoms: diarrhea, nausea and vomiting   Associated symptoms: no cough, no dysuria, no fever, no hematuria and no shortness of breath        Home Medications Prior to Admission medications   Medication Sig Start Date End Date Taking? Authorizing Provider  acyclovir (ZOVIRAX) 400 MG tablet  11/13/18   [provider]  butalbital-acetaminophen-caffeine (FIORICET, ESGIC) 50-325-40 MG per tablet Take  2 tablets by mouth every 6 (six) hours as needed for headache. 11/06/13   Wyatt Portela, MD  cephALEXin (KEFLEX) 500 MG capsule Take 1 capsule (500 mg total) by mouth 2 (two) times daily. 08/09/21   Wyatt Portela, MD  cetirizine (ZYRTEC) 10 MG tablet Take 10 mg by mouth daily.    [provider]  diclofenac (VOLTAREN) 75 MG EC tablet TAKE 1 TABLET(75 MG) BY MOUTH TWICE DAILY 05/03/18   Landis Martins, DPM  fluticasone (FLONASE) 50 MCG/ACT nasal spray Place 2 sprays into both nostrils daily.    [provider]  HYDROcodone-acetaminophen (NORCO) 7.5-325 MG tablet Take 1 tablet by mouth every 6 (six) hours as needed for moderate pain. 06/18/19   Landis Martins, DPM  meloxicam (MOBIC) 15 MG tablet Take 1 tablet (15 mg total) by mouth daily. 08/21/17   Evelina Bucy, DPM  ondansetron (ZOFRAN-ODT) 4 MG disintegrating tablet Take 1 tablet (4 mg total) by mouth every 8 (eight) hours as needed for nausea or vomiting. 12/28/21   Smoot, Leary Roca, PA-C  pantoprazole (PROTONIX) 40 MG tablet Take by mouth. 11/13/18   [provider]  SUMAtriptan (IMITREX) 50 MG tablet  10/25/18   [provider]  SYRINGE-NEEDLE, DISP, 3 ML 25G X 5/8" 3 ML MISC 1,000 mcg by Misc.(Non-Drug; Combo Route) route every 30 days. 12/17/18   [provider]  triamcinolone (  KENALOG) 0.1 % paste Apply to mouth as needed. 08/09/18   [provider]  Vitamin D, Ergocalciferol, (DRISDOL) 1.25 MG (50000 UT) CAPS capsule  12/12/18   [provider]  XARELTO 20 MG TABS tablet TAKE 1 TABLET(20 MG) BY MOUTH DAILY 03/07/22   Wyatt Portela, MD      Allergies    Ciprofloxacin, Neomycin-bacitracin zn-polymyx, Shellfish allergy, Shellfish-derived products, Bactrim [sulfamethoxazole-trimethoprim], Oxycodone, Percocet [oxycodone-acetaminophen], Amoxicillin, Latex, Miconazole nitrate, Oxycodone-acetaminophen, Penicillins, Sulfa antibiotics, and Sulfasalazine    Review of Systems   Review of Systems   Constitutional:  Negative for activity change, appetite change and fever.  HENT:  Negative for congestion.   Respiratory:  Negative for cough, chest tightness and shortness of breath.   Gastrointestinal:  Positive for abdominal pain, diarrhea, nausea and vomiting.  Genitourinary:  Negative for dysuria and hematuria.  Neurological:  Negative for weakness and headaches.   all other systems are negative except as noted in the HPI and PMH.    Physical Exam Updated Vital Signs BP (!) 148/90 (BP Location: Left Arm)   Pulse (!) 55   Temp 98.1 F (36.7 C) (Oral)   Resp 18   SpO2 100%  Physical Exam Vitals and nursing note reviewed.  Constitutional:      General: She is not in acute distress.    Appearance: She is well-developed. She is obese.  HENT:     Head: Normocephalic and atraumatic.     Mouth/Throat:     Pharynx: No oropharyngeal exudate.  Eyes:     Conjunctiva/sclera: Conjunctivae normal.     Pupils: Pupils are equal, round, and reactive to light.  Neck:     Comments: No meningismus. Cardiovascular:     Rate and Rhythm: Normal rate and regular rhythm.     Heart sounds: Normal heart sounds. No murmur heard. Pulmonary:     Effort: Pulmonary effort is normal. No respiratory distress.     Breath sounds: Normal breath sounds.  Abdominal:     Palpations: Abdomen is soft.     Tenderness: There is abdominal tenderness. There is no guarding or rebound.     Comments: Diffuse tenderness, worse in epigastrium and right lower quadrant.  No guarding or rebound  Musculoskeletal:        General: No tenderness. Normal range of motion.     Cervical back: Normal range of motion and neck supple.  Skin:    General: Skin is warm.  Neurological:     Mental Status: She is alert and oriented to person, place, and time.     Cranial Nerves: No cranial nerve deficit.     Motor: No abnormal muscle tone.     Coordination: Coordination normal.     Comments:  5/5 strength throughout. CN 2-12  intact.Equal grip strength.   Psychiatric:        Behavior: Behavior normal.     ED Results / Procedures / Treatments   Labs (all labs ordered are listed, but only abnormal results are displayed) Labs Reviewed  COMPREHENSIVE METABOLIC PANEL - Abnormal; Notable for the following components:      Result Value   AST 14 (*)    All other components within normal limits  C DIFFICILE QUICK SCREEN W PCR REFLEX    GASTROINTESTINAL PANEL BY PCR, STOOL (REPLACES STOOL CULTURE)  CBC WITH DIFFERENTIAL/PLATELET  LIPASE, BLOOD  URINALYSIS, ROUTINE W REFLEX MICROSCOPIC  PREGNANCY, URINE  LACTIC ACID, PLASMA  LACTIC ACID, PLASMA    EKG None  Radiology  CT Abdomen Pelvis W Contrast  Result Date: 05/05/2022 CLINICAL DATA:  Abdominal pain and nausea x1 week. EXAM: CT ABDOMEN AND PELVIS WITH CONTRAST TECHNIQUE: Multidetector CT imaging of the abdomen and pelvis was performed using the standard protocol following bolus administration of intravenous contrast. RADIATION DOSE REDUCTION: This exam was performed according to the departmental dose-optimization program which includes automated exposure control, adjustment of the mA and/or kV according to patient size and/or use of iterative reconstruction technique. CONTRAST:  161m OMNIPAQUE IOHEXOL 350 MG/ML SOLN COMPARISON:  Dec 28, 2021 FINDINGS: Lower chest: No acute abnormality. Hepatobiliary: No focal liver abnormality is seen. Status post cholecystectomy. No biliary dilatation. Pancreas: Unremarkable. No pancreatic ductal dilatation or surrounding inflammatory changes. Spleen: Normal in size without focal abnormality. Adrenals/Urinary Tract: Adrenal glands are unremarkable. Kidneys are normal, without renal calculi, focal lesion, or hydronephrosis. Urinary bladder is poorly distended and subsequently limited in evaluation. Stomach/Bowel: Stomach is within normal limits. Appendix appears normal. No evidence of bowel dilatation. The sigmoid colon is mildly  thickened and poorly distended. Vascular/Lymphatic: No significant vascular findings are present. No enlarged abdominal or pelvic lymph nodes. Reproductive: An IUD is properly placed within an otherwise normal appearing uterus. The right adnexa is unremarkable. A 3.2 cm x 2.1 cm x 3.0 cm simple cyst is seen within the left ovary. Other: A 13 mm x 8 mm fat containing umbilical hernia is seen. No abdominopelvic ascites. Musculoskeletal: No acute or significant osseous findings. IMPRESSION: 1. Findings which may represent mild colitis involving the sigmoid colon. 2. 3.2 cm x 2.1 cm x 3.0 cm simple left ovarian cyst. No additional follow-up imaging is recommended. This recommendation follows ACR consensus guidelines: White Paper of the ACR Incidental Findings Committee II on Adnexal Findings. J Am Coll Radiol 2013:10:675-681. 3. Evidence of prior cholecystectomy. 4. Small, fat-containing umbilical hernia. Electronically Signed   By: TVirgina NorfolkM.D.   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 EXAM: CT ANGIOGRAPHY CHEST WITH CONTRAST TECHNIQUE: Multidetector CT imaging of the chest was performed using the standard protocol during bolus administration of intravenous contrast. Multiplanar CT image reconstructions and MIPs were obtained to evaluate the vascular anatomy. RADIATION DOSE REDUCTION: This exam was performed according to the departmental dose-optimization program which includes automated exposure control, adjustment of the mA and/or kV according to patient size and/or use of iterative reconstruction technique. CONTRAST:  1016mOMNIPAQUE IOHEXOL 350 MG/ML SOLN COMPARISON:  March 01, 2014 FINDINGS: Cardiovascular: Satisfactory opacification of the pulmonary arteries to the segmental level. No evidence of pulmonary embolism. Normal heart size. No pericardial effusion. Mediastinum/Nodes: No  enlarged mediastinal, hilar, or axillary lymph nodes. Thyroid gland, trachea, and esophagus demonstrate no significant findings. Lungs/Pleura: Lungs are clear. No pleural effusion or pneumothorax. Upper Abdomen: Multiple surgical clips are seen within the gallbladder fossa. Musculoskeletal: No chest wall abnormality. No acute or significant osseous findings. Review of the MIP images confirms the above findings. IMPRESSION: 1. No evidence of pulmonary embolism or other acute intrathoracic process. 2. Evidence of prior cholecystectomy. Electronically Signed   By: ThVirgina Norfolk.D.   On: 05/05/2022 20:05    Procedures Procedures    Medications Ordered in ED Medications  sodium chloride 0.9 % bolus 1,000 mL (has no administration in time range)  ondansetron (ZOFRAN) injection 4 mg (has no administration in time range)    ED Course/ Medical Decision Making/ A&P  Medical Decision Making Amount and/or Complexity of Data Reviewed Labs: ordered. Decision-making details documented in ED Course. Radiology: ordered and independent interpretation performed. Decision-making details documented in ED Course. ECG/medicine tests: ordered and independent interpretation performed. Decision-making details documented in ED Course.  Risk Prescription drug management.   Upper abdominal pain with nausea, vomiting and diarrhea for the past 1 week.  Stable vital signs.  No distress.  Abdomen soft without peritoneal signs.  Lab work done in the waiting room is unremarkable.  LFTs and lipase are normal.  Urinalysis is negative.  Patient given IV fluids as well as antiemetics.  CT scan will be obtained per her PCPs order  No vomiting or diarrhea throughout ED course.  She is unable to give a stool sample.  States she gave a stool sample earlier in the week at her PCPs office which was negative for C. difficile and other pathologies.  CT shows mild sigmoid colitis as well as ovarian  cyst.  No evidence of bowel obstruction.  Low suspicion for ovarian torsion.  Most of her abdominal pain is upper. -Patient given first dose of IV antibiotics in the ED.  She is tolerating p.o. without difficulty.  Abdomen is soft without peritoneal signs  Patient states her C. difficile testing was negative earlier this week.  We will treat her colitis with p.o. antibiotics.  Given her allergies will prescribe cephalosporin as well as Flagyl.  States she can tolerate cephalosporins but not penicillins.  Discussed p.o. antibiotics, antiemetics and pain control.  Follow-up with PCP as well as gastroenterology.  Return precautions discussed.        Final Clinical Impression(s) / ED Diagnoses Final diagnoses:  Colitis    Rx / DC Orders ED Discharge Orders     None         Miyonna Ormiston, Annie Main, MD 05/05/22 2324

## 2022-05-08 ENCOUNTER — Encounter: Payer: Self-pay | Admitting: Physician Assistant

## 2022-05-09 ENCOUNTER — Encounter: Payer: Self-pay | Admitting: Oncology

## 2022-05-18 ENCOUNTER — Inpatient Hospital Stay: Payer: Commercial Managed Care - HMO | Attending: Oncology

## 2022-05-18 ENCOUNTER — Other Ambulatory Visit: Payer: Self-pay

## 2022-05-18 ENCOUNTER — Inpatient Hospital Stay (HOSPITAL_BASED_OUTPATIENT_CLINIC_OR_DEPARTMENT_OTHER): Payer: Commercial Managed Care - HMO | Admitting: Oncology

## 2022-05-18 VITALS — BP 119/75 | HR 82 | Temp 97.9°F | Resp 19 | Ht 69.0 in | Wt 262.2 lb

## 2022-05-18 DIAGNOSIS — R197 Diarrhea, unspecified: Secondary | ICD-10-CM | POA: Diagnosis not present

## 2022-05-18 DIAGNOSIS — D6851 Activated protein C resistance: Secondary | ICD-10-CM | POA: Insufficient documentation

## 2022-05-18 DIAGNOSIS — R109 Unspecified abdominal pain: Secondary | ICD-10-CM | POA: Diagnosis not present

## 2022-05-18 DIAGNOSIS — N920 Excessive and frequent menstruation with regular cycle: Secondary | ICD-10-CM | POA: Diagnosis present

## 2022-05-18 DIAGNOSIS — R110 Nausea: Secondary | ICD-10-CM | POA: Diagnosis not present

## 2022-05-18 DIAGNOSIS — D500 Iron deficiency anemia secondary to blood loss (chronic): Secondary | ICD-10-CM | POA: Diagnosis present

## 2022-05-18 LAB — CBC WITH DIFFERENTIAL (CANCER CENTER ONLY)
Abs Immature Granulocytes: 0.02 10*3/uL (ref 0.00–0.07)
Basophils Absolute: 0.1 10*3/uL (ref 0.0–0.1)
Basophils Relative: 1 %
Eosinophils Absolute: 0.1 10*3/uL (ref 0.0–0.5)
Eosinophils Relative: 1 %
HCT: 42.7 % (ref 36.0–46.0)
Hemoglobin: 14.4 g/dL (ref 12.0–15.0)
Immature Granulocytes: 0 %
Lymphocytes Relative: 15 %
Lymphs Abs: 1.5 10*3/uL (ref 0.7–4.0)
MCH: 29.8 pg (ref 26.0–34.0)
MCHC: 33.7 g/dL (ref 30.0–36.0)
MCV: 88.2 fL (ref 80.0–100.0)
Monocytes Absolute: 0.7 10*3/uL (ref 0.1–1.0)
Monocytes Relative: 6 %
Neutro Abs: 7.8 10*3/uL — ABNORMAL HIGH (ref 1.7–7.7)
Neutrophils Relative %: 77 %
Platelet Count: 287 10*3/uL (ref 150–400)
RBC: 4.84 MIL/uL (ref 3.87–5.11)
RDW: 13 % (ref 11.5–15.5)
WBC Count: 10.2 10*3/uL (ref 4.0–10.5)
nRBC: 0 % (ref 0.0–0.2)

## 2022-05-18 LAB — IRON AND IRON BINDING CAPACITY (CC-WL,HP ONLY)
Iron: 142 ug/dL (ref 28–170)
Saturation Ratios: 53 % — ABNORMAL HIGH (ref 10.4–31.8)
TIBC: 270 ug/dL (ref 250–450)
UIBC: 128 ug/dL — ABNORMAL LOW (ref 148–442)

## 2022-05-18 NOTE — Progress Notes (Signed)
Hematology and Oncology Follow Up Visit  Robin Key [id number] 08-16-1985 36 y.o. 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     Secondary diagnosis: Iron deficiency anemia diagnosed in 2012.  This is related to related to menstrual blood losses.      Prior Therapy:   Full dose anticoagulation with Lovenox after she had a C-section on 07/01/2013.   She is status post IV iron infusion as needed.  Last treatment was in November 2021.  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 She was switched to Lovenox in June 2018.  Therapy discontinued in March 2019.  Xarelto 20 mg daily started in June 2019.  She has not been taking it leading up to December 2022.  Current therapy:   Xarelto 20 mg daily restarted in December 2020 and to be continued indefinitely.   Interim History:  Robin Key presents today for repeat evaluation.  Since last visit, she reports GI complaints in the last month.  She had reported abdominal pain, diarrhea and nausea.  She had a CT scan obtained on May 05, 2022 which showed mild colitis.  She was treated with antibiotics without any significant improvement.  She is scheduled to have a GI evaluation in the near future.     Medications: Updated on review. Current Outpatient Medications  Medication Sig Dispense Refill   acyclovir (ZOVIRAX) 400 MG tablet      butalbital-acetaminophen-caffeine (FIORICET, ESGIC) 50-325-40 MG per tablet Take 2 tablets by mouth every 6 (six) hours as needed for headache. 30 tablet 1   cefdinir (OMNICEF) 300 MG capsule Take 1 capsule (300 mg total) by mouth 2 (two) times daily. 14 capsule 0   cephALEXin (KEFLEX) 500 MG capsule Take 1 capsule (500 mg total) by mouth 2 (two) times daily. 20 capsule 0   cetirizine  (ZYRTEC) 10 MG tablet Take 10 mg by mouth daily.     diclofenac (VOLTAREN) 75 MG EC tablet TAKE 1 TABLET(75 MG) BY MOUTH TWICE DAILY 30 tablet 0   fluconazole (DIFLUCAN) 150 MG tablet Take 1 tablet (150 mg total) by mouth daily. 1 tablet 0   fluticasone (FLONASE) 50 MCG/ACT nasal spray Place 2 sprays into both nostrils daily.     HYDROcodone-acetaminophen (NORCO) 7.5-325 MG tablet Take 1 tablet by mouth every 6 (six) hours as needed for moderate pain. 21 tablet 0   meloxicam (MOBIC) 15 MG tablet Take 1 tablet (15 mg total) by mouth daily. 30 tablet 0   metroNIDAZOLE (FLAGYL) 500 MG tablet Take 1 tablet (500 mg total) by mouth 2 (two) times daily. 14 tablet 0   ondansetron (ZOFRAN-ODT) 4 MG disintegrating tablet Take 1 tablet (4 mg total) by mouth every 8 (eight) hours as needed for nausea or vomiting. 20 tablet 0   pantoprazole (PROTONIX) 40 MG tablet Take by mouth.     SUMAtriptan (IMITREX) 50 MG tablet      SYRINGE-NEEDLE, DISP, 3 ML 25G X 5/8" 3 ML MISC 1,000 mcg by Misc.(Non-Drug; Combo Route) route every 30 days.     triamcinolone (KENALOG) 0.1 % paste Apply to mouth as needed.     Vitamin D, Ergocalciferol, (DRISDOL) 1.25 MG (50000 UT) CAPS capsule      XARELTO 20 MG TABS tablet TAKE 1 TABLET(20 MG) BY MOUTH DAILY 90 tablet 0   Current Facility-Administered Medications  Medication Dose  Route Frequency Provider Last Rate Last Admin   triamcinolone acetonide (KENALOG) 10 MG/ML injection 10 mg  10 mg Other Once Landis Martins, DPM       triamcinolone acetonide (KENALOG-40) injection 20 mg  20 mg Other Once Landis Martins, DPM       triamcinolone acetonide (KENALOG-40) injection 20 mg  20 mg Other Once Landis Martins, DPM       triamcinolone acetonide (KENALOG-40) injection 20 mg  20 mg Other Once Landis Martins, DPM       triamcinolone acetonide (KENALOG-40) injection 20 mg  20 mg Other Once Landis Martins, DPM         Allergies:  Allergies  Allergen Reactions   Ciprofloxacin  Other (See Comments)    Felt like "bugs crawling" when on Lovenox   Neomycin-Bacitracin Zn-Polymyx Rash   Shellfish Allergy Rash and Swelling    Shrimp    Shellfish-Derived Products Swelling    Shrimp Shrimp   Bactrim [Sulfamethoxazole-Trimethoprim] Other (See Comments)    headache Headache    Oxycodone Nausea And Vomiting   Percocet [Oxycodone-Acetaminophen] Nausea And Vomiting   Amoxicillin Rash   Latex Rash   Miconazole Nitrate Rash   Oxycodone-Acetaminophen Nausea And Vomiting   Penicillins Rash   Sulfa Antibiotics Rash   Sulfasalazine Rash      Physical Exam:         Blood pressure 119/75, pulse 82, temperature 97.9 F (36.6 C), temperature source Temporal, resp. rate 19, height '5\' 9"'$  (1.753 m), weight 262 lb 3.2 oz (118.9 kg), SpO2 100 %, unknown if currently breastfeeding.      ECOG: 0    General appearance: Comfortable appearing without any discomfort Head: Normocephalic without any trauma Oropharynx: Mucous membranes are moist and pink without any thrush or ulcers. Eyes: Pupils are equal and round reactive to light. Lymph nodes: No cervical, supraclavicular, inguinal or axillary lymphadenopathy.   Heart:regular rate and rhythm.  S1 and S2 without leg edema. Lung: Clear without any rhonchi or wheezes.  No dullness to percussion. Abdomin: Soft, nontender, nondistended with good bowel sounds.  No hepatosplenomegaly. Musculoskeletal: No joint deformity or effusion.  Full range of motion noted. Neurological: No deficits noted on motor, sensory and deep tendon reflex exam. Skin: No petechial rash or dryness.  Appeared moist.                  Lab Results: Lab Results  Component Value Date   WBC 8.8 05/05/2022   HGB 14.6 05/05/2022   HCT 44.0 05/05/2022   MCV 89.6 05/05/2022   PLT 261 05/05/2022     Chemistry      Component Value Date/Time   NA 138 05/05/2022 1445   NA 139 08/27/2015 1411   K 3.8 05/05/2022 1445   K 3.9  08/27/2015 1411   CL 105 05/05/2022 1445   CO2 25 05/05/2022 1445   CO2 28 08/27/2015 1411   BUN 9 05/05/2022 1445   BUN 11.5 08/27/2015 1411   CREATININE 0.90 05/05/2022 1445   CREATININE 0.9 08/27/2015 1411      Component Value Date/Time   CALCIUM 9.3 05/05/2022 1445   CALCIUM 9.7 08/27/2015 1411   ALKPHOS 91 05/05/2022 1445   ALKPHOS 100 08/27/2015 1411   AST 14 (L) 05/05/2022 1445   AST 17 08/27/2015 1411   ALT 19 05/05/2022 1445   ALT 18 08/27/2015 1411   BILITOT 1.0 05/05/2022 1445   BILITOT 0.79 08/27/2015 1411     Iron/TIBC/Ferritin/ %Sat  Component Value Date/Time   IRON 57 02/15/2022 1517   IRON 72 03/14/2017 1510   TIBC 294 02/15/2022 1517   TIBC 263 03/14/2017 1510   FERRITIN 102 02/15/2022 1517   FERRITIN 321 (H) 03/14/2017 1510   IRONPCTSAT 19 02/15/2022 1517   IRONPCTSAT 28 03/14/2017 1510    Impression and Plan:  36 year old woman with:   1.  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     Risks and benefits of continuing long-term anticoagulation with Xarelto were discussed.  She has had recurrent thrombosis off of it and has agreed to continue indefinitely.  She developed recurrent bleeding in the future therapy and reduction could be considered.  2.  Iron deficiency anemia: This is related to chronic menstrual blood losses and long-term anticoagulation.  Her hemoglobin today is 14.4 with iron studies in July were adequate.  Repeat intravenous iron infusion will be considered if needed in the future.  3.  Heavy menstrual cycles: Improved with IUD at this time.  No excessive bleeding noted.  4.  GI complaints: He has symptoms of colitis could have inflammatory bowel disease.  She has GI follow-up in the near future.  5.  Follow-up: In 3 months for repeat evaluation.  30  minutes were spent on this visit.  The time was dedicated to reviewing  laboratory data, disease status update and outlining future plan of care review.  Zola Button, MD 05/18/22

## 2022-05-19 LAB — FERRITIN: Ferritin: 127 ng/mL (ref 11–307)

## 2022-06-05 ENCOUNTER — Other Ambulatory Visit: Payer: Self-pay | Admitting: Internal Medicine

## 2022-06-07 ENCOUNTER — Other Ambulatory Visit: Payer: Self-pay | Admitting: Oncology

## 2022-06-07 ENCOUNTER — Telehealth: Payer: Self-pay | Admitting: *Deleted

## 2022-06-07 DIAGNOSIS — D6851 Activated protein C resistance: Secondary | ICD-10-CM

## 2022-06-07 NOTE — Telephone Encounter (Signed)
Return fax to [facility] with anticoagulation recommendation for patient's upcoming colonoscopy/endoscopy - Dr Alen Blew advises that patient stop her Xarelto on 06/25/22 & restart on 06/28/22, procedure is 06/27/22.  Fax confirmation received.

## 2022-06-08 ENCOUNTER — Ambulatory Visit (INDEPENDENT_AMBULATORY_CARE_PROVIDER_SITE_OTHER): Payer: Commercial Managed Care - HMO | Admitting: Physician Assistant

## 2022-06-08 ENCOUNTER — Encounter: Payer: Self-pay | Admitting: Physician Assistant

## 2022-06-08 VITALS — BP 118/68 | HR 69 | Ht 69.0 in | Wt 258.5 lb

## 2022-06-08 DIAGNOSIS — R634 Abnormal weight loss: Secondary | ICD-10-CM

## 2022-06-08 DIAGNOSIS — K6289 Other specified diseases of anus and rectum: Secondary | ICD-10-CM | POA: Diagnosis not present

## 2022-06-08 DIAGNOSIS — R197 Diarrhea, unspecified: Secondary | ICD-10-CM

## 2022-06-08 DIAGNOSIS — R112 Nausea with vomiting, unspecified: Secondary | ICD-10-CM | POA: Diagnosis not present

## 2022-06-08 DIAGNOSIS — K648 Other hemorrhoids: Secondary | ICD-10-CM | POA: Diagnosis not present

## 2022-06-08 MED ORDER — HYDROCORTISONE ACETATE 25 MG RE SUPP: 25.0000 mg | Freq: Two times a day (BID) | RECTAL | 0 refills | Status: AC

## 2022-06-08 MED ORDER — HYDROCORTISONE (PERIANAL) 2.5 % EX CREA: 1.0000 | TOPICAL_CREAM | Freq: Two times a day (BID) | CUTANEOUS | 0 refills | Status: AC

## 2022-06-08 NOTE — Patient Instructions (Signed)
We have sent the following medications to your pharmacy for you to pick up at your convenience: Anusol suppositories and Anusol cream twice daily for 7-14 days.   The [facility] providers would like to encourage you to use [facility] to communicate with providers for non-urgent requests or questions.  Due to long hold times on the telephone, sending your provider a message by [facility] may be a faster and more efficient way to get a response.  Please allow 48 business hours for a response.  Please remember that this is for non-urgent requests.

## 2022-06-08 NOTE — Progress Notes (Signed)
Chief Complaint: Nausea and vomiting, hemorrhoids and diarrhea  HPI:    Robin Key is a 36 year old female with a past medical history as listed below including factor V Leiden and pulmonary embolism on Xarelto and lupus, who was referred to me by Bess Harvest* for a complaint of nausea, vomiting, hemorrhoids and diarrhea.    01/28/2019 patient followed with digestive health in regards to GERD and diarrhea.  That time was because she had a normal work-up revealing no evidence of fat-soluble vitamin deficiencies, negative stool testing and normal blood work.  She continued to have difficulty with multiple episodes of diarrhea day.  She cut back on Caffeine and Lactulose but it did not help.  Reflux was the same with postprandial coughing, pyrosis and thick postprandial mucus despite once daily Pantoprazole 40 daily.  At time discussed diarrhea was likely functional versus cola reticulocyte.  She was tried on Colestipol 1 mg titrated to effect for control of diarrhea.  Discussed that if no relief with this medication would consider antispasmodics.  Recommended an EGD.    05/05/2022 patient seen in the ED for abdominal pain.  At that time discussed upper abdominal pain for a week with nausea vomiting and diarrhea.  Labs including CMP, C. difficile, GI path panel, CBC, lipase, urinalysis were all normal.  CT abdomen pelvis with contrast showed mild colitis involving the sigmoid colon, ovarian cyst and evidence of prior cholecystectomy as well as small fat-containing umbilical hernia.  Patient given Cephalosporin as well as Flagyl.    05/09/2022 patient followed PCP.  At that time discussed how her abdominal pain had improved anytime she was put on Prednisone by her orthopedist.  Chronic iron deficiency anemia and factor V Leiden deficiency.  She was referred to GI.    Today, the patient tells me that she was seen by [facility] recently actually because she was able to get in with them sooner, but her  insurance will not pay for office visits with them.  She tells me that she went to see them and they put her on Cholestyramine which she is using once daily which is actually helped to decrease the amount of diarrheal stools she has typically though occasionally she will have breakthrough days where she still has loose watery stools.  Also describes urgency and abdominal pain with almost constant nausea and vomiting quite often regardless of her Pantoprazole 40 mg daily and Famotidine 40 twice daily as well as Zofran 8 mg every 8 hours.  Patient also discusses that due to her frequent stools she now has hemorrhoids which are itchy and has been using over-the-counter Preparation H that does not really help.  Associated symptoms include weight loss of about 30 pounds since her symptoms started about 4 months ago.    She is already scheduled for procedures at the hospital given her factor V Leiden and Xarelto with [facility].  Tells me that she always has a blood clot when she has to hold her Xarelto for a day.    Denies fever, chills or symptoms that awaken her from sleep.  Past Medical History:  Diagnosis Date   Blood dyscrasia    factor 5   DVT (deep vein thrombosis) in pregnancy    Factor V Leiden ([place])    Headache(784.0)    Infection    UTI   PCOS (polycystic ovarian syndrome)    PONV (postoperative nausea and vomiting)    Pulmonary embolism ([place]) 06/08/2011   Varicose veins  Past Surgical History:  Procedure Laterality Date   bladder stem stretched     CESAREAN SECTION N/A 07/01/2013   Procedure: CESAREAN SECTION;  Surgeon: Allena Katz, MD;  Location: [facility];  Service: Obstetrics;  Laterality: N/A;  Primary edc 07/11/13    Current Outpatient Medications  Medication Sig Dispense Refill   acyclovir (ZOVIRAX) 400 MG tablet      butalbital-acetaminophen-caffeine (FIORICET, ESGIC) 50-325-40 MG per tablet Take 2 tablets by mouth every 6 (six) hours as needed for headache. 30 tablet  1   cefdinir (OMNICEF) 300 MG capsule Take 1 capsule (300 mg total) by mouth 2 (two) times daily. 14 capsule 0   cephALEXin (KEFLEX) 500 MG capsule Take 1 capsule (500 mg total) by mouth 2 (two) times daily. 20 capsule 0   cetirizine (ZYRTEC) 10 MG tablet Take 10 mg by mouth daily.     diclofenac (VOLTAREN) 75 MG EC tablet TAKE 1 TABLET(75 MG) BY MOUTH TWICE DAILY 30 tablet 0   fluconazole (DIFLUCAN) 150 MG tablet Take 1 tablet (150 mg total) by mouth daily. 1 tablet 0   fluticasone (FLONASE) 50 MCG/ACT nasal spray Place 2 sprays into both nostrils daily.     HYDROcodone-acetaminophen (NORCO) 7.5-325 MG tablet Take 1 tablet by mouth every 6 (six) hours as needed for moderate pain. 21 tablet 0   meloxicam (MOBIC) 15 MG tablet Take 1 tablet (15 mg total) by mouth daily. 30 tablet 0   metroNIDAZOLE (FLAGYL) 500 MG tablet Take 1 tablet (500 mg total) by mouth 2 (two) times daily. 14 tablet 0   ondansetron (ZOFRAN-ODT) 4 MG disintegrating tablet Take 1 tablet (4 mg total) by mouth every 8 (eight) hours as needed for nausea or vomiting. 20 tablet 0   pantoprazole (PROTONIX) 40 MG tablet Take by mouth.     SUMAtriptan (IMITREX) 50 MG tablet      SYRINGE-NEEDLE, DISP, 3 ML 25G X 5/8" 3 ML MISC 1,000 mcg by Misc.(Non-Drug; Combo Route) route every 30 days.     triamcinolone (KENALOG) 0.1 % paste Apply to mouth as needed.     Vitamin D, Ergocalciferol, (DRISDOL) 1.25 MG (50000 UT) CAPS capsule      XARELTO 20 MG TABS tablet TAKE 1 TABLET BY MOUTH EVERY DAY 90 tablet 0   Current Facility-Administered Medications  Medication Dose Route Frequency Provider Last Rate Last Admin   triamcinolone acetonide (KENALOG) 10 MG/ML injection 10 mg  10 mg Other Once Landis Martins, DPM       triamcinolone acetonide (KENALOG-40) injection 20 mg  20 mg Other Once Landis Martins, DPM       triamcinolone acetonide (KENALOG-40) injection 20 mg  20 mg Other Once Landis Martins, DPM       triamcinolone acetonide  (KENALOG-40) injection 20 mg  20 mg Other Once Landis Martins, DPM       triamcinolone acetonide (KENALOG-40) injection 20 mg  20 mg Other Once Landis Martins, DPM        Allergies as of 06/08/2022 - Review Complete 05/05/2022  Allergen Reaction Noted   Ciprofloxacin Other (See Comments) 11/27/2012   Neomycin-bacitracin zn-polymyx Rash 08/04/2017   Shellfish allergy Rash and Swelling 08/17/2013   Shellfish-derived products Swelling 08/17/2013   Bactrim [sulfamethoxazole-trimethoprim] Other (See Comments) 11/27/2012   Oxycodone Nausea And Vomiting 10/21/2015   Percocet [oxycodone-acetaminophen] Nausea And Vomiting 06/24/2013   Amoxicillin Rash 10/21/2015   Latex Rash 11/27/2012   Miconazole nitrate Rash 09/24/2019   Oxycodone-acetaminophen Nausea And Vomiting 10/15/2015  Penicillins Rash 11/27/2012   Sulfa antibiotics Rash 08/17/2013   Sulfasalazine Rash 08/17/2013    Family History  Problem Relation Age of Onset   Heart disease Maternal Grandfather    Hypertension Maternal Grandfather    Stroke Maternal Grandfather    Dementia Paternal Grandmother    Heart disease Paternal Grandfather    Cancer Mother        skin   Hyperlipidemia Mother    Heart disease Father    Miscarriages / Stillbirths Sister    Diabetes Maternal Grandmother    Cancer Maternal Grandmother        breast   Osteoporosis Maternal Grandmother    Glaucoma Maternal Grandmother    Vision loss Maternal Grandmother    Breast cancer Maternal Grandmother     Social History   Socioeconomic History   Marital status: Married    Spouse name: Not on file   Number of children: Not on file   Years of education: Not on file   Highest education level: Not on file  Occupational History   Not on file  Tobacco Use   Smoking status: Never   Smokeless tobacco: Never  Substance and Sexual Activity   Alcohol use: No   Drug use: No   Sexual activity: Not Currently    Comment: not sex in more than a month  Other  Topics Concern   Not on file  Social History Narrative   Not on file   Social Determinants of Health   Financial Resource Strain: Not on file  Food Insecurity: Not on file  Transportation Needs: Not on file  Physical Activity: Not on file  Stress: Not on file  Social Connections: Not on file  Intimate Partner Violence: Not on file    Review of Systems:    Constitutional: No weight loss, fever or chills Skin: No rash Cardiovascular: No chest pain   Respiratory: No SOB  Gastrointestinal: See HPI and otherwise negative Genitourinary: No dysuria Neurological: No headache, dizziness or syncope Musculoskeletal: No new muscle or joint pain Hematologic: No bleeding  Psychiatric: No history of depression or anxiety   Physical Exam:  Vital signs: BP 118/68 (BP Location: Left Arm, Patient Position: Sitting, Cuff Size: Large)   Pulse 69   Ht '5\' 9"'$  (1.753 m)   Wt 258 lb 8 oz (117.3 kg)   SpO2 98%   BMI 38.17 kg/m   Constitutional:   Pleasant overweight Caucasian female appears to be in NAD, Well developed, Well nourished, alert and cooperative Head:  Normocephalic and atraumatic. Eyes:   PEERL, EOMI. No icterus. Conjunctiva pink. Ears:  Normal auditory acuity. Neck:  Supple Throat: Oral cavity and pharynx without inflammation, swelling or lesion.  Respiratory: Respirations even and unlabored. Lungs clear to auscultation bilaterally.   No wheezes, crackles, or rhonchi.  Cardiovascular: Normal S1, S2. No MRG. Regular rate and rhythm. No peripheral edema, cyanosis or pallor.  Gastrointestinal:  Soft, nondistended, nontender. No rebound or guarding. Normal bowel sounds. No appreciable masses or hepatomegaly. Rectal:  Declined Msk:  Symmetrical without gross deformities. Without edema, no deformity or joint abnormality.  Neurologic:  Alert and  oriented x4;  grossly normal neurologically.  Skin:   Dry and intact without significant lesions or rashes. Psychiatric: Demonstrates good  judgement and reason without abnormal affect or behaviors.  RELEVANT LABS AND IMAGING: CBC    Component Value Date/Time   WBC 10.2 05/18/2022 1425   WBC 8.8 05/05/2022 1445   RBC 4.84 05/18/2022 1425  HGB 14.4 05/18/2022 1425   HGB 13.9 03/14/2017 1510   HCT 42.7 05/18/2022 1425   HCT 41.0 03/14/2017 1510   PLT 287 05/18/2022 1425   PLT 250 03/14/2017 1510   MCV 88.2 05/18/2022 1425   MCV 89.1 03/14/2017 1510   MCH 29.8 05/18/2022 1425   MCHC 33.7 05/18/2022 1425   RDW 13.0 05/18/2022 1425   RDW 12.5 03/14/2017 1510   LYMPHSABS 1.5 05/18/2022 1425   LYMPHSABS 1.6 03/14/2017 1510   MONOABS 0.7 05/18/2022 1425   MONOABS 0.5 03/14/2017 1510   EOSABS 0.1 05/18/2022 1425   EOSABS 0.1 03/14/2017 1510   BASOSABS 0.1 05/18/2022 1425   BASOSABS 0.0 03/14/2017 1510    CMP     Component Value Date/Time   NA 138 05/05/2022 1445   NA 139 08/27/2015 1411   K 3.8 05/05/2022 1445   K 3.9 08/27/2015 1411   CL 105 05/05/2022 1445   CO2 25 05/05/2022 1445   CO2 28 08/27/2015 1411   GLUCOSE 96 05/05/2022 1445   GLUCOSE 84 08/27/2015 1411   BUN 9 05/05/2022 1445   BUN 11.5 08/27/2015 1411   CREATININE 0.90 05/05/2022 1445   CREATININE 0.9 08/27/2015 1411   CALCIUM 9.3 05/05/2022 1445   CALCIUM 9.7 08/27/2015 1411   PROT 7.3 05/05/2022 1445   PROT 7.5 08/27/2015 1411   ALBUMIN 4.3 05/05/2022 1445   ALBUMIN 4.0 08/27/2015 1411   AST 14 (L) 05/05/2022 1445   AST 17 08/27/2015 1411   ALT 19 05/05/2022 1445   ALT 18 08/27/2015 1411   ALKPHOS 91 05/05/2022 1445   ALKPHOS 100 08/27/2015 1411   BILITOT 1.0 05/05/2022 1445   BILITOT 0.79 08/27/2015 1411   GFRNONAA >60 05/05/2022 1445   GFRAA >90 07/01/2013 1115    Assessment: 1.  Nausea and vomiting: Over the past 3 to 4 months, trouble when eating with chronic nausea, minimally helped by Zofran; consider gastritis versus gastroparesis versus other 2.  Change in bowel habits: To diarrhea up to 15 times a day very urgent with  abdominal pain, decreased some with the addition of Cholestyramine once daily, but still has breakthrough symptoms, CT showed colitis, history of lupus; consider IBD versus other 3.  Abdominal pain: With above 4.  Hemorrhoids: Declines rectal exam but describes it as itchy and also "on the inside" 5.  Weight loss: Describes a 30 pound weight loss since symptoms began; likely related to all the above  Plan: 1.  Patient tells me that she recently actually saw [facility] gastroenterology and is scheduled at the hospital for an EGD and colonoscopy for diagnostic purposes given all of her symptoms.  Tells me that she was not sure if she needed to come to this appointment or not. 2.  Continue Cholestyramine as recently prescribed. 3.  Prescribed Hydrocortisone suppositories twice daily x7 to 14 days as well as Hydrocortisone ointment 2.5% to be applied to the outside of the rectum twice daily for the next 7 to 14 days for hemorrhoids. 4.  Explained to patient that we would not be able to get her in any sooner than she is scheduled with [facility] for her procedures.  I would recommend that she follow-up with them for the procedures. 5.  Answered all the patient's questions.  If patient returns to clinic here she is assigned to Dr. Bryan Lemma today.  Ellouise Newer, PA-C [facility] 06/08/2022, 10:32 AM  Cc: [name], [name]*

## 2022-06-09 NOTE — Progress Notes (Signed)
Agree with the assessment and plan as outlined by Ellouise Newer, PA-C.  Patient has been seen by [facility] in the past, and very recently established care with [facility], including being appropriately scheduled for EGD/colonoscopy at [facility].  Since she has already been evaluated in their clinic and is scheduled for procedures with them, I recommend that she continue following with [facility] only so to limit confounding recommendations or input of overlapping prescriptions.  Our office can remain available if [facility] requires assistance.  Rogerick Baldwin, DO, [facility]

## 2022-06-13 IMAGING — CT CT ABD-PELV W/ CM
2 of 4 series · 16 of 46 positions shown, 18 images · IV contrast (APPLIED)
Comparison: CT examination dated March 11, 2019

CLINICAL DATA: Right lower quadrant abdominal pain. Started 3 days
ago.

EXAM:
CT ABDOMEN AND PELVIS WITH CONTRAST
TECHNIQUE: Multidetector CT imaging of the abdomen and pelvis was performed
using the standard protocol following bolus administration of
intravenous contrast.

[Series 3: abdomen 5.0 · axial · 0.98mm/px · z∈[+492,+922]mm · 13 of 96 slices shown, 15 images]
[im 5/96  soft-tissue]
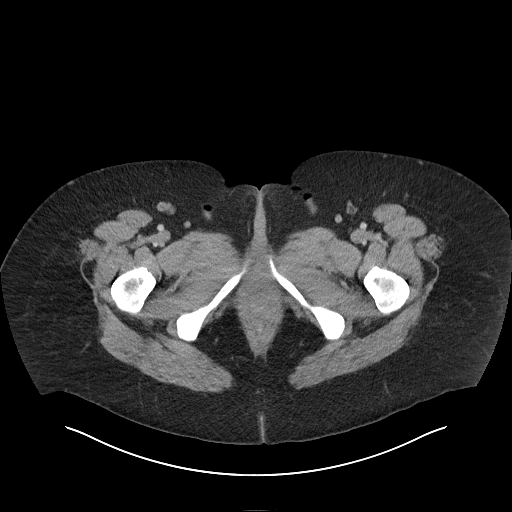
[im 5/96  bone]
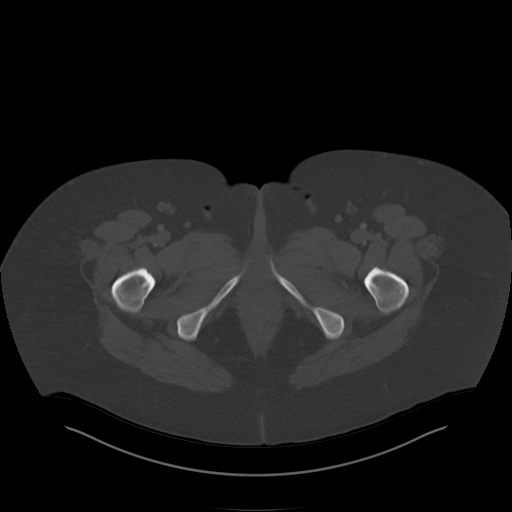
[im 14/96  soft-tissue]
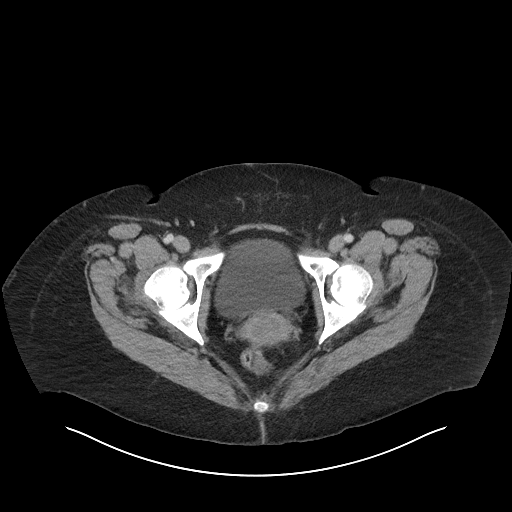
[im 19/96  soft-tissue]
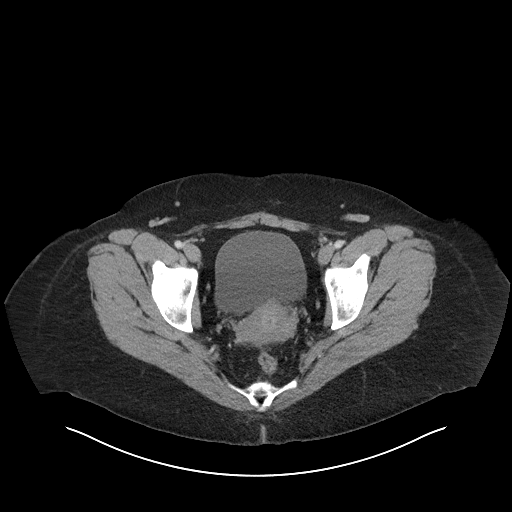
[im 28/96  soft-tissue]
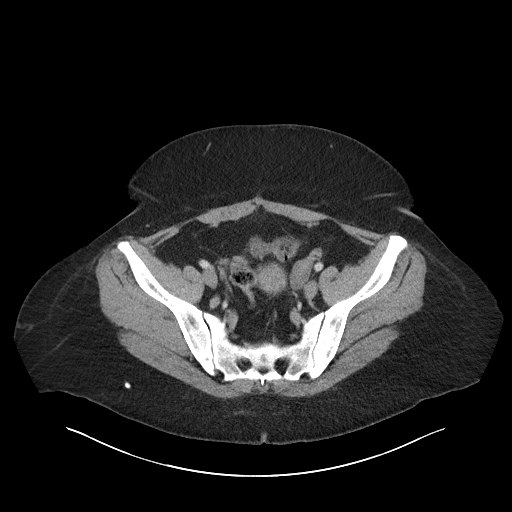
[im 32/96  soft-tissue]
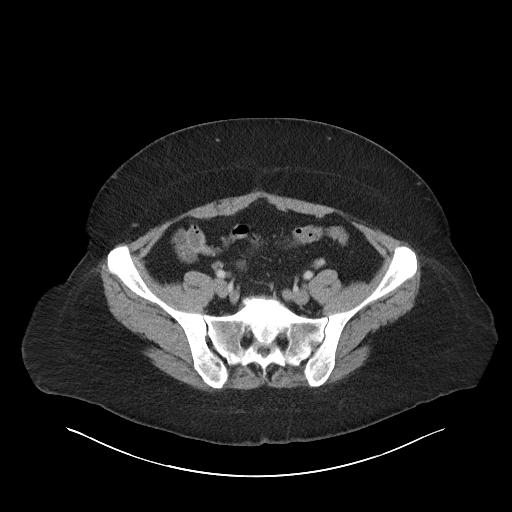
[im 41/96  soft-tissue]
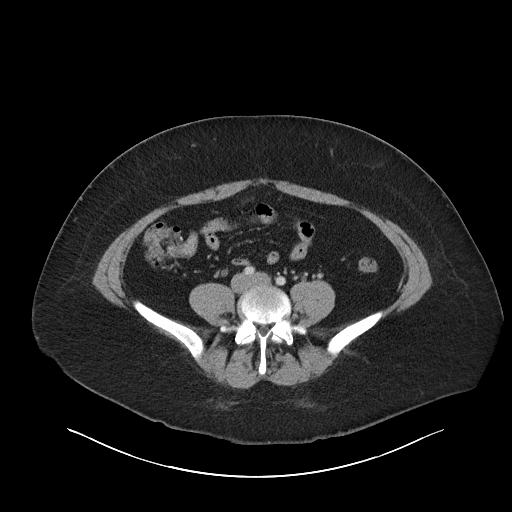
[im 50/96  soft-tissue]
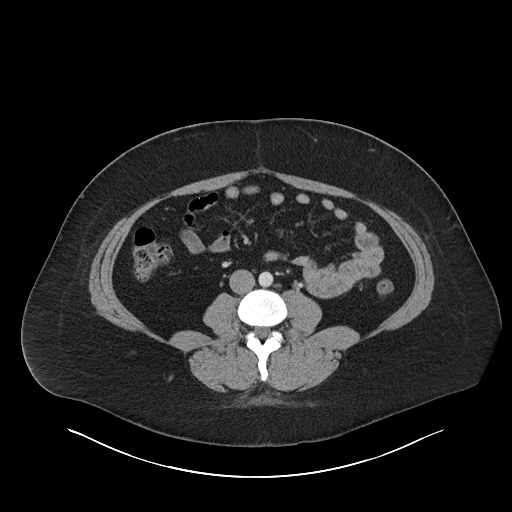
[im 55/96  soft-tissue]
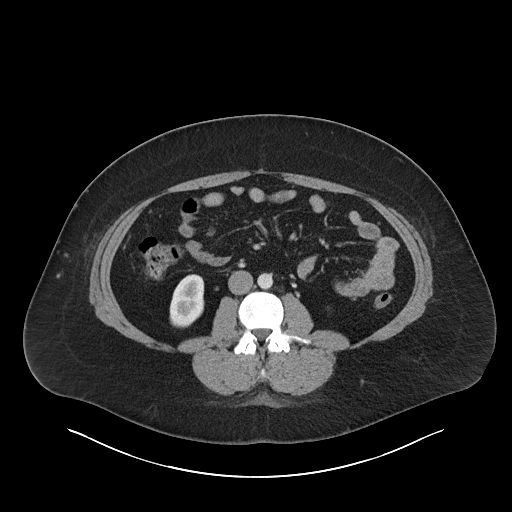
[im 64/96  soft-tissue]
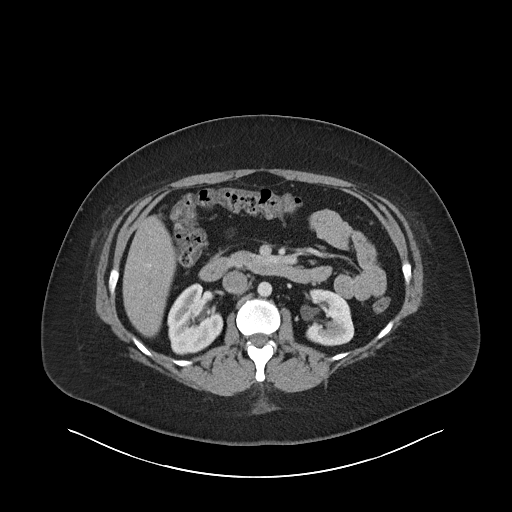
[im 64/96  bone]
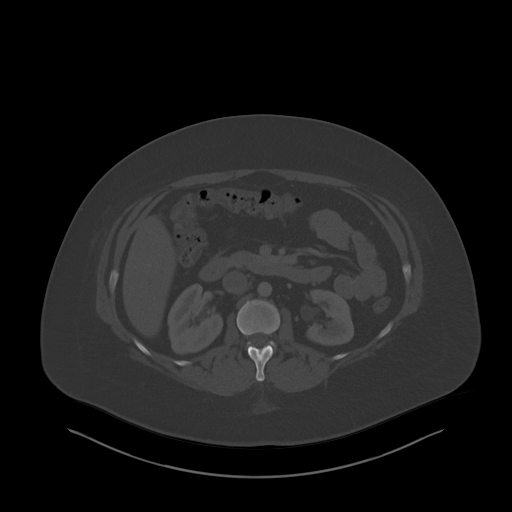
[im 68/96  soft-tissue]
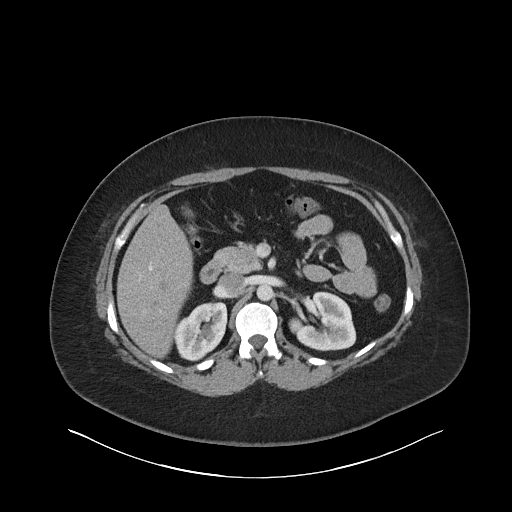
[im 77/96  soft-tissue]
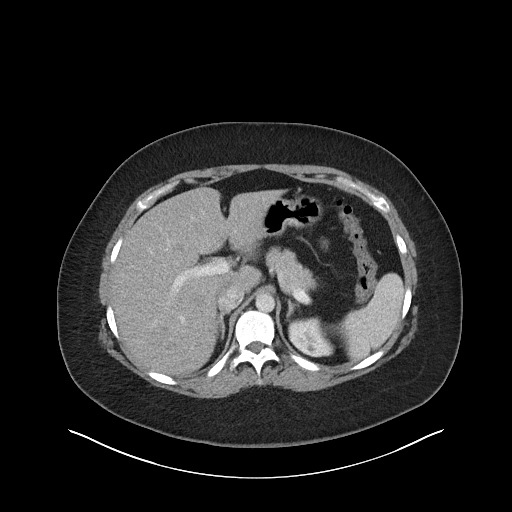
[im 82/96  soft-tissue]
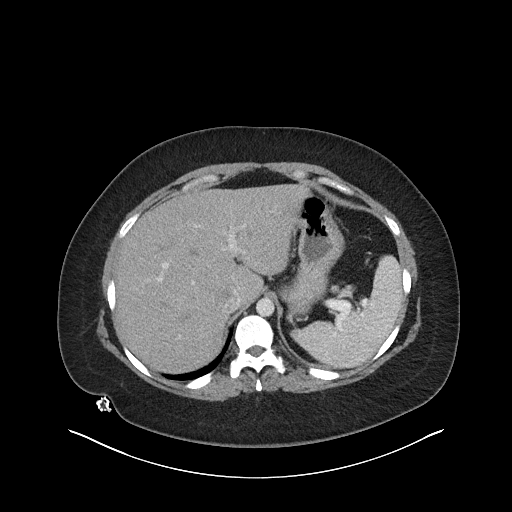
[im 91/96  soft-tissue]
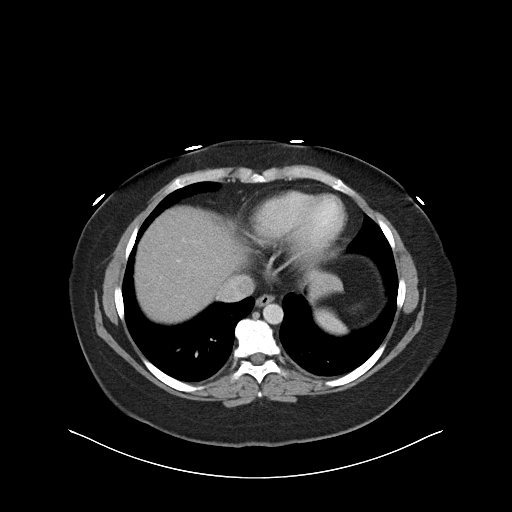

[Series 6: abdomen 3.0 mpr cor · coronal · 0.93mm/px · 3 of 110 slices shown]
[im 37/110  soft-tissue]
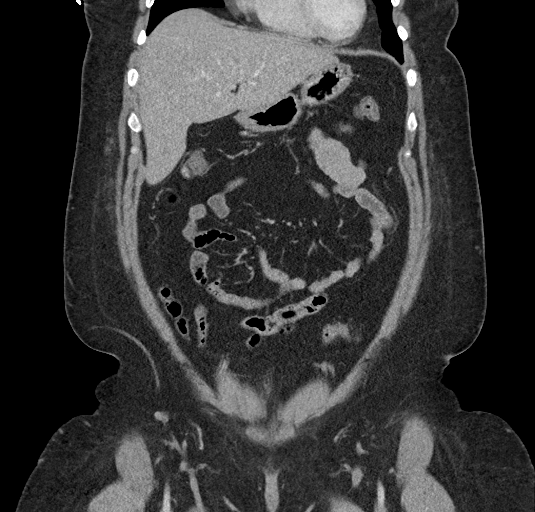
[im 49/110  soft-tissue]
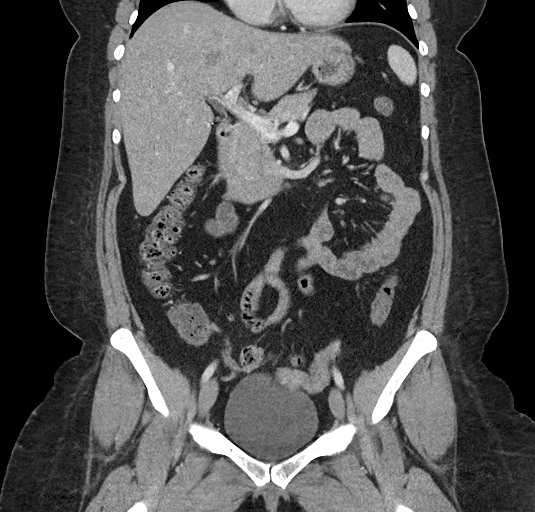
[im 61/110  soft-tissue]
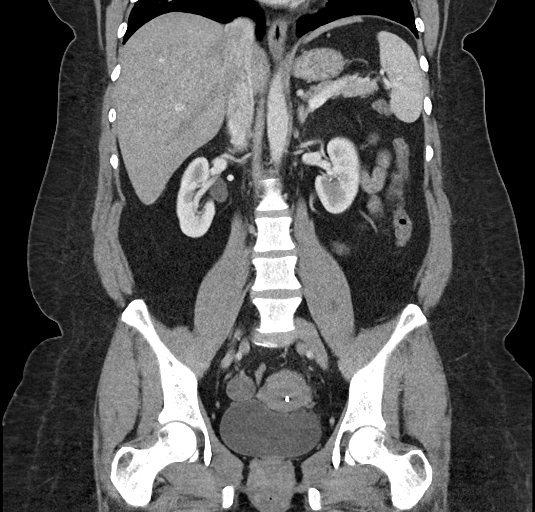

[16 of 46 positions shown; findings below may reference images not displayed]

RADIATION DOSE REDUCTION: This exam was performed according to the
departmental dose-optimization program which includes automated
exposure control, adjustment of the mA and/or kV according to
patient size and/or use of iterative reconstruction technique.

CONTRAST:  100mL OMNIPAQUE IOHEXOL 300 MG/ML  SOLN
FINDINGS: Lower chest: No acute abnormality.

Hepatobiliary: No focal liver abnormality is seen. Status post
cholecystectomy. No biliary dilatation.

Pancreas: Unremarkable. No pancreatic ductal dilatation or
surrounding inflammatory changes.

Spleen: Normal in size without focal abnormality.

Adrenals/Urinary Tract: Adrenal glands are unremarkable. Kidneys are
normal, without renal calculi, focal lesion, or hydronephrosis.
Bladder is unremarkable.

Stomach/Bowel: Stomach is within normal limits. Appendix appears
normal. No evidence of bowel wall thickening, distention, or
inflammatory changes.

Vascular/Lymphatic: No significant vascular findings are present. No
enlarged abdominal or pelvic lymph nodes.

Reproductive: Uterus and bilateral adnexa are unremarkable.
Intrauterine contraceptive device in satisfactory position.

Other: No abdominal wall hernia or abnormality. No abdominopelvic
ascites.

Musculoskeletal: Mild degenerate disc disease of the lumbar spine
prominent at L5-S1.
IMPRESSION: 1.  No CT evidence of acute abdominal/pelvic process.

2.  Status post cholecystectomy.  No biliary ductal dilatation.

3.  Normal appendix.  No evidence of colitis or diverticulitis.

4.  No evidence of nephrolithiasis or hydronephrosis.

5.  Degenerate disease of the lumbar spine at L5-S1.

## 2022-06-19 ENCOUNTER — Encounter (HOSPITAL_COMMUNITY): Payer: Self-pay | Admitting: Internal Medicine

## 2022-06-19 ENCOUNTER — Encounter: Payer: Self-pay | Admitting: Oncology

## 2022-06-21 ENCOUNTER — Telehealth: Payer: Self-pay | Admitting: *Deleted

## 2022-06-21 ENCOUNTER — Other Ambulatory Visit: Payer: Self-pay

## 2022-06-21 ENCOUNTER — Inpatient Hospital Stay: Payer: Commercial Managed Care - HMO | Attending: Oncology

## 2022-06-21 DIAGNOSIS — D6851 Activated protein C resistance: Secondary | ICD-10-CM | POA: Diagnosis present

## 2022-06-21 DIAGNOSIS — D500 Iron deficiency anemia secondary to blood loss (chronic): Secondary | ICD-10-CM | POA: Diagnosis present

## 2022-06-21 DIAGNOSIS — N920 Excessive and frequent menstruation with regular cycle: Secondary | ICD-10-CM | POA: Diagnosis present

## 2022-06-21 LAB — CBC WITH DIFFERENTIAL (CANCER CENTER ONLY)
Abs Immature Granulocytes: 0.01 10*3/uL (ref 0.00–0.07)
Basophils Absolute: 0 10*3/uL (ref 0.0–0.1)
Basophils Relative: 1 %
Eosinophils Absolute: 0.1 10*3/uL (ref 0.0–0.5)
Eosinophils Relative: 1 %
HCT: 41.5 % (ref 36.0–46.0)
Hemoglobin: 13.9 g/dL (ref 12.0–15.0)
Immature Granulocytes: 0 %
Lymphocytes Relative: 15 %
Lymphs Abs: 1 10*3/uL (ref 0.7–4.0)
MCH: 29.8 pg (ref 26.0–34.0)
MCHC: 33.5 g/dL (ref 30.0–36.0)
MCV: 89.1 fL (ref 80.0–100.0)
Monocytes Absolute: 0.5 10*3/uL (ref 0.1–1.0)
Monocytes Relative: 8 %
Neutro Abs: 5 10*3/uL (ref 1.7–7.7)
Neutrophils Relative %: 75 %
Platelet Count: 245 10*3/uL (ref 150–400)
RBC: 4.66 MIL/uL (ref 3.87–5.11)
RDW: 13 % (ref 11.5–15.5)
WBC Count: 6.6 10*3/uL (ref 4.0–10.5)
nRBC: 0 % (ref 0.0–0.2)

## 2022-06-21 LAB — IRON AND IRON BINDING CAPACITY (CC-WL,HP ONLY)
Iron: 120 ug/dL (ref 28–170)
Saturation Ratios: 39 % — ABNORMAL HIGH (ref 10.4–31.8)
TIBC: 308 ug/dL (ref 250–450)
UIBC: 188 ug/dL (ref 148–442)

## 2022-06-21 LAB — FERRITIN: Ferritin: 108 ng/mL (ref 11–307)

## 2022-06-21 NOTE — Telephone Encounter (Signed)
PC to patient, informed her that her labwork today is normal, she verbalizes understanding.

## 2022-06-21 NOTE — Telephone Encounter (Signed)
-----   Message from Wyatt Portela, MD sent at 06/21/2022  2:24 PM EST ----- Please let her know her iron is normal

## 2022-06-27 ENCOUNTER — Encounter (HOSPITAL_COMMUNITY)
Admission: RE | Disposition: A | Discharge status: Home / Self Care | Payer: Self-pay | Source: Ambulatory Visit | Attending: Internal Medicine

## 2022-06-27 ENCOUNTER — Encounter (HOSPITAL_COMMUNITY): Payer: Self-pay | Admitting: Internal Medicine

## 2022-06-27 ENCOUNTER — Ambulatory Visit (HOSPITAL_COMMUNITY): Payer: Commercial Managed Care - HMO | Admitting: Certified Registered Nurse Anesthetist

## 2022-06-27 ENCOUNTER — Ambulatory Visit (HOSPITAL_BASED_OUTPATIENT_CLINIC_OR_DEPARTMENT_OTHER): Payer: Commercial Managed Care - HMO | Admitting: Certified Registered Nurse Anesthetist

## 2022-06-27 ENCOUNTER — Other Ambulatory Visit: Payer: Self-pay

## 2022-06-27 ENCOUNTER — Ambulatory Visit (HOSPITAL_COMMUNITY)
Admission: RE | Admit: 2022-06-27 | Discharge: 2022-06-27 | Disposition: A | Discharge status: Home / Self Care | Payer: Commercial Managed Care - HMO | Source: Ambulatory Visit | Attending: Internal Medicine | Admitting: Internal Medicine

## 2022-06-27 DIAGNOSIS — M069 Rheumatoid arthritis, unspecified: Secondary | ICD-10-CM | POA: Diagnosis not present

## 2022-06-27 DIAGNOSIS — K3189 Other diseases of stomach and duodenum: Secondary | ICD-10-CM

## 2022-06-27 DIAGNOSIS — M329 Systemic lupus erythematosus, unspecified: Secondary | ICD-10-CM | POA: Diagnosis not present

## 2022-06-27 DIAGNOSIS — K319 Disease of stomach and duodenum, unspecified: Secondary | ICD-10-CM | POA: Diagnosis not present

## 2022-06-27 DIAGNOSIS — K644 Residual hemorrhoidal skin tags: Secondary | ICD-10-CM | POA: Insufficient documentation

## 2022-06-27 DIAGNOSIS — K2950 Unspecified chronic gastritis without bleeding: Secondary | ICD-10-CM | POA: Diagnosis not present

## 2022-06-27 DIAGNOSIS — Z7901 Long term (current) use of anticoagulants: Secondary | ICD-10-CM | POA: Insufficient documentation

## 2022-06-27 DIAGNOSIS — D123 Benign neoplasm of transverse colon: Secondary | ICD-10-CM | POA: Insufficient documentation

## 2022-06-27 DIAGNOSIS — R197 Diarrhea, unspecified: Secondary | ICD-10-CM | POA: Diagnosis present

## 2022-06-27 DIAGNOSIS — R933 Abnormal findings on diagnostic imaging of other parts of digestive tract: Secondary | ICD-10-CM | POA: Insufficient documentation

## 2022-06-27 DIAGNOSIS — G43909 Migraine, unspecified, not intractable, without status migrainosus: Secondary | ICD-10-CM | POA: Diagnosis not present

## 2022-06-27 DIAGNOSIS — D649 Anemia, unspecified: Secondary | ICD-10-CM | POA: Insufficient documentation

## 2022-06-27 DIAGNOSIS — Z800 Family history of malignant neoplasm of digestive organs: Secondary | ICD-10-CM | POA: Diagnosis not present

## 2022-06-27 DIAGNOSIS — R112 Nausea with vomiting, unspecified: Secondary | ICD-10-CM | POA: Insufficient documentation

## 2022-06-27 DIAGNOSIS — D759 Disease of blood and blood-forming organs, unspecified: Secondary | ICD-10-CM | POA: Insufficient documentation

## 2022-06-27 DIAGNOSIS — Z9049 Acquired absence of other specified parts of digestive tract: Secondary | ICD-10-CM | POA: Diagnosis not present

## 2022-06-27 DIAGNOSIS — Z79899 Other long term (current) drug therapy: Secondary | ICD-10-CM | POA: Insufficient documentation

## 2022-06-27 DIAGNOSIS — K648 Other hemorrhoids: Secondary | ICD-10-CM | POA: Insufficient documentation

## 2022-06-27 DIAGNOSIS — K635 Polyp of colon: Secondary | ICD-10-CM

## 2022-06-27 DIAGNOSIS — D6851 Activated protein C resistance: Secondary | ICD-10-CM | POA: Insufficient documentation

## 2022-06-27 HISTORY — PX: ESOPHAGOGASTRODUODENOSCOPY (EGD) WITH PROPOFOL: SHX5813

## 2022-06-27 HISTORY — PX: BIOPSY: SHX5522

## 2022-06-27 HISTORY — PX: COLONOSCOPY WITH PROPOFOL: SHX5780

## 2022-06-27 LAB — PREGNANCY, URINE: Preg Test, Ur: NEGATIVE

## 2022-06-27 SURGERY — ESOPHAGOGASTRODUODENOSCOPY (EGD) WITH PROPOFOL
Anesthesia: Monitor Anesthesia Care

## 2022-06-27 MED ORDER — LACTATED RINGERS IV SOLN
INTRAVENOUS | Status: DC
  Administered 2022-06-27: 1000 mL via INTRAVENOUS

## 2022-06-27 MED ORDER — PROPOFOL 10 MG/ML IV BOLUS
INTRAVENOUS | Status: AC
  Filled 2022-06-27: qty 20

## 2022-06-27 MED ORDER — PROPOFOL 1000 MG/100ML IV EMUL
INTRAVENOUS | Status: AC
  Filled 2022-06-27: qty 100

## 2022-06-27 MED ORDER — PROPOFOL 500 MG/50ML IV EMUL
INTRAVENOUS | Status: DC | PRN
  Administered 2022-06-27: 125 ug/kg/min via INTRAVENOUS

## 2022-06-27 MED ORDER — LIDOCAINE 2% (20 MG/ML) 5 ML SYRINGE
INTRAMUSCULAR | Status: DC | PRN
  Administered 2022-06-27: 100 mg via INTRAVENOUS

## 2022-06-27 MED ORDER — PROPOFOL 10 MG/ML IV BOLUS
INTRAVENOUS | Status: DC | PRN
  Administered 2022-06-27: 60 mg via INTRAVENOUS
  Administered 2022-06-27 (×2): 50 mg via INTRAVENOUS
  Administered 2022-06-27: 40 mg via INTRAVENOUS

## 2022-06-27 SURGICAL SUPPLY — 25 items

## 2022-06-27 NOTE — Transfer of Care (Signed)
Immediate Anesthesia Transfer of Care Note  Patient: Robin Key  Procedure(s) Performed: ESOPHAGOGASTRODUODENOSCOPY (EGD) WITH PROPOFOL COLONOSCOPY WITH PROPOFOL BIOPSY POLYPECTOMY  Patient Location: Endoscopy Unit  Anesthesia Type:MAC  Level of Consciousness: drowsy and patient cooperative  Airway & Oxygen Therapy: Patient Spontanous Breathing and Patient connected to face mask oxygen  Post-op Assessment: Report given to RN and Post -op Vital signs reviewed and stable  Post vital signs: Reviewed and stable  Last Vitals:  Vitals Value Taken Time  BP 151/88 06/27/22 1442  Temp    Pulse 69 06/27/22 1442  Resp 20 06/27/22 1442  SpO2 100 % 06/27/22 1442  Vitals shown include unvalidated device data.  Last Pain:  Vitals:   06/27/22 1104  TempSrc: Temporal  PainSc: 0-No pain         Complications: No notable events documented.

## 2022-06-27 NOTE — Op Note (Signed)
[facility] Patient Name: Robin Key Procedure Date: 06/27/2022 MRN: [id number] Attending MD: Clarene Essex , MD, [id number] Date of Birth: Apr 08, 1986 CSN: [id number] Age: 36 Admit Type: Inpatient Procedure:                Upper GI endoscopy Indications:              Diarrhea, Nausea with vomiting Providers:                Kary Kos RN, RN, Cherylynn Ridges, Technician,                            Cathe Mons, CRNA, Clarene Essex, MD Referring MD:              Medicines:                Monitored Anesthesia Care Complications:            No immediate complications. Estimated Blood Loss:     Estimated blood loss: none. Procedure:                Pre-Anesthesia Assessment:                           - Prior to the procedure, a History and Physical                            was performed, and patient medications and                            allergies were reviewed. The patient's tolerance of                            previous anesthesia was also reviewed. The risks                            and benefits of the procedure and the sedation                            options and risks were discussed with the patient.                            All questions were answered, and informed consent                            was obtained. Prior Anticoagulants: The patient has                            taken Xarelto (rivaroxaban), last dose was 2 days                            prior to procedure. ASA Grade Assessment: III - A                            patient with severe systemic disease. After  reviewing the risks and benefits, the patient was                            deemed in satisfactory condition to undergo the                            procedure.                           After obtaining informed consent, the endoscope was                            passed under direct vision. Throughout the                            procedure, the patient's  blood pressure, pulse, and                            oxygen saturations were monitored continuously. The                            [id number]-[id number] ([id number]) Olympus endoscope was introduced                            through the mouth, and advanced to the fourth part                            of duodenum. The upper GI endoscopy was                            accomplished without difficulty. The patient                            tolerated the procedure well. Scope In: Scope Out: Findings:      The larynx was normal.      The examined esophagus was normal.      The entire examined stomach was normal. Biopsies were taken with a cold       forceps for histology.      The duodenal bulb, first portion of the duodenum, second portion of the       duodenum, third portion of the duodenum and fourth portion of the       duodenum were normal. Biopsies were taken with a cold forceps for       histology.      The exam was otherwise without abnormality. Impression:               - Normal larynx.                           - Normal esophagus.                           - Normal stomach. Biopsied.                           - Normal duodenal bulb, first portion of the  duodenum, second portion of the duodenum, third                            portion of the duodenum and fourth portion of the                            duodenum. Biopsied.                           - The examination was otherwise normal. Moderate Sedation:      Not Applicable - Patient had care per Anesthesia. Recommendation:           - Patient has a contact number available for                            emergencies. The signs and symptoms of potential                            delayed complications were discussed with the                            patient. Return to normal activities tomorrow.                            Written discharge instructions were provided to the                            patient.                            - Soft diet today.                           - Continue present medications.                           - Resume Xarelto (rivaroxaban) at prior dose                            tomorrow.                           - Return to GI clinic PRN.                           - Telephone GI clinic for pathology results in 1                            week.                           - Telephone GI clinic if symptomatic PRN.                           - Perform a colonoscopy today. Procedure Code(s):        --- Professional ---  60737, Esophagogastroduodenoscopy, flexible,                            transoral; with biopsy, single or multiple Diagnosis Code(s):        --- Professional ---                           R19.7, Diarrhea, unspecified                           R11.2, Nausea with vomiting, unspecified CPT copyright 2022 [employer]. All rights reserved. The codes documented in this report are preliminary and upon coder review may  be revised to meet current compliance requirements. Clarene Essex, MD 06/27/2022 2:41:55 PM This report has been signed electronically. Number of Addenda: 0

## 2022-06-27 NOTE — Discharge Instructions (Addendum)
YOU HAD AN ENDOSCOPIC PROCEDURE TODAY: Refer to the procedure report and other information in the discharge instructions given to you for any specific questions about what was found during the examination. If this information does not answer your questions, please call [facility] office at [phone] to clarify.   YOU SHOULD EXPECT: Some feelings of bloating in the abdomen. Passage of more gas than usual. Walking can help get rid of the air that was put into your GI tract during the procedure and reduce the bloating. If you had a lower endoscopy (such as a colonoscopy or flexible sigmoidoscopy) you may notice spotting of blood in your stool or on the toilet paper. Some abdominal soreness may be present for a day or two, also.  DIET: Your first meal following the procedure should be a light meal and then it is ok to progress to your normal diet. A half-sandwich or bowl of soup is an example of a good first meal. Heavy or fried foods are harder to digest and may make you feel nauseous or bloated. Drink plenty of fluids but you should avoid alcoholic beverages for 24 hours. If you had a esophageal dilation, please see attached instructions for diet.    ACTIVITY: Your care partner should take you home directly after the procedure. You should plan to take it easy, moving slowly for the rest of the day. You can resume normal activity the day after the procedure however YOU SHOULD NOT DRIVE, use power tools, machinery or perform tasks that involve climbing or major physical exertion for 24 hours (because of the sedation medicines used during the test).   SYMPTOMS TO REPORT IMMEDIATELY: A gastroenterologist can be reached at any hour. Please call [phone]  for any of the following symptoms:  Following lower endoscopy (colonoscopy, flexible sigmoidoscopy) Excessive amounts of blood in the stool  Significant tenderness, worsening of abdominal pains  Swelling of the abdomen that is new, acute  Fever of 100  or higher  Following upper endoscopy (EGD, EUS, ERCP, esophageal dilation) Vomiting of blood or coffee ground material  New, significant abdominal pain  New, significant chest pain or pain under the shoulder blades  Painful or persistently difficult swallowing  New shortness of breath  Black, tarry-looking or red, bloody stools  FOLLOW UP:  If any biopsies were taken you will be contacted by phone or by letter within the next 1-3 weeks. Call [phone]  if you have not heard about the biopsies in 3 weeks.  Please also call with any specific questions about appointments or follow up tests. YOU HAD AN ENDOSCOPIC PROCEDURE TODAY: Refer to the procedure report and other information in the discharge instructions given to you for any specific questions about what was found during the examination. If this information does not answer your questions, please call [facility] office at [phone] to clarify.   YOU SHOULD EXPECT: Some feelings of bloating in the abdomen. Passage of more gas than usual. Walking can help get rid of the air that was put into your GI tract during the procedure and reduce the bloating. If you had a lower endoscopy (such as a colonoscopy or flexible sigmoidoscopy) you may notice spotting of blood in your stool or on the toilet paper. Some abdominal soreness may be present for a day or two, also.  DIET: Your first meal following the procedure should be a light meal and then it is ok to progress to your normal diet. A half-sandwich or bowl of soup is an   example of a good first meal. Heavy or fried foods are harder to digest and may make you feel nauseous or bloated. Drink plenty of fluids but you should avoid alcoholic beverages for 24 hours. If you had a esophageal dilation, please see attached instructions for diet.    ACTIVITY: Your care partner should take you home directly after the procedure. You should plan to take it easy, moving slowly for the rest of the day. You can resume  normal activity the day after the procedure however YOU SHOULD NOT DRIVE, use power tools, machinery or perform tasks that involve climbing or major physical exertion for 24 hours (because of the sedation medicines used during the test).   SYMPTOMS TO REPORT IMMEDIATELY: A gastroenterologist can be reached at any hour. Please call [phone]  for any of the following symptoms:  Following lower endoscopy (colonoscopy, flexible sigmoidoscopy) Excessive amounts of blood in the stool  Significant tenderness, worsening of abdominal pains  Swelling of the abdomen that is new, acute  Fever of 100 or higher  Following upper endoscopy (EGD, EUS, ERCP, esophageal dilation) Vomiting of blood or coffee ground material  New, significant abdominal pain  New, significant chest pain or pain under the shoulder blades  Painful or persistently difficult swallowing  New shortness of breath  Black, tarry-looking or red, bloody stools  FOLLOW UP:  If any biopsies were taken you will be contacted by phone or by letter within the next 1-3 weeks. Call [phone]  if you have not heard about the biopsies in 3 weeks.  Please also call with any specific questions about appointments or follow up tests.    [facility] ENDOSCOPY [place], [place]  [address] Phone:  [phone]   Patient: Robin Key  Date of Birth: June 11, 1986  Date of Visit: 06/05/2022   To Whom It May Concern:  Robin Key was seen and treated on 06/27/2022 and  can return to work 06/28/2022 .           Sincerely,     Treatment Team:  Attending Provider: Clarene Essex MD

## 2022-06-27 NOTE — Progress Notes (Signed)
Robin Key 1:46 PM  Subjective: Patient seen and examined in her office computer note was reviewed in her hospital computer chart reviewed and she has both chronic diarrhea and nausea and vomiting and the Questran does help the diarrhea and her Xarelto was held on Saturday she has no other complaints  Objective: Vital signs stable afebrile exam please see preassessment evaluation CT reviewed  Assessment: Multiple GI complaints  Plan: Okay to proceed with endoscopy and colonoscopy with anesthesia assistance  [facility]  office [phone] After 5PM or if no answer call [phone]

## 2022-06-27 NOTE — Anesthesia Preprocedure Evaluation (Signed)
Anesthesia Evaluation  Patient identified by MRN, date of birth, ID band Patient awake    Reviewed: Allergy & Precautions, NPO status , Patient's Chart, lab work & pertinent test results  History of Anesthesia Complications (+) PONV and history of anesthetic complications  Airway Mallampati: I  TM Distance: >3 FB Neck ROM: Full    Dental no notable dental hx. (+) Dental Advisory Given   Pulmonary neg pulmonary ROS   Pulmonary exam normal breath sounds clear to auscultation       Cardiovascular negative cardio ROS Normal cardiovascular exam Rhythm:Regular Rate:Normal     Neuro/Psych  Headaches    GI/Hepatic negative GI ROS, Neg liver ROS,,,  Endo/Other  negative endocrine ROS    Renal/GU negative Renal ROS     Musculoskeletal negative musculoskeletal ROS (+)    Abdominal   Peds  Hematology  (+) Blood dyscrasia, anemia   Anesthesia Other Findings   Reproductive/Obstetrics                             Anesthesia Physical Anesthesia Plan  ASA: 2  Anesthesia Plan: MAC   Post-op Pain Management: Minimal or no pain anticipated   Induction: Intravenous  PONV Risk Score and Plan: 3 and TIVA, Propofol infusion and Treatment may vary due to age or medical condition  Airway Management Planned: Natural Airway  Additional Equipment:   Intra-op Plan:   Post-operative Plan:   Informed Consent: I have reviewed the patients History and Physical, chart, labs and discussed the procedure including the risks, benefits and alternatives for the proposed anesthesia with the patient or authorized representative who has indicated his/her understanding and acceptance.     Dental advisory given  Plan Discussed with: CRNA  Anesthesia Plan Comments:         Anesthesia Quick Evaluation

## 2022-06-27 NOTE — Op Note (Signed)
[facility] Patient Name: Robin Key Procedure Date: 06/27/2022 MRN: [id number] Attending MD: Clarene Essex , MD, [id number] Date of Birth: 09-Jul-1986 CSN: [id number] Age: 36 Admit Type: Outpatient Procedure:                Colonoscopy Indications:              Clinically significant diarrhea of unexplained                            origin, Abnormal CT of the GI tract Providers:                Kary Kos RN, RN, Cherylynn Ridges, Technician,                            Cathe Mons, CRNA, Clarene Essex, MD Referring MD:              Medicines:                Monitored Anesthesia Care Complications:            No immediate complications. Estimated Blood Loss:     Estimated blood loss: none. Estimated blood loss:                            none. Procedure:                Pre-Anesthesia Assessment:                           - Prior to the procedure, a History and Physical                            was performed, and patient medications and                            allergies were reviewed. The patient's tolerance of                            previous anesthesia was also reviewed. The risks                            and benefits of the procedure and the sedation                            options and risks were discussed with the patient.                            All questions were answered, and informed consent                            was obtained. Prior Anticoagulants: The patient has                            taken Xarelto (rivaroxaban), last dose was 2 days  prior to procedure. ASA Grade Assessment: III - A                            patient with severe systemic disease. After                            reviewing the risks and benefits, the patient was                            deemed in satisfactory condition to undergo the                            procedure.                           - Prior to the procedure, a History and  Physical                            was performed, and patient medications and                            allergies were reviewed. The patient's tolerance of                            previous anesthesia was also reviewed. The risks                            and benefits of the procedure and the sedation                            options and risks were discussed with the patient.                            All questions were answered, and informed consent                            was obtained. Prior Anticoagulants: The patient has                            taken Xarelto (rivaroxaban), last dose was 2 days                            prior to procedure. ASA Grade Assessment: III - A                            patient with severe systemic disease. After                            reviewing the risks and benefits, the patient was                            deemed in satisfactory condition to undergo the  procedure.                           After obtaining informed consent, the colonoscope                            was passed under direct vision. Throughout the                            procedure, the patient's blood pressure, pulse, and                            oxygen saturations were monitored continuously. The                            CF-HQ190L ([id number]) Olympus colonoscope was                            introduced through the anus and advanced to the the                            terminal ileum. The terminal ileum, ileocecal                            valve, appendiceal orifice, and rectum were                            photographed. The colonoscopy was performed without                            difficulty. The patient tolerated the procedure                            well. The quality of the bowel preparation was                            adequate. Scope In: 2:18:49 PM Scope Out: 2:33:28 PM Scope Withdrawal Time: 0 hours 12 minutes 25 seconds   Total Procedure Duration: 0 hours 14 minutes 39 seconds  Findings:      External and internal hemorrhoids were found during retroflexion, during       perianal exam and during digital exam. The hemorrhoids were small.      The colon (entire examined portion) appeared normal. Biopsies for       histology were taken with a cold forceps from the entire colon for       evaluation of microscopic colitis.      The terminal ileum appeared normal. Biopsies were taken with a cold       forceps for histology.      The exam was otherwise without abnormality on direct and retroflexion       views.      A small polyp was found in the hepatic flexure. The polyp was       semi-sessile. Biopsies were taken with a cold forceps for histology. Impression:               - External and internal hemorrhoids.                           -  The entire examined colon is normal. Biopsied.                           - The examined portion of the ileum was normal.                            Biopsied.                           - The examination was otherwise normal on direct                            and retroflexion views. Moderate Sedation:      Not Applicable - Patient had care per Anesthesia. Recommendation:           - Patient has a contact number available for                            emergencies. The signs and symptoms of potential                            delayed complications were discussed with the                            patient. Return to normal activities tomorrow.                            Written discharge instructions were provided to the                            patient.                           - Soft diet today.                           - Continue present medications.                           - Resume Xarelto (rivaroxaban) at prior dose                            tomorrow.                           - Await pathology results.                           - Repeat colonoscopy in 5-10  years for surveillance                            based on pathology results.                           - Repeat colonoscopy date to be determined after  pending pathology results are reviewed for                            surveillance based on pathology results.                           - Return to GI office PRN.                           - Telephone GI clinic for pathology results in 1                            week.                           - Telephone GI clinic if symptomatic PRN. Procedure Code(s):        --- Professional ---                           [phone], Colonoscopy, flexible; with biopsy, single                            or multiple Diagnosis Code(s):        --- Professional ---                           K64.8, Other hemorrhoids                           R19.7, Diarrhea, unspecified                           R93.3, Abnormal findings on diagnostic imaging of                            other parts of digestive tract CPT copyright 2022 [employer]. All rights reserved. The codes documented in this report are preliminary and upon coder review may  be revised to meet current compliance requirements. Clarene Essex, MD 06/27/2022 2:47:41 PM This report has been signed electronically. Number of Addenda: 0

## 2022-06-28 LAB — SURGICAL PATHOLOGY

## 2022-06-28 NOTE — Anesthesia Postprocedure Evaluation (Signed)
Anesthesia Post Note  Patient: Robin Key  Procedure(s) Performed: ESOPHAGOGASTRODUODENOSCOPY (EGD) WITH PROPOFOL COLONOSCOPY WITH PROPOFOL BIOPSY POLYPECTOMY     Anesthesia Type: MAC Anesthetic complications: no   No notable events documented.  Last Vitals:  Vitals:   06/27/22 1445 06/27/22 1510  BP:  (!) 141/79  Pulse:  62  Resp:  16  Temp:    SpO2: 100% 100%    Last Pain:  Vitals:   06/27/22 1444  TempSrc: Oral  PainSc: 0-No pain                 Nolon Nations

## 2022-06-30 ENCOUNTER — Encounter (HOSPITAL_COMMUNITY): Payer: Self-pay | Admitting: Gastroenterology

## 2022-07-07 ENCOUNTER — Ambulatory Visit
Admission: RE | Admit: 2022-07-07 | Discharge: 2022-07-07 | Disposition: A | Discharge status: Home / Self Care | Payer: Commercial Managed Care - HMO | Source: Ambulatory Visit | Attending: Emergency Medicine | Admitting: Emergency Medicine

## 2022-07-07 VITALS — BP 116/83 | HR 105 | Temp 98.1°F | Resp 18 | Ht 69.0 in | Wt 249.8 lb

## 2022-07-07 DIAGNOSIS — J0100 Acute maxillary sinusitis, unspecified: Secondary | ICD-10-CM | POA: Diagnosis not present

## 2022-07-07 DIAGNOSIS — M5442 Lumbago with sciatica, left side: Secondary | ICD-10-CM | POA: Diagnosis not present

## 2022-07-07 MED ORDER — PREDNISONE 10 MG (21) PO TBPK: ORAL_TABLET | Freq: Every day | ORAL | 0 refills | Status: DC

## 2022-07-07 MED ORDER — AZITHROMYCIN 250 MG PO TABS: 250.0000 mg | ORAL_TABLET | Freq: Every day | ORAL | 0 refills | Status: DC

## 2022-07-07 NOTE — Discharge Instructions (Addendum)
Take the Zithromax and prednisone as directed.  Follow up with your primary care provider if your symptoms are not improving.

## 2022-07-07 NOTE — ED Provider Notes (Signed)
Robin Key    CSN: [id number] Arrival date & time: 07/07/22  1527      History   Chief Complaint Chief Complaint  Patient presents with   Headache    Sinus pain and bleeding nose. Covid negative. Lower back pain left side from lifting - Entered by patient    HPI Robin Key is a 36 y.o. female.  Patient presents with 6 day history of sinus pressure, congestion, nose bleeds.  She denies cough, shortness of breath, fever, chills, sore throat.  Patient also presents with left lower back pain which radiates to her left leg x 1 week.  The back pain started after she was pulling a deer carcase from the woods.  She reports history of similar symptoms when she had sciatica which was treated with prednisone.  She denies numbness, weakness, saddle anesthesia, loss of bowel/bladder control, abdominal pain, dysuria, hematuria, or other symptoms.  LMP last week.  She denies current pregnancy or breastfeeding.     The history is provided by the patient and medical records.    Past Medical History:  Diagnosis Date   Blood dyscrasia    factor 5   DVT (deep vein thrombosis) in pregnancy    Factor V Leiden ([place])    Headache(784.0)    Infection    UTI   PCOS (polycystic ovarian syndrome)    PONV (postoperative nausea and vomiting)    Pulmonary embolism ([place]) 06/08/2011   Varicose veins     Patient Active Problem List   Diagnosis Date Noted   Cholecystitis, chronic 10/18/2015   Cholecystitis 10/15/2015   Leg pain 10/15/2014   Factor V Leiden, prothrombin gene mutation ([place]) 10/15/2014   Varicose veins of lower extremities with other complications 65/46/5035   Pain and swelling of lower extremity 10/08/2013   Anemia 09/08/2013   Personal history of venous thrombosis and embolism 09/08/2013   Cesarean delivery delivered 07/01/2013    Past Surgical History:  Procedure Laterality Date   BIOPSY  06/27/2022   Procedure: BIOPSY;  Surgeon: Clarene Essex, MD;  Location: [facility];  Service: Gastroenterology;;   bladder stem stretched     CESAREAN SECTION N/A 07/01/2013   Procedure: CESAREAN SECTION;  Surgeon: Allena Katz, MD;  Location: [facility];  Service: Obstetrics;  Laterality: N/A;  Primary edc 07/11/13   COLONOSCOPY WITH PROPOFOL N/A 06/27/2022   Procedure: COLONOSCOPY WITH PROPOFOL;  Surgeon: Clarene Essex, MD;  Location: [facility];  Service: Gastroenterology;  Laterality: N/A;   ESOPHAGOGASTRODUODENOSCOPY (EGD) WITH PROPOFOL N/A 06/27/2022   Procedure: ESOPHAGOGASTRODUODENOSCOPY (EGD) WITH PROPOFOL;  Surgeon: Clarene Essex, MD;  Location: [facility];  Service: Gastroenterology;  Laterality: N/A;    OB History     Gravida  1   Para  1   Term  1   Preterm  0   AB  0   Living  1      SAB  0   IAB  0   Ectopic  0   Multiple  0   Live Births  1            Home Medications    Prior to Admission medications   Medication Sig Start Date End Date Taking? Authorizing Provider  azithromycin (ZITHROMAX) 250 MG tablet Take 1 tablet (250 mg total) by mouth daily. Take first 2 tablets together, then 1 every day until finished. 07/07/22  Yes Sharion Balloon, NP  predniSONE (STERAPRED UNI-PAK 21 TAB) 10 MG (21) TBPK tablet  Take by mouth daily. As directed 07/07/22  Yes Sharion Balloon, NP  acetaminophen (TYLENOL) 500 MG tablet Take 1,000 mg by mouth every 6 (six) hours as needed for moderate pain.    [provider]  butalbital-acetaminophen-caffeine (FIORICET, ESGIC) 50-325-40 MG per tablet Take 2 tablets by mouth every 6 (six) hours as needed for headache. 11/06/13   Wyatt Portela, MD  cholestyramine light (PREVALITE) 4 GM/DOSE powder Take 4 g by mouth daily. 05/31/22   [provider]  cyanocobalamin (VITAMIN B12) 1000 MCG/ML injection Inject 1,000 mcg into the muscle every 14 (fourteen) days. 03/09/22   [provider]  famotidine (PEPCID) 20 MG tablet Take 20 mg by mouth 2 (two) times daily.    [provider]  hydrocortisone (ANUSOL-HC) 2.5 % rectal cream Place 1 Application rectally 2 (two) times daily. Patient taking differently: Place 1 Application rectally 2 (two) times daily as needed for hemorrhoids. 06/08/22   Levin Erp, PA  hydrocortisone (ANUSOL-HC) 25 MG suppository Place 1 suppository (25 mg total) rectally 2 (two) times daily. Patient taking differently: Place 25 mg rectally 2 (two) times daily as needed for hemorrhoids. 06/08/22   Levin Erp, PA  ondansetron (ZOFRAN-ODT) 4 MG disintegrating tablet Take 1 tablet (4 mg total) by mouth every 8 (eight) hours as needed for nausea or vomiting. 05/05/22   Rancour, Annie Main, MD  ondansetron (ZOFRAN-ODT) 8 MG disintegrating tablet Take 8 mg by mouth every 8 (eight) hours as needed for nausea or vomiting.    [provider]  pantoprazole (PROTONIX) 40 MG tablet Take 40 mg by mouth daily. 11/13/18   [provider]  promethazine (PHENERGAN) 25 MG suppository Place 25 mg rectally every 6 (six) hours as needed for nausea or vomiting.    [provider]  SUMAtriptan (IMITREX) 50 MG tablet Take 50 mg by mouth every 2 (two) hours as needed for migraine. 10/25/18   [provider]  SYRINGE-NEEDLE, DISP, 3 ML 25G X 5/8" 3 ML MISC 1,000 mcg by Misc.(Non-Drug; Combo Route) route every 30 days. 12/17/18   [provider]  Vitamin D, Ergocalciferol, (DRISDOL) 1.25 MG (50000 UT) CAPS capsule Take 50,000 Units by mouth every 7 (seven) days. 12/12/18   [provider]  XARELTO 20 MG TABS tablet TAKE 1 TABLET BY MOUTH EVERY DAY 06/07/22   Wyatt Portela, MD    Family History Family History  Problem Relation Age of Onset   Cancer Mother        skin   Hyperlipidemia Mother    Heart disease Father    Colon polyps Father    Miscarriages / Stillbirths Sister    Diabetes Maternal Grandmother    Cancer Maternal Grandmother        breast   Osteoporosis Maternal Grandmother     Glaucoma Maternal Grandmother    Vision loss Maternal Grandmother    Breast cancer Maternal Grandmother    Dementia Maternal Grandmother    Heart disease Maternal Grandfather    Hypertension Maternal Grandfather    Stroke Maternal Grandfather    Dementia Paternal Grandmother    Heart disease Paternal Grandfather    Colon cancer Maternal Uncle    Colon cancer Paternal Aunt     Social History Social History   Tobacco Use   Smoking status: Never   Smokeless tobacco: Never  Substance Use Topics   Alcohol use: No   Drug use: No     Allergies   Ciprofloxacin, Neomycin-bacitracin zn-polymyx,  Bactrim [sulfamethoxazole-trimethoprim], Oxycodone, Amoxicillin, Miconazole nitrate, and Penicillins   Review of Systems Review of Systems  Constitutional:  Negative for chills and fever.  HENT:  Positive for congestion, nosebleeds, postnasal drip, rhinorrhea and sinus pressure. Negative for ear pain and sore throat.   Respiratory:  Negative for cough and shortness of breath.   Cardiovascular:  Negative for chest pain and palpitations.  Gastrointestinal:  Negative for abdominal pain, diarrhea and vomiting.  Genitourinary:  Negative for dysuria and hematuria.  Musculoskeletal:  Positive for back pain. Negative for arthralgias, gait problem and joint swelling.  Skin:  Negative for color change and rash.  Neurological:  Negative for weakness and numbness.  All other systems reviewed and are negative.    Physical Exam Triage Vital Signs ED Triage Vitals  Enc Vitals Group     BP      Pulse      Resp      Temp      Temp src      SpO2      Weight      Height      Head Circumference      Peak Flow      Pain Score      Pain Loc      Pain Edu?      Excl. in [place]?    No data found.  Updated Vital Signs BP 116/83   Pulse (!) 105   Temp 98.1 F (36.7 C)   Resp 18   Ht '5\' 9"'$  (1.753 m)   Wt 249 lb 12.8 oz (113.3 kg)   LMP 05/26/2022 Comment: IUD  SpO2 98%   BMI 36.89 kg/m    Visual Acuity Right Eye Distance:   Left Eye Distance:   Bilateral Distance:    Right Eye Near:   Left Eye Near:    Bilateral Near:     Physical Exam Vitals and nursing note reviewed.  Constitutional:      General: She is not in acute distress.    Appearance: Normal appearance. She is well-developed. She is not ill-appearing.  HENT:     Right Ear: Tympanic membrane normal.     Left Ear: Tympanic membrane normal.     Nose: Congestion present.     Mouth/Throat:     Mouth: Mucous membranes are moist.     Pharynx: Oropharynx is clear.  Cardiovascular:     Rate and Rhythm: Normal rate and regular rhythm.     Heart sounds: Normal heart sounds.  Pulmonary:     Effort: Pulmonary effort is normal. No respiratory distress.     Breath sounds: Normal breath sounds.  Abdominal:     General: Bowel sounds are normal.     Palpations: Abdomen is soft.     Tenderness: There is no abdominal tenderness. There is no right CVA tenderness, left CVA tenderness, guarding or rebound.  Musculoskeletal:        General: No swelling, tenderness, deformity or signs of injury. Normal range of motion.     Cervical back: Neck supple.  Skin:    General: Skin is warm and dry.     Capillary Refill: Capillary refill takes less than 2 seconds.     Findings: No bruising, erythema, lesion or rash.  Neurological:     General: No focal deficit present.     Mental Status: She is alert and oriented to person, place, and time.     Sensory: No sensory deficit.     Motor: No  weakness.     Gait: Gait normal.  Psychiatric:        Mood and Affect: Mood normal.        Behavior: Behavior normal.      UC Treatments / Results  Labs (all labs ordered are listed, but only abnormal results are displayed) Labs Reviewed - No data to display  EKG   Radiology No results found.  Procedures Procedures (including critical care time)  Medications Ordered in UC Medications - No data to display  Initial  Impression / Assessment and Plan / UC Course  I have reviewed the triage vital signs and the nursing notes.  Pertinent labs & imaging results that were available during my care of the patient were reviewed by me and considered in my medical decision making (see chart for details).    Acute sinusitis. Left low back pain with left sciatica.  Treating sinusitis with Zithromax as patient reports this has worked well for her in the past.  Treating sciatica with prednisone as patient reports this has worked for her in the past as well.  Patient is unable to take NSAIDs due to taking Xarelto.  Instructed patient to follow up with her PCP if her symptoms are not improving.  Education provided on sinusitis and sciatica.  She agrees to plan of care.    Final Clinical Impressions(s) / UC Diagnoses   Final diagnoses:  Acute non-recurrent maxillary sinusitis  Acute left-sided low back pain with left-sided sciatica     Discharge Instructions      Take the Zithromax and prednisone as directed.  Follow up with your primary care provider if your symptoms are not improving.        ED Prescriptions     Medication Sig Dispense Auth. Provider   predniSONE (STERAPRED UNI-PAK 21 TAB) 10 MG (21) TBPK tablet Take by mouth daily. As directed 21 tablet Sharion Balloon, NP   azithromycin (ZITHROMAX) 250 MG tablet Take 1 tablet (250 mg total) by mouth daily. Take first 2 tablets together, then 1 every day until finished. 6 tablet Sharion Balloon, NP      PDMP not reviewed this encounter.   Sharion Balloon, NP 07/07/22 [phone]

## 2022-07-07 NOTE — ED Triage Notes (Signed)
Patient to Urgent Care with complaints of sinus pain and swelling. Reports symptoms started Sunday. Has also had several nose bleeds (takes xarelto). Denies any known fevers. Denies cough.   Left sided lower back pain that radiates into left leg. Reports possible injury that started on the weekend after dragging a deer.

## 2022-07-13 ENCOUNTER — Other Ambulatory Visit (HOSPITAL_COMMUNITY): Payer: Self-pay | Admitting: Internal Medicine

## 2022-07-13 ENCOUNTER — Encounter: Payer: Self-pay | Admitting: Gastroenterology

## 2022-07-13 DIAGNOSIS — R935 Abnormal findings on diagnostic imaging of other abdominal regions, including retroperitoneum: Secondary | ICD-10-CM

## 2022-07-13 DIAGNOSIS — R194 Change in bowel habit: Secondary | ICD-10-CM

## 2022-07-13 DIAGNOSIS — R112 Nausea with vomiting, unspecified: Secondary | ICD-10-CM

## 2022-07-13 DIAGNOSIS — R1011 Right upper quadrant pain: Secondary | ICD-10-CM

## 2022-07-19 ENCOUNTER — Encounter (HOSPITAL_COMMUNITY)
Admission: RE | Admit: 2022-07-19 | Discharge: 2022-07-19 | Disposition: A | Discharge status: Home / Self Care | Payer: Commercial Managed Care - HMO | Source: Ambulatory Visit | Attending: Internal Medicine | Admitting: Internal Medicine

## 2022-07-19 DIAGNOSIS — R112 Nausea with vomiting, unspecified: Secondary | ICD-10-CM | POA: Insufficient documentation

## 2022-07-19 DIAGNOSIS — R194 Change in bowel habit: Secondary | ICD-10-CM | POA: Insufficient documentation

## 2022-07-19 DIAGNOSIS — R935 Abnormal findings on diagnostic imaging of other abdominal regions, including retroperitoneum: Secondary | ICD-10-CM | POA: Insufficient documentation

## 2022-07-19 DIAGNOSIS — R1011 Right upper quadrant pain: Secondary | ICD-10-CM | POA: Insufficient documentation

## 2022-07-26 ENCOUNTER — Encounter (HOSPITAL_COMMUNITY)
Admission: RE | Admit: 2022-07-26 | Discharge: 2022-07-26 | Disposition: A | Discharge status: Home / Self Care | Payer: Commercial Managed Care - HMO | Source: Ambulatory Visit | Attending: Internal Medicine | Admitting: Internal Medicine

## 2022-07-26 DIAGNOSIS — R1011 Right upper quadrant pain: Secondary | ICD-10-CM | POA: Insufficient documentation

## 2022-07-26 DIAGNOSIS — R194 Change in bowel habit: Secondary | ICD-10-CM | POA: Insufficient documentation

## 2022-07-26 DIAGNOSIS — R112 Nausea with vomiting, unspecified: Secondary | ICD-10-CM | POA: Diagnosis present

## 2022-07-26 DIAGNOSIS — R935 Abnormal findings on diagnostic imaging of other abdominal regions, including retroperitoneum: Secondary | ICD-10-CM | POA: Insufficient documentation

## 2022-07-26 MED ORDER — TECHNETIUM TC 99M SULFUR COLLOID
2.0000 | Freq: Once | INTRAVENOUS | Status: AC | PRN
  Administered 2022-07-26: 2 via INTRAVENOUS

## 2022-08-10 ENCOUNTER — Telehealth: Payer: Self-pay | Admitting: Oncology

## 2022-08-10 NOTE — Telephone Encounter (Signed)
Called patient regarding upcoming January appointments, patient is notified. 

## 2022-08-14 ENCOUNTER — Ambulatory Visit
Admission: RE | Admit: 2022-08-14 | Discharge: 2022-08-14 | Disposition: A | Discharge status: Home / Self Care | Payer: Commercial Managed Care - HMO | Source: Ambulatory Visit | Attending: Internal Medicine | Admitting: Internal Medicine

## 2022-08-14 ENCOUNTER — Encounter: Payer: Self-pay | Admitting: Oncology

## 2022-08-14 VITALS — BP 125/87 | HR 90 | Temp 98.5°F | Resp 16

## 2022-08-14 DIAGNOSIS — Z113 Encounter for screening for infections with a predominantly sexual mode of transmission: Secondary | ICD-10-CM | POA: Insufficient documentation

## 2022-08-14 DIAGNOSIS — N898 Other specified noninflammatory disorders of vagina: Secondary | ICD-10-CM | POA: Insufficient documentation

## 2022-08-14 NOTE — ED Provider Notes (Signed)
[facility]    CSN: [id number] Arrival date & time: 08/14/22  1734      History   Chief Complaint Chief Complaint  Patient presents with   Vaginal Itching    Entered by patient    HPI Robin Key is a 37 y.o. female.   Patient presents with vaginal itching that started a few days prior.  Patient is not sure if there is any vaginal discharge present given that she just completed her menstrual cycle.  Denies dysuria, urinary frequency, abdominal pain, pelvic pain, back pain, fever.  Denies any confirmed exposure to STD given that she is in a monogamous relationship with her husband.  She used topical medication over-the-counter for yeast infection but is not sure the name of it.   Vaginal Itching    Past Medical History:  Diagnosis Date   Blood dyscrasia    factor 5   DVT (deep vein thrombosis) in pregnancy    Factor V Leiden ([place])    Headache(784.0)    Infection    UTI   PCOS (polycystic ovarian syndrome)    PONV (postoperative nausea and vomiting)    Pulmonary embolism ([place]) 06/08/2011   Varicose veins     Patient Active Problem List   Diagnosis Date Noted   Cholecystitis, chronic 10/18/2015   Cholecystitis 10/15/2015   Leg pain 10/15/2014   Factor V Leiden, prothrombin gene mutation ([place]) 10/15/2014   Varicose veins of lower extremities with other complications 53/61/4431   Pain and swelling of lower extremity 10/08/2013   Anemia 09/08/2013   Personal history of venous thrombosis and embolism 09/08/2013   Cesarean delivery delivered 07/01/2013    Past Surgical History:  Procedure Laterality Date   BIOPSY  06/27/2022   Procedure: BIOPSY;  Surgeon: Clarene Essex, MD;  Location: [facility];  Service: Gastroenterology;;   bladder stem stretched     CESAREAN SECTION N/A 07/01/2013   Procedure: CESAREAN SECTION;  Surgeon: Allena Katz, MD;  Location: [facility];  Service: Obstetrics;  Laterality: N/A;  Primary edc 07/11/13   COLONOSCOPY WITH  PROPOFOL N/A 06/27/2022   Procedure: COLONOSCOPY WITH PROPOFOL;  Surgeon: Clarene Essex, MD;  Location: [facility];  Service: Gastroenterology;  Laterality: N/A;   ESOPHAGOGASTRODUODENOSCOPY (EGD) WITH PROPOFOL N/A 06/27/2022   Procedure: ESOPHAGOGASTRODUODENOSCOPY (EGD) WITH PROPOFOL;  Surgeon: Clarene Essex, MD;  Location: [facility];  Service: Gastroenterology;  Laterality: N/A;    OB History     Gravida  1   Para  1   Term  1   Preterm  0   AB  0   Living  1      SAB  0   IAB  0   Ectopic  0   Multiple  0   Live Births  1            Home Medications    Prior to Admission medications   Medication Sig Start Date End Date Taking? Authorizing Provider  acetaminophen (TYLENOL) 500 MG tablet Take 1,000 mg by mouth every 6 (six) hours as needed for moderate pain.    [provider]  azithromycin (ZITHROMAX) 250 MG tablet Take 1 tablet (250 mg total) by mouth daily. Take first 2 tablets together, then 1 every day until finished. 07/07/22   Sharion Balloon, NP  butalbital-acetaminophen-caffeine (FIORICET, ESGIC) [id number] MG per tablet Take 2 tablets by mouth every 6 (six) hours as needed for headache. 11/06/13   Wyatt Portela, MD  cholestyramine light (  PREVALITE) 4 GM/DOSE powder Take 4 g by mouth daily. 05/31/22   [provider]  cyanocobalamin (VITAMIN B12) 1000 MCG/ML injection Inject 1,000 mcg into the muscle every 14 (fourteen) days. 03/09/22   [provider]  famotidine (PEPCID) 20 MG tablet Take 20 mg by mouth 2 (two) times daily.    [provider]  hydrocortisone (ANUSOL-HC) 2.5 % rectal cream Place 1 Application rectally 2 (two) times daily. Patient taking differently: Place 1 Application rectally 2 (two) times daily as needed for hemorrhoids. 06/08/22   Levin Erp, PA  hydrocortisone (ANUSOL-HC) 25 MG suppository Place 1 suppository (25 mg total) rectally 2 (two) times daily. Patient taking differently: Place 25 mg  rectally 2 (two) times daily as needed for hemorrhoids. 06/08/22   Levin Erp, PA  ondansetron (ZOFRAN-ODT) 4 MG disintegrating tablet Take 1 tablet (4 mg total) by mouth every 8 (eight) hours as needed for nausea or vomiting. 05/05/22   Rancour, Annie Main, MD  ondansetron (ZOFRAN-ODT) 8 MG disintegrating tablet Take 8 mg by mouth every 8 (eight) hours as needed for nausea or vomiting.    [provider]  pantoprazole (PROTONIX) 40 MG tablet Take 40 mg by mouth daily. 11/13/18   [provider]  predniSONE (STERAPRED UNI-PAK 21 TAB) 10 MG (21) TBPK tablet Take by mouth daily. As directed 07/07/22   Sharion Balloon, NP  promethazine (PHENERGAN) 25 MG suppository Place 25 mg rectally every 6 (six) hours as needed for nausea or vomiting.    [provider]  SUMAtriptan (IMITREX) 50 MG tablet Take 50 mg by mouth every 2 (two) hours as needed for migraine. 10/25/18   [provider]  SYRINGE-NEEDLE, DISP, 3 ML 25G X 5/8" 3 ML MISC 1,000 mcg by Misc.(Non-Drug; Combo Route) route every 30 days. 12/17/18   [provider]  Vitamin D, Ergocalciferol, (DRISDOL) 1.25 MG (50000 UT) CAPS capsule Take 50,000 Units by mouth every 7 (seven) days. 12/12/18   [provider]  XARELTO 20 MG TABS tablet TAKE 1 TABLET BY MOUTH EVERY DAY 06/07/22   Wyatt Portela, MD    Family History Family History  Problem Relation Age of Onset   Cancer Mother        skin   Hyperlipidemia Mother    Heart disease Father    Colon polyps Father    Miscarriages / Stillbirths Sister    Diabetes Maternal Grandmother    Cancer Maternal Grandmother        breast   Osteoporosis Maternal Grandmother    Glaucoma Maternal Grandmother    Vision loss Maternal Grandmother    Breast cancer Maternal Grandmother    Dementia Maternal Grandmother    Heart disease Maternal Grandfather    Hypertension Maternal Grandfather    Stroke Maternal Grandfather    Dementia Paternal Grandmother     Heart disease Paternal Grandfather    Colon cancer Maternal Uncle    Colon cancer Paternal Aunt     Social History Social History   Tobacco Use   Smoking status: Never   Smokeless tobacco: Never  Substance Use Topics   Alcohol use: No   Drug use: No     Allergies   Ciprofloxacin, Neomycin-bacitracin zn-polymyx, Bactrim [sulfamethoxazole-trimethoprim], Oxycodone, Amoxicillin, Miconazole nitrate, and Penicillins   Review of Systems Review of Systems Per HPI  Physical Exam Triage Vital Signs ED Triage Vitals [08/14/22 1834]  Enc Vitals Group     BP 125/87     Pulse Rate 90  Resp 16     Temp 98.5 F (36.9 C)     Temp Source Oral     SpO2 100 %     Weight      Height      Head Circumference      Peak Flow      Pain Score 0     Pain Loc      Pain Edu?      Excl. in [place]?    No data found.  Updated Vital Signs BP 125/87 (BP Location: Left Arm)   Pulse 90   Temp 98.5 F (36.9 C) (Oral)   Resp 16   SpO2 100%   Breastfeeding No   Visual Acuity Right Eye Distance:   Left Eye Distance:   Bilateral Distance:    Right Eye Near:   Left Eye Near:    Bilateral Near:     Physical Exam Constitutional:      General: She is not in acute distress.    Appearance: Normal appearance. She is not toxic-appearing or diaphoretic.  HENT:     Head: Normocephalic and atraumatic.  Eyes:     Extraocular Movements: Extraocular movements intact.     Conjunctiva/sclera: Conjunctivae normal.  Pulmonary:     Effort: Pulmonary effort is normal.  Genitourinary:    Comments: Deferred with shared decision making.  Self swab performed. Neurological:     General: No focal deficit present.     Mental Status: She is alert and oriented to person, place, and time. Mental status is at baseline.  Psychiatric:        Mood and Affect: Mood normal.        Behavior: Behavior normal.        Thought Content: Thought content normal.        Judgment: Judgment normal.      UC  Treatments / Results  Labs (all labs ordered are listed, but only abnormal results are displayed) Labs Reviewed  CERVICOVAGINAL ANCILLARY ONLY    EKG   Radiology No results found.  Procedures Procedures (including critical care time)  Medications Ordered in UC Medications - No data to display  Initial Impression / Assessment and Plan / UC Course  I have reviewed the triage vital signs and the nursing notes.  Pertinent labs & imaging results that were available during my care of the patient were reviewed by me and considered in my medical decision making (see chart for details).     Patient presenting with vaginal itching and cervicovaginal swab is pending.  Highly suspicious of yeast infection but given topical medication was not very beneficial, will await results for further treatment.  Patient is open to STD testing so vaginal swab assessing for this as well.  UA deferred given no urinary symptoms.  Urine pregnancy deferred given patient just completed her menstrual cycle.  If vaginal swab is positive for yeast infection, patient is not able to take Diflucan so a topical medication would need to be prescribed.  Discussed return precautions.  Patient verbalized understanding and was agreeable with plan. Final Clinical Impressions(s) / UC Diagnoses   Final diagnoses:  Vaginal itching  Screening examination for venereal disease     Discharge Instructions      Vaginal swab is pending.  We will call if it is positive and treat as appropriate.  We ask that you refrain from sexual activity until test results and treatment are complete.  Please follow-up if any symptoms persist or worsen.  ED Prescriptions   None    PDMP not reviewed this encounter.   Teodora Medici, [place] 08/14/22 [phone]

## 2022-08-14 NOTE — ED Triage Notes (Signed)
Pt c/o vaginal itching onset ~ 5 days ago. States she recently had a prescription change and concerned there may be an association.

## 2022-08-14 NOTE — Discharge Instructions (Signed)
Vaginal swab is pending.  We will call if it is positive and treat as appropriate.  We ask that you refrain from sexual activity until test results and treatment are complete.  Please follow-up if any symptoms persist or worsen.

## 2022-08-16 ENCOUNTER — Encounter: Payer: Self-pay | Admitting: Nurse Practitioner

## 2022-08-16 ENCOUNTER — Encounter: Payer: Self-pay | Admitting: Radiology

## 2022-08-16 ENCOUNTER — Encounter: Payer: Self-pay | Admitting: Oncology

## 2022-08-16 ENCOUNTER — Other Ambulatory Visit: Payer: Self-pay | Admitting: *Deleted

## 2022-08-16 ENCOUNTER — Ambulatory Visit (INDEPENDENT_AMBULATORY_CARE_PROVIDER_SITE_OTHER): Payer: Commercial Managed Care - HMO | Admitting: Radiology

## 2022-08-16 ENCOUNTER — Encounter: Payer: Commercial Managed Care - HMO | Admitting: Nurse Practitioner

## 2022-08-16 VITALS — BP 124/80

## 2022-08-16 DIAGNOSIS — Z113 Encounter for screening for infections with a predominantly sexual mode of transmission: Secondary | ICD-10-CM | POA: Diagnosis not present

## 2022-08-16 DIAGNOSIS — R300 Dysuria: Secondary | ICD-10-CM

## 2022-08-16 DIAGNOSIS — N760 Acute vaginitis: Secondary | ICD-10-CM

## 2022-08-16 DIAGNOSIS — Z30431 Encounter for routine checking of intrauterine contraceptive device: Secondary | ICD-10-CM

## 2022-08-16 LAB — CERVICOVAGINAL ANCILLARY ONLY
Bacterial Vaginitis (gardnerella): NEGATIVE
Candida Glabrata: NEGATIVE
Candida Vaginitis: NEGATIVE
Chlamydia: NEGATIVE
Comment: NEGATIVE
Comment: NEGATIVE
Comment: NEGATIVE
Comment: NEGATIVE
Comment: NEGATIVE
Comment: NORMAL
Neisseria Gonorrhea: NEGATIVE
Trichomonas: NEGATIVE

## 2022-08-16 LAB — URINALYSIS W MICROSCOPIC + REFLEX CULTURE
Bacteria, UA: NONE SEEN /HPF
Bilirubin Urine: NEGATIVE
Glucose, UA: NEGATIVE
Hgb urine dipstick: NEGATIVE
Hyaline Cast: NONE SEEN /LPF
Ketones, ur: NEGATIVE
Leukocyte Esterase: NEGATIVE
Nitrites, Initial: NEGATIVE
Protein, ur: NEGATIVE
RBC / HPF: NONE SEEN /HPF (ref 0–2)
Specific Gravity, Urine: 1.02 (ref 1.001–1.035)
WBC, UA: NONE SEEN /HPF (ref 0–5)
pH: 5.5 (ref 5.0–8.0)

## 2022-08-16 LAB — NO CULTURE INDICATED

## 2022-08-16 LAB — WET PREP FOR TRICH, YEAST, CLUE

## 2022-08-16 MED ORDER — TERCONAZOLE 0.4 % VA CREA: 1.0000 | TOPICAL_CREAM | Freq: Every day | VAGINAL | 0 refills | Status: DC

## 2022-08-16 NOTE — Addendum Note (Signed)
Addended by: Rubbie Battiest on: 08/16/2022 02:53 PM   Modules accepted: Orders

## 2022-08-16 NOTE — Progress Notes (Signed)
      Subjective: Robin Key is a 37 y.o. female who complains of vaginal d/c, thick, white and chunky, tried terazol with no relief last night, reports it was out of date, odor and brown d/c at times. Periods are frequent and heavy, concerned about IUD placement. Would like STI screen including throat sureswab.   Review of Systems  All other systems reviewed and are negative.   Past Medical History:  Diagnosis Date   Blood dyscrasia    factor 5   DVT (deep vein thrombosis) in pregnancy    Factor V Leiden ([place])    Headache(784.0)    Infection    UTI   PCOS (polycystic ovarian syndrome)    PONV (postoperative nausea and vomiting)    Pulmonary embolism ([place]) 06/08/2011   Varicose veins       Objective:  Today's Vitals   08/16/22 1413  BP: 124/80   There is no height or weight on file to calculate BMI.   -General: no acute distress -Vulva: without lesions or discharge -Vagina: discharge present, aptima swab and wet prep obtained -Cervix: no lesion or discharge, no CMT -Perineum: no lesions -Uterus: Mobile, non tender -Adnexa: no masses or tenderness   Microscopic wet-mount exam shows negative for pathogens, normal epithelial cells. Patient had used terazol last night.    Chaperone offered and declined.  Assessment:/Plan:   1. Acute vaginitis - SURESWAB CT/NG/T. vaginalis Rx sent for terazol- had used out of date Rx last night x 1 dose  2. Screening for STDs (sexually transmitted diseases) - SURESWAB CT/NG/T. vaginalis; throat - HIV antibody (with reflex) - RPR - Hepatitis B Surface AntiGEN - Hepatitis C antibody  3. IUD check up String very long, heavy periods, will order u/s for placement - US Transvaginal Non-OB; Future    Will contact patient with results of testing completed today. Avoid intercourse until symptoms are resolved. Safe sex encouraged. Avoid the use of soaps or perfumed products in the peri area. Avoid tub baths and sitting in sweaty  or wet clothing for prolonged periods of time.

## 2022-08-17 ENCOUNTER — Ambulatory Visit (INDEPENDENT_AMBULATORY_CARE_PROVIDER_SITE_OTHER): Payer: Commercial Managed Care - HMO | Admitting: Radiology

## 2022-08-17 ENCOUNTER — Encounter: Payer: Self-pay | Admitting: Radiology

## 2022-08-17 ENCOUNTER — Ambulatory Visit (INDEPENDENT_AMBULATORY_CARE_PROVIDER_SITE_OTHER): Payer: Commercial Managed Care - HMO

## 2022-08-17 ENCOUNTER — Encounter: Payer: Self-pay | Admitting: Oncology

## 2022-08-17 DIAGNOSIS — Z30430 Encounter for insertion of intrauterine contraceptive device: Secondary | ICD-10-CM

## 2022-08-17 DIAGNOSIS — T8332XA Displacement of intrauterine contraceptive device, initial encounter: Secondary | ICD-10-CM | POA: Diagnosis not present

## 2022-08-17 DIAGNOSIS — Z30431 Encounter for routine checking of intrauterine contraceptive device: Secondary | ICD-10-CM

## 2022-08-17 LAB — HIV ANTIBODY (ROUTINE TESTING W REFLEX): HIV 1&2 Ab, 4th Generation: NONREACTIVE

## 2022-08-17 LAB — RPR: RPR Ser Ql: NONREACTIVE

## 2022-08-17 LAB — HEPATITIS B SURFACE ANTIGEN: Hepatitis B Surface Ag: NONREACTIVE

## 2022-08-17 LAB — HEPATITIS C ANTIBODY: Hepatitis C Ab: NONREACTIVE

## 2022-08-17 NOTE — Progress Notes (Signed)
   Robin Key [birth year] [id number]   History:  37 y.o. G1P1 presents for removal of Mirena IUD.  Reason for discontinuation: malpositioned in the lower uterine segment, seen on u/s Pt has been counseled about risks and benefits as well as complications.  Consent is obtained today.  Patient's last menstrual period was 08/03/2022. STD testing: obtained yesterday  Past medical history, past surgical history, family history and social history were all reviewed and documented in the EPIC chart.  ROS:  A ROS was performed and pertinent positives and negatives are included.  Exam: There were no vitals filed for this visit. There is no height or weight on file to calculate BMI.  Pelvic exam: Vulva:  normal female genitalia Vagina:  normal vagina, no discharge, exudate, lesion, or erythema Cervix:  Non-tender, Negative CMT, no lesions or redness. IUD strings visualized. Uterus:  normal shape, position and consistency    Procedure:  Speculum inserted.   Cervix visualized, IUD grasps with forceps and removed without difficulty. Pt tolerated procedure well.  Chaperone, Leatrice Jewels, CMA was present for the entirety of the procedure   Assessment/Plan: 1. Malpositioned intrauterine device (IUD), initial encounter IUD removed easily  2. Encounter for insertion of intrauterine contraceptive device (IUD) Will schedule Mirena insert for next week - IUD Insertion; Future    Kerry Dory WHNP-BC, 4:34 PM 08/17/2022

## 2022-08-17 NOTE — Progress Notes (Signed)
Erroneous encounter

## 2022-08-18 ENCOUNTER — Other Ambulatory Visit: Payer: Self-pay

## 2022-08-18 ENCOUNTER — Inpatient Hospital Stay: Payer: Commercial Managed Care - HMO

## 2022-08-18 ENCOUNTER — Inpatient Hospital Stay: Payer: Commercial Managed Care - HMO | Attending: Oncology | Admitting: Oncology

## 2022-08-18 VITALS — BP 123/76 | HR 69 | Temp 98.0°F | Resp 18 | Ht 69.0 in | Wt 249.5 lb

## 2022-08-18 DIAGNOSIS — Z7901 Long term (current) use of anticoagulants: Secondary | ICD-10-CM | POA: Diagnosis not present

## 2022-08-18 DIAGNOSIS — N920 Excessive and frequent menstruation with regular cycle: Secondary | ICD-10-CM | POA: Insufficient documentation

## 2022-08-18 DIAGNOSIS — Z885 Allergy status to narcotic agent status: Secondary | ICD-10-CM | POA: Diagnosis not present

## 2022-08-18 DIAGNOSIS — D500 Iron deficiency anemia secondary to blood loss (chronic): Secondary | ICD-10-CM

## 2022-08-18 DIAGNOSIS — Z883 Allergy status to other anti-infective agents status: Secondary | ICD-10-CM | POA: Insufficient documentation

## 2022-08-18 DIAGNOSIS — Z880 Allergy status to penicillin: Secondary | ICD-10-CM | POA: Diagnosis not present

## 2022-08-18 DIAGNOSIS — Z881 Allergy status to other antibiotic agents status: Secondary | ICD-10-CM | POA: Diagnosis not present

## 2022-08-18 DIAGNOSIS — D6851 Activated protein C resistance: Secondary | ICD-10-CM | POA: Diagnosis present

## 2022-08-18 DIAGNOSIS — Z79899 Other long term (current) drug therapy: Secondary | ICD-10-CM | POA: Diagnosis not present

## 2022-08-18 LAB — CBC WITH DIFFERENTIAL (CANCER CENTER ONLY)
Abs Immature Granulocytes: 0.03 10*3/uL (ref 0.00–0.07)
Basophils Absolute: 0.1 10*3/uL (ref 0.0–0.1)
Basophils Relative: 1 %
Eosinophils Absolute: 0.2 10*3/uL (ref 0.0–0.5)
Eosinophils Relative: 2 %
HCT: 38.3 % (ref 36.0–46.0)
Hemoglobin: 13.3 g/dL (ref 12.0–15.0)
Immature Granulocytes: 0 %
Lymphocytes Relative: 18 %
Lymphs Abs: 1.5 10*3/uL (ref 0.7–4.0)
MCH: 30.9 pg (ref 26.0–34.0)
MCHC: 34.7 g/dL (ref 30.0–36.0)
MCV: 88.9 fL (ref 80.0–100.0)
Monocytes Absolute: 0.6 10*3/uL (ref 0.1–1.0)
Monocytes Relative: 7 %
Neutro Abs: 6.1 10*3/uL (ref 1.7–7.7)
Neutrophils Relative %: 72 %
Platelet Count: 257 10*3/uL (ref 150–400)
RBC: 4.31 MIL/uL (ref 3.87–5.11)
RDW: 12.5 % (ref 11.5–15.5)
WBC Count: 8.5 10*3/uL (ref 4.0–10.5)
nRBC: 0 % (ref 0.0–0.2)

## 2022-08-18 LAB — FERRITIN: Ferritin: 119 ng/mL (ref 11–307)

## 2022-08-18 LAB — IRON AND IRON BINDING CAPACITY (CC-WL,HP ONLY)
Iron: 106 ug/dL (ref 28–170)
Saturation Ratios: 36 % — ABNORMAL HIGH (ref 10.4–31.8)
TIBC: 298 ug/dL (ref 250–450)
UIBC: 192 ug/dL (ref 148–442)

## 2022-08-18 LAB — SURESWAB CT/NG/T. VAGINALIS
C. trachomatis RNA, TMA: NOT DETECTED
N. gonorrhoeae RNA, TMA: NOT DETECTED
Trichomonas vaginalis RNA: NOT DETECTED

## 2022-08-18 NOTE — Progress Notes (Addendum)
Hematology and Oncology Follow Up Visit  Robin Key [id number] [birth year] 37 y.o. 08/18/2022 3:07 PM Robin Key, [name], Melissa Key*   Principle Diagnosis: 37 year old woman with venous thromboembolism diagnosed in 2012.  He was found to have this heterozygous factor V Leiden.     Secondary diagnosis: Iron deficiency anemia related to chronic menstrual cycle     Prior Therapy:   Full dose anticoagulation with Lovenox after she had a C-section on 07/01/2013.   She is status post IV iron infusion as needed.  Last treatment was in November 2021.  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 She was switched to Lovenox in June 2018.  Therapy discontinued in March 2019.  Xarelto 20 mg daily started in June 2019.  She has not been taking it leading up to December 2022.  Current therapy:   Xarelto 20 mg daily restarted in December 2020 and to be continued indefinitely.   Interim History:  [name] returns today for a follow-up.  Since last visit, she reports no major changes in her health.  She has lost weight with recurrent GI issues with workup has been unrevealing.  CT scan, colonoscopy and endoscopy did not reveal any abnormalities.  She denies any chest pain or shortness of breath.  She denies any excessive fatigue and tiredness or hematochezia.     Medications: Reviewed no changes. Current Outpatient Medications  Medication Sig Dispense Refill   acetaminophen (TYLENOL) 500 MG tablet Take 1,000 mg by mouth every 6 (six) hours as needed for moderate pain.     butalbital-acetaminophen-caffeine (FIORICET, ESGIC) 50-325-40 MG per tablet Take 2 tablets by mouth every 6 (six) hours as needed for headache. 30 tablet 1   cholestyramine light (PREVALITE) 4 GM/DOSE powder Take 4 g by mouth daily.     cyanocobalamin (VITAMIN B12) 1000 MCG/ML injection Inject 1,000 mcg into the muscle every 14 (fourteen)  days.     famotidine (PEPCID) 20 MG tablet Take 20 mg by mouth 2 (two) times daily.     hydrocortisone (ANUSOL-HC) 2.5 % rectal cream Place 1 Application rectally 2 (two) times daily. (Patient taking differently: Place 1 Application rectally 2 (two) times daily as needed for hemorrhoids.) 30 g 0   hydrocortisone (ANUSOL-HC) 25 MG suppository Place 1 suppository (25 mg total) rectally 2 (two) times daily. (Patient taking differently: Place 25 mg rectally 2 (two) times daily as needed for hemorrhoids.) 12 suppository 0   ondansetron (ZOFRAN-ODT) 4 MG disintegrating tablet Take 1 tablet (4 mg total) by mouth every 8 (eight) hours as needed for nausea or vomiting. 20 tablet 0   ondansetron (ZOFRAN-ODT) 8 MG disintegrating tablet Take 8 mg by mouth every 8 (eight) hours as needed for nausea or vomiting.     pantoprazole (PROTONIX) 40 MG tablet Take 40 mg by mouth daily.     predniSONE (STERAPRED UNI-PAK 21 TAB) 10 MG (21) TBPK tablet Take by mouth daily. As directed 21 tablet 0   promethazine (PHENERGAN) 25 MG suppository Place 25 mg rectally every 6 (six) hours as needed for nausea or vomiting.     SUMAtriptan (IMITREX) 50 MG tablet Take 50 mg by mouth every 2 (two) hours as needed for migraine.     SYRINGE-NEEDLE, DISP, 3 ML 25G X 5/8" 3 ML MISC 1,000 mcg by Misc.(Non-Drug; Combo Route) route every 30 days.     terconazole (TERAZOL 7) 0.4 % vaginal cream Place 1 applicator vaginally at bedtime. 45 g  0   Vitamin D, Ergocalciferol, (DRISDOL) 1.25 MG (50000 UT) CAPS capsule Take 50,000 Units by mouth every 7 (seven) days.     XARELTO 20 MG TABS tablet TAKE 1 TABLET BY MOUTH EVERY DAY 90 tablet 0   No current facility-administered medications for this visit.     Allergies:  Allergies  Allergen Reactions   Ciprofloxacin Other (See Comments)    Felt like "bugs crawling" when on Lovenox   Neomycin-Bacitracin Zn-Polymyx Rash   Bactrim [Sulfamethoxazole-Trimethoprim] Other (See Comments)    Headache     Fluconazole    Oxycodone Nausea And Vomiting   Amoxicillin Rash   Miconazole Nitrate Rash   Penicillins Rash      Physical Exam:         Blood pressure 123/76, pulse 69, temperature 98 F (36.7 C), temperature source Temporal, resp. rate 18, height '5\' 9"'$  (1.753 m), weight 249 lb 8 oz (113.2 kg), last menstrual period 08/03/2022, SpO2 100 %.       ECOG: 0   General appearance: Alert, awake without any distress. Head: Atraumatic without abnormalities Oropharynx: Without any thrush or ulcers. Eyes: No scleral icterus. Lymph nodes: No lymphadenopathy noted in the cervical, supraclavicular, or axillary nodes Heart:regular rate and rhythm, without any murmurs or gallops.   Lung: Clear to auscultation without any rhonchi, wheezes or dullness to percussion. Abdomin: Soft, nontender without any shifting dullness or ascites. Musculoskeletal: No clubbing or cyanosis. Neurological: No motor or sensory deficits. Skin: No rashes or lesions.                   Lab Results: Lab Results  Component Value Date   WBC 6.6 06/21/2022   HGB 13.9 06/21/2022   HCT 41.5 06/21/2022   MCV 89.1 06/21/2022   PLT 245 06/21/2022     Chemistry      Component Value Date/Time   NA 138 05/05/2022 1445   NA 139 08/27/2015 1411   K 3.8 05/05/2022 1445   K 3.9 08/27/2015 1411   CL 105 05/05/2022 1445   CO2 25 05/05/2022 1445   CO2 28 08/27/2015 1411   BUN 9 05/05/2022 1445   BUN 11.5 08/27/2015 1411   CREATININE 0.90 05/05/2022 1445   CREATININE 0.9 08/27/2015 1411      Component Value Date/Time   CALCIUM 9.3 05/05/2022 1445   CALCIUM 9.7 08/27/2015 1411   ALKPHOS 91 05/05/2022 1445   ALKPHOS 100 08/27/2015 1411   AST 14 (L) 05/05/2022 1445   AST 17 08/27/2015 1411   ALT 19 05/05/2022 1445   ALT 18 08/27/2015 1411   BILITOT 1.0 05/05/2022 1445   BILITOT 0.79 08/27/2015 1411     Iron/TIBC/Ferritin/ %Sat    Component Value Date/Time   IRON 120 06/21/2022 0957    IRON 72 03/14/2017 1510   TIBC 308 06/21/2022 0957   TIBC 263 03/14/2017 1510   FERRITIN 108 06/21/2022 0958   FERRITIN 321 (H) 03/14/2017 1510   IRONPCTSAT 39 (H) 06/21/2022 0957   IRONPCTSAT 28 03/14/2017 1510    Impression and Plan:  37 year old woman with:   1.  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  This is related to heterozygous factor V Leiden mutation.    She is currently on Xarelto indefinitely without any major complications.  She has not had any significant menstrual bleeding to warrant any discontinuation or dose reduction.  This could be considered in the future if needed.  After discussion today  she will continue the same dose and schedule without any signs and symptoms of bleeding.  2.  Iron deficiency anemia: Iron studies obtained in November 2023 were within normal range with normal hemoglobin.  Will continue to monitor especially with chronic anticoagulation and replace with IV iron as needed.  Hemoglobin is adequate today and iron studies are currently pending.  3.  Heavy menstrual cycles: She has an IUD in place without any excessive bleeding.  Her IUD will be replaced in the near future.  4.  GI complaints and weight loss: Her workup has been unrevealing including colonoscopy, endoscopy and imaging studies.  She continues to follow with GI regarding this issue.  5.  Plastic surgery planning: She is considering plastic surgery in the future that include extra skin removal liposuction among others.  Her Xarelto will need to be withheld for 48 hours at least prior to her surgery and resumed afterwards.  Clear instructions will be given once her surgery date and description is finalized.  6.  Follow-up: In 3 months for a follow-up visit.  30  minutes were dedicated to this encounter.  The time was spent on updating disease status, treatment choices and outlining future plan of care review.    Zola Button, MD 08/18/22

## 2022-08-20 LAB — SURESWAB CT/NG/T. VAGINALIS

## 2022-08-21 LAB — GC/CHLAMYDIA PROBE, AMP (THROAT)
Chlamydia trachomatis RNA: NOT DETECTED
Neisseria gonorrhoeae RNA: NOT DETECTED

## 2022-08-23 ENCOUNTER — Ambulatory Visit (INDEPENDENT_AMBULATORY_CARE_PROVIDER_SITE_OTHER): Payer: Commercial Managed Care - HMO | Admitting: Radiology

## 2022-08-23 VITALS — BP 120/76

## 2022-08-23 DIAGNOSIS — Z30430 Encounter for insertion of intrauterine contraceptive device: Secondary | ICD-10-CM

## 2022-08-23 DIAGNOSIS — Z01812 Encounter for preprocedural laboratory examination: Secondary | ICD-10-CM

## 2022-08-23 LAB — PREGNANCY, URINE: Preg Test, Ur: NEGATIVE

## 2022-08-23 MED ORDER — LEVONORGESTREL 20 MCG/DAY IU IUD: 1.0000 | INTRAUTERINE_SYSTEM | Freq: Once | INTRAUTERINE | Status: AC

## 2022-08-23 NOTE — Progress Notes (Signed)
   Robin Key 1986/06/28 [id number]   History:  37 y.o. G1P1 presents for insertion of Mirena IUD.  Pt has been counseled about risks and benefits as well as complications.  Consent is obtained today.  Patient's last menstrual period was 08/03/2022. GC/CT/Trich testing: negative  Past medical history, past surgical history, family history and social history were all reviewed and documented in the EPIC chart.  ROS:  A ROS was performed and pertinent positives and negatives are included.  Exam: Vitals:   08/23/22 1332  BP: 120/76   There is no height or weight on file to calculate BMI.  Pelvic exam: Vulva:  normal female genitalia Vagina:  normal vagina, no discharge, exudate, lesion, or erythema Cervix:  Non-tender, Negative CMT, no lesions or redness. Uterus:  normal shape, position and consistency    Procedure:  Speculum inserted.   Cervix visualized and cleansed with Betadine x 3.  Tenaculum placed on anterior cervix. Then uterus sounded to 9 cm. IUD inserted easily. Strings trimmed to 3 cm.  Minimal bleeding noted.  Pt tolerated the procedure well.  Chaperone present: Leatrice Jewels, CMA   Assessment/Plan:  Insertion of Mirena  IUD             UPT neg   Return for recheck 4-6 weeks Pt aware to call for any concerns Pt aware removal due no later than 08/23/2030, IUD card given to pt.   Myleigh Amara B WHNP-BC, 1:51 PM 08/23/2022

## 2022-09-13 ENCOUNTER — Other Ambulatory Visit: Payer: Self-pay | Admitting: Nurse Practitioner

## 2022-09-13 ENCOUNTER — Telehealth: Payer: Self-pay | Admitting: *Deleted

## 2022-09-13 DIAGNOSIS — N939 Abnormal uterine and vaginal bleeding, unspecified: Secondary | ICD-10-CM

## 2022-09-13 MED ORDER — MEDROXYPROGESTERONE ACETATE 5 MG PO TABS: 5.0000 mg | ORAL_TABLET | Freq: Every day | ORAL | 0 refills | Status: DC

## 2022-09-13 NOTE — Telephone Encounter (Signed)
Please reassure her that bleeding after IUD insertion is normal. Bleeding may range from light to heavy and irregular bleeding may occur first 3-6 months. Ibuprofen 600-800 mg every 8 hours recommended as this can also decrease bleeding by up to 40%. If heavy bleeding continues, I recommend she see Jami next week.

## 2022-09-13 NOTE — Telephone Encounter (Signed)
Pt called with complaints of vaginal bleeding x 2 weeks. Heavy flow. Abdominal pain, severity 5, no discharge, itching, no odor. Pt noticed vaginal bleeding after IUD placement 08/23/2022. Pt request treatment over phone. Patient understands Provider may request pt to be seen in office. Routing to Provider for recommendations

## 2022-09-13 NOTE — Telephone Encounter (Signed)
Thanks for clarifying. Provera 5 mg daily x 10 days sent to her pharmacy.

## 2022-09-13 NOTE — Telephone Encounter (Signed)
Pt aware of prescription 

## 2022-09-13 NOTE — Telephone Encounter (Signed)
Pt request Provera to stop bleeding. Due to Xarelto and ibuprofen contraindication . Routing to Provider for recommendations.

## 2022-10-06 ENCOUNTER — Ambulatory Visit (INDEPENDENT_AMBULATORY_CARE_PROVIDER_SITE_OTHER): Payer: Commercial Managed Care - HMO | Admitting: Radiology

## 2022-10-06 ENCOUNTER — Encounter: Payer: Self-pay | Admitting: Radiology

## 2022-10-06 VITALS — BP 102/64

## 2022-10-06 DIAGNOSIS — Z30431 Encounter for routine checking of intrauterine contraceptive device: Secondary | ICD-10-CM | POA: Diagnosis not present

## 2022-10-06 NOTE — Progress Notes (Signed)
     History:  37 y.o. G1P1001 here today for today for IUD string check; Mirena IUD was placed  1/17/224. Had some heavy bleeding the first 2 weeks after placement, resolved with provera. Concerned it may have moved.  The following portions of the patient's history were reviewed and updated as appropriate: allergies, current medications, past family history, past medical history, past social history, past surgical history and problem list.   Review of Systems:  Pertinent items are noted in HPI.   Objective:  Physical Exam Blood pressure 102/64. Gen: NAD Abd: Soft, nontender and nondistended Pelvic: Normal appearing external genitalia; normal appearing vaginal mucosa and cervix.  IUD strings visualized, about 3 cm in length outside cervix.  Chaperone offered and declined.  Assessment & Plan:  Normal IUD check. Will schedule u/s to confirm placement. Patient may keep IUD in place for up to 8 years. May remover sooner  if she desires pregnancy, or has side effects within that time.   Rubbie Battiest, [facility]

## 2022-11-16 ENCOUNTER — Ambulatory Visit (INDEPENDENT_AMBULATORY_CARE_PROVIDER_SITE_OTHER): Payer: Commercial Managed Care - HMO

## 2022-11-16 ENCOUNTER — Ambulatory Visit (INDEPENDENT_AMBULATORY_CARE_PROVIDER_SITE_OTHER): Payer: Commercial Managed Care - HMO | Admitting: Radiology

## 2022-11-16 VITALS — BP 124/76

## 2022-11-16 DIAGNOSIS — Z30431 Encounter for routine checking of intrauterine contraceptive device: Secondary | ICD-10-CM

## 2022-11-16 DIAGNOSIS — N921 Excessive and frequent menstruation with irregular cycle: Secondary | ICD-10-CM | POA: Diagnosis not present

## 2022-11-16 DIAGNOSIS — Z975 Presence of (intrauterine) contraceptive device: Secondary | ICD-10-CM

## 2022-11-16 NOTE — Progress Notes (Signed)
     History:  37 y.o. G1P1001 here today for today for IUD string check ultrasound, concerned IUD moved because she has a h/o expelled IUDs and has had some recent heavy bleeding.  The following portions of the patient's history were reviewed and updated as appropriate: allergies, current medications, past family history, past medical history, past social history, past surgical history and problem list.   Review of Systems:  Pertinent items are noted in HPI.   Objective:  Physical Exam Blood pressure 124/76. Narrative & Impression  Vaginal u/s:   Anteverted uterus Normal size and shape   Thin, symmetrical endometrium   IUD noted centrally within the endometrial canal   Both ovaries mobile, normal size with normal follicle pattern and perfusion   No adnexal masses         Assessment & Plan:   1. IUD (intrauterine device) in place Reassured Schedule AEX  Arlie Solomons, [facility]

## 2022-11-17 ENCOUNTER — Inpatient Hospital Stay: Payer: Commercial Managed Care - HMO | Attending: Oncology

## 2022-11-17 ENCOUNTER — Inpatient Hospital Stay (HOSPITAL_BASED_OUTPATIENT_CLINIC_OR_DEPARTMENT_OTHER): Payer: Commercial Managed Care - HMO | Admitting: Hematology and Oncology

## 2022-11-17 ENCOUNTER — Telehealth: Payer: Self-pay | Admitting: Hematology and Oncology

## 2022-11-17 ENCOUNTER — Other Ambulatory Visit (HOSPITAL_BASED_OUTPATIENT_CLINIC_OR_DEPARTMENT_OTHER): Payer: Commercial Managed Care - HMO | Admitting: Hematology and Oncology

## 2022-11-17 ENCOUNTER — Other Ambulatory Visit: Payer: Self-pay

## 2022-11-17 VITALS — BP 121/72 | HR 54 | Temp 98.4°F | Resp 14 | Wt 249.1 lb

## 2022-11-17 DIAGNOSIS — Z7901 Long term (current) use of anticoagulants: Secondary | ICD-10-CM | POA: Diagnosis not present

## 2022-11-17 DIAGNOSIS — D6851 Activated protein C resistance: Secondary | ICD-10-CM | POA: Insufficient documentation

## 2022-11-17 DIAGNOSIS — D500 Iron deficiency anemia secondary to blood loss (chronic): Secondary | ICD-10-CM

## 2022-11-17 DIAGNOSIS — N920 Excessive and frequent menstruation with regular cycle: Secondary | ICD-10-CM | POA: Insufficient documentation

## 2022-11-17 LAB — CMP (CANCER CENTER ONLY)
ALT: 20 U/L (ref 0–44)
AST: 15 U/L (ref 15–41)
Albumin: 4.5 g/dL (ref 3.5–5.0)
Alkaline Phosphatase: 120 U/L (ref 38–126)
Anion gap: 7 (ref 5–15)
BUN: 6 mg/dL (ref 6–20)
CO2: 30 mmol/L (ref 22–32)
Calcium: 9.7 mg/dL (ref 8.9–10.3)
Chloride: 103 mmol/L (ref 98–111)
Creatinine: 0.85 mg/dL (ref 0.44–1.00)
GFR, Estimated: >60 mL/min (ref >60)
Glucose, Bld: 101 mg/dL — ABNORMAL HIGH (ref 70–99)
Potassium: 3.7 mmol/L (ref 3.5–5.1)
Sodium: 140 mmol/L (ref 135–145)
Total Bilirubin: 0.5 mg/dL (ref 0.3–1.2)
Total Protein: 7.5 g/dL (ref 6.5–8.1)

## 2022-11-17 LAB — IRON AND IRON BINDING CAPACITY (CC-WL,HP ONLY)
Iron: 80 ug/dL (ref 28–170)
Saturation Ratios: 27 % (ref 10.4–31.8)
TIBC: 294 ug/dL (ref 250–450)
UIBC: 214 ug/dL (ref 148–442)

## 2022-11-17 LAB — CBC WITH DIFFERENTIAL (CANCER CENTER ONLY)
Abs Immature Granulocytes: 0.02 10*3/uL (ref 0.00–0.07)
Basophils Absolute: 0 10*3/uL (ref 0.0–0.1)
Basophils Relative: 1 %
Eosinophils Absolute: 0.1 10*3/uL (ref 0.0–0.5)
Eosinophils Relative: 1 %
HCT: 40.6 % (ref 36.0–46.0)
Hemoglobin: 13.9 g/dL (ref 12.0–15.0)
Immature Granulocytes: 0 %
Lymphocytes Relative: 17 %
Lymphs Abs: 1.4 10*3/uL (ref 0.7–4.0)
MCH: 30.2 pg (ref 26.0–34.0)
MCHC: 34.2 g/dL (ref 30.0–36.0)
MCV: 88.1 fL (ref 80.0–100.0)
Monocytes Absolute: 0.4 10*3/uL (ref 0.1–1.0)
Monocytes Relative: 5 %
Neutro Abs: 6 10*3/uL (ref 1.7–7.7)
Neutrophils Relative %: 76 %
Platelet Count: 292 10*3/uL (ref 150–400)
RBC: 4.61 MIL/uL (ref 3.87–5.11)
RDW: 12.3 % (ref 11.5–15.5)
WBC Count: 7.9 10*3/uL (ref 4.0–10.5)
nRBC: 0 % (ref 0.0–0.2)

## 2022-11-17 LAB — RETIC PANEL
Immature Retic Fract: 8.2 % (ref 2.3–15.9)
RBC.: 4.73 MIL/uL (ref 3.87–5.11)
Retic Count, Absolute: 54.9 10*3/uL (ref 19.0–186.0)
Retic Ct Pct: 1.2 % (ref 0.4–3.1)
Reticulocyte Hemoglobin: 33.1 pg (ref >27.9)

## 2022-11-17 LAB — FERRITIN: Ferritin: 78 ng/mL (ref 11–307)

## 2022-11-17 NOTE — Progress Notes (Unsigned)
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 Clelia Croft 11/17/2022: transition care to Dr. Leonides Schanz   Interval History:  Robin Key 37 y.o. 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 The patient's last visit was on 08/18/2022 with Dr. Clelia Croft. In the interim since the last visit she has been at her baseline level of health.  On exam today Ms. Delio reports that she has had no changes in the interim since her last visit.  She reports that she continues taking Xarelto 20 mg p.o. daily and it tends to cost her about $10 per month.  She reports that she also has an IUD in place and does have some spotting but no heavy full cycle bleeds.  She reports that she has not been have any gum bleeding, nosebleeding, or blood in the urine/stool.  She reports that she does have chronic tiredness but unfortunately she has a lot going on including working 3 jobs, being a Consulting civil engineer, taking care of children and a disabled husband.  She reports she does not currently have any signs or symptoms concerning for recurrent VTE such as leg swelling, leg pain, chest pain, or shortness of breath.  She otherwise denies any fevers, chills, sweats, nausea, vomiting or diarrhea.  Full 10 point ROS is otherwise negative.  MEDICAL HISTORY:  Past Medical History:  Diagnosis Date   Blood dyscrasia    factor 5   DVT (deep vein thrombosis) in pregnancy    Factor V Leiden (HCC)    Headache(784.0)    Infection    UTI   PCOS (polycystic ovarian syndrome)    PONV (postoperative nausea and vomiting)    Pulmonary embolism (HCC)  06/08/2011   Varicose veins     SURGICAL HISTORY: Past Surgical History:  Procedure Laterality Date   BIOPSY  06/27/2022   Procedure: BIOPSY;  Surgeon: Vida Rigger, MD;  Location: [facility];  Service: Gastroenterology;;   bladder stem stretched     CESAREAN SECTION N/A 07/01/2013   Procedure: CESAREAN SECTION;  Surgeon: Leslie Andrea, MD;  Location: [facility];  Service: Obstetrics;  Laterality: N/A;  Primary edc 07/11/13   COLONOSCOPY WITH PROPOFOL N/A 06/27/2022   Procedure: COLONOSCOPY WITH PROPOFOL;  Surgeon: Vida Rigger, MD;  Location: [facility];  Service: Gastroenterology;  Laterality: N/A;   ESOPHAGOGASTRODUODENOSCOPY (EGD) WITH PROPOFOL N/A 06/27/2022   Procedure: ESOPHAGOGASTRODUODENOSCOPY (EGD) WITH PROPOFOL;  Surgeon: Vida Rigger, MD;  Location: [facility];  Service: Gastroenterology;  Laterality: N/A;    SOCIAL HISTORY: Social History   Socioeconomic History   Marital status: Married    Spouse name: Not on file   Number of children: Not on file   Years of education: Not on file   Highest education level: Not on file  Occupational History   Not on file  Tobacco Use   Smoking status: Never   Smokeless tobacco: Never  Substance and Sexual Activity   Alcohol use: No   Drug use: No   Sexual activity: Yes    Birth control/protection: None  Other Topics Concern   Not on file  Social History Narrative   Not on file  Social Determinants of Health   Financial Resource Strain: Not on file  Food Insecurity: Not on file  Transportation Needs: Not on file  Physical Activity: Not on file  Stress: Not on file  Social Connections: Not on file  Intimate Partner Violence: Not on file    FAMILY HISTORY: Family History  Problem Relation Age of Onset   Cancer Mother        skin   Hyperlipidemia Mother    Heart disease Father    Colon polyps Father    Miscarriages / Stillbirths Sister    Diabetes Maternal Grandmother    Cancer Maternal Grandmother         breast   Osteoporosis Maternal Grandmother    Glaucoma Maternal Grandmother    Vision loss Maternal Grandmother    Breast cancer Maternal Grandmother    Dementia Maternal Grandmother    Heart disease Maternal Grandfather    Hypertension Maternal Grandfather    Stroke Maternal Grandfather    Dementia Paternal Grandmother    Heart disease Paternal Grandfather    Colon cancer Maternal Uncle    Colon cancer Paternal Aunt     ALLERGIES:  is allergic to ciprofloxacin, neomycin-bacitracin zn-polymyx, bactrim [sulfamethoxazole-trimethoprim], fluconazole, oxycodone, amoxicillin, miconazole nitrate, and penicillins.  MEDICATIONS:  Current Outpatient Medications  Medication Sig Dispense Refill   acetaminophen (TYLENOL) 500 MG tablet Take 1,000 mg by mouth every 6 (six) hours as needed for moderate pain.     butalbital-acetaminophen-caffeine (FIORICET, ESGIC) 50-325-40 MG per tablet Take 2 tablets by mouth every 6 (six) hours as needed for headache. 30 tablet 1   cholestyramine light (PREVALITE) 4 GM/DOSE powder Take 4 g by mouth daily.     cholestyramine light (PREVALITE) 4 GM/DOSE powder 1 scoop Orally Once a day for 90 days     colestipol (COLESTID) 1 g tablet Take by mouth.     cyanocobalamin (VITAMIN B12) 1000 MCG/ML injection Inject 1,000 mcg into the muscle every 14 (fourteen) days.     famotidine (PEPCID) 20 MG tablet Take 20 mg by mouth 2 (two) times daily.     hydrocortisone (ANUSOL-HC) 2.5 % rectal cream Place 1 Application rectally 2 (two) times daily. 30 g 0   hydrocortisone (ANUSOL-HC) 25 MG suppository Place 1 suppository (25 mg total) rectally 2 (two) times daily. (Patient taking differently: Place 25 mg rectally 2 (two) times daily as needed for hemorrhoids.) 12 suppository 0   medroxyPROGESTERone (PROVERA) 5 MG tablet Take 1 tablet (5 mg total) by mouth daily. 10 tablet 0   nystatin cream (MYCOSTATIN) Apply topically.     nystatin-triamcinolone (MYCOLOG II) cream SMARTSIG:Topical  Morning-Evening     ondansetron (ZOFRAN-ODT) 4 MG disintegrating tablet Take 1 tablet (4 mg total) by mouth every 8 (eight) hours as needed for nausea or vomiting. 20 tablet 0   ondansetron (ZOFRAN-ODT) 8 MG disintegrating tablet Take 8 mg by mouth every 8 (eight) hours as needed for nausea or vomiting.     pantoprazole (PROTONIX) 40 MG tablet Take 40 mg by mouth daily.     predniSONE (STERAPRED UNI-PAK 21 TAB) 10 MG (21) TBPK tablet Take by mouth daily. As directed 21 tablet 0   promethazine (PHENERGAN) 25 MG suppository Place 25 mg rectally every 6 (six) hours as needed for nausea or vomiting.     SUMAtriptan (IMITREX) 50 MG tablet Take 50 mg by mouth every 2 (two) hours as needed for migraine.     SYRINGE-NEEDLE, DISP, 3 ML 25G X 5/8" 3 ML MISC  1,000 mcg by Misc.(Non-Drug; Combo Route) route every 30 days.     terconazole (TERAZOL 7) 0.4 % vaginal cream Place 1 applicator vaginally at bedtime. (Patient not taking: Reported on 08/23/2022) 45 g 0   Vitamin D, Ergocalciferol, (DRISDOL) 1.25 MG (50000 UT) CAPS capsule Take 50,000 Units by mouth every 7 (seven) days.     XARELTO 20 MG TABS tablet TAKE 1 TABLET BY MOUTH EVERY DAY 90 tablet 0   Current Facility-Administered Medications  Medication Dose Route Frequency Provider Last Rate Last Admin   levonorgestrel (MIRENA) 20 MCG/DAY IUD 1 each  1 each Intrauterine Once Chrzanowski, Jami B, NP        REVIEW OF SYSTEMS:   Constitutional: ( - ) fevers, ( - )  chills , ( - ) night sweats Eyes: ( - ) blurriness of vision, ( - ) double vision, ( - ) watery eyes Ears, nose, mouth, throat, and face: ( - ) mucositis, ( - ) sore throat Respiratory: ( - ) cough, ( - ) dyspnea, ( - ) wheezes Cardiovascular: ( - ) palpitation, ( - ) chest discomfort, ( - ) lower extremity swelling Gastrointestinal:  ( - ) nausea, ( - ) heartburn, ( - ) change in bowel habits Skin: ( - ) abnormal skin rashes Lymphatics: ( - ) new lymphadenopathy, ( - ) easy  bruising Neurological: ( - ) numbness, ( - ) tingling, ( - ) new weaknesses Behavioral/Psych: ( - ) mood change, ( - ) new changes  All other systems were reviewed with the patient and are negative.  PHYSICAL EXAMINATION:  Vitals:   11/17/22 1100  BP: 121/72  Pulse: (!) 54  Resp: 14  Temp: 98.4 F (36.9 C)  SpO2: 100%   Filed Weights   11/17/22 1100  Weight: 249 lb 1.6 oz (113 kg)    GENERAL: Well-appearing middle-aged Caucasian female, alert, no distress and comfortable SKIN: skin color, texture, turgor are normal, no rashes or significant lesions EYES: conjunctiva are pink and non-injected, sclera clear LUNGS: clear to auscultation and percussion with normal breathing effort HEART: regular rate & rhythm and no murmurs and no lower extremity edema Musculoskeletal: no cyanosis of digits and no clubbing  PSYCH: alert & oriented x 3, fluent speech NEURO: no focal motor/sensory deficits  LABORATORY DATA:  I have reviewed the data as listed    Latest Ref Rng & Units 11/17/2022   10:37 AM 08/18/2022    3:18 PM 06/21/2022    9:57 AM  CBC  WBC 4.0 - 10.5 K/uL 7.9  8.5  6.6   Hemoglobin 12.0 - 15.0 g/dL 16.1  09.6  04.5   Hematocrit 36.0 - 46.0 % 40.6  38.3  41.5   Platelets 150 - 400 K/uL 292  257  245        Latest Ref Rng & Units 11/17/2022   10:37 AM 05/05/2022    2:45 PM 12/28/2021   11:45 AM  CMP  Glucose 70 - 99 mg/dL 409  96  99   BUN 6 - 20 mg/dL Creatinine 0.44 - 1.00 mg/dL 8.11  9.14  7.82   Sodium 135 - 145 mmol/L 140  138  139   Potassium 3.5 - 5.1 mmol/L 3.7  3.8  3.9   Chloride 98 - 111 mmol/L 103  105  106   CO2 22 - 32 mmol/L Calcium 8.9 - 10.3 mg/dL 9.7  9.3  9.2   Total Protein 6.5 - 8.1 g/dL 7.5  7.3  6.7   Total Bilirubin 0.3 - 1.2 mg/dL 0.5  1.0  0.8   Alkaline Phos 38 - 126 U/L 120  91  86   AST 15 - 41 U/L ALT 0 - 44 U/L RADIOGRAPHIC STUDIES: US Transvaginal Non-OB  Result Date:  11/16/2022 Vaginal u/s: Anteverted uterus Normal size and shape Thin, symmetrical endometrium IUD noted centrally within the endometrial canal Both ovaries mobile, normal size with normal follicle pattern and perfusion No adnexal masses    ASSESSMENT & PLAN Tanishia A Rennis Harding 37 y.o. 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  # Iron Deficiency Anemia 2/2 to GYN Bleeding -- Findings are consistent with iron deficiency anemia secondary to patient's menstrual cycles which are now under better control. --Patient currently has an IUD in place. --Today we will repeat iron panel and ferritin as well as reticulocytes, CBC, and CMP -- Patient not currently taking any iron supplementation --continue to monitor.  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  She was on full dose anticoagulation with Lovenox after C-section on 07/02/2023 -- Patient was started on Xarelto therapy after she developed a superficial thrombophlebitis in January 2015 -- Xarelto 20 mg p.o. daily was started in June 2019 due to superficial thrombophlebitis.  She has consistently been on this medication since December 2020 Plan: -- Continue Xarelto 20 mg p.o. daily. -- Patient is not having any financial toxicity with the medication and does not have any signs or symptoms concerning for recurrent VTE.  Additionally patient not having any bleeding -- Labs today show white blood cell count 7.9, hemoglobin 13.9, MCV 88.1, and platelets of 292.  Iron level showed ferritin of 78 with iron sat of 28%.  Liver and kidney function are both within normal limits -- Plan to have patient return to clinic in 3 months for labs in 6 months for clinic visit  No orders of the defined types were placed in this encounter.   All questions were answered. The patient knows to call  the clinic with any problems, questions or concerns.  A total of more than 40 minutes were spent on this encounter with face-to-face time and non-face-to-face time, including preparing to see the patient, ordering tests and/or medications, counseling the patient and coordination of care as outlined above.   Ulysees Barns, MD Department of Hematology/Oncology [facility] at [facility] Phone: [phone] Pager: [phone] Email: [email or web address]  11/18/2022 5:32 PM

## 2022-11-17 NOTE — Telephone Encounter (Signed)
Reached out to patient to schedule per 4/12 LOS, patient aware of date and times of appointment.

## 2022-11-18 ENCOUNTER — Encounter: Payer: Self-pay | Admitting: Hematology and Oncology

## 2022-12-18 ENCOUNTER — Ambulatory Visit
Admission: RE | Admit: 2022-12-18 | Discharge: 2022-12-18 | Disposition: A | Discharge status: Home / Self Care | Payer: Commercial Managed Care - HMO | Source: Ambulatory Visit | Attending: Nurse Practitioner | Admitting: Nurse Practitioner

## 2022-12-18 ENCOUNTER — Ambulatory Visit (INDEPENDENT_AMBULATORY_CARE_PROVIDER_SITE_OTHER): Payer: Commercial Managed Care - HMO

## 2022-12-18 VITALS — BP 114/77 | HR 75 | Temp 98.5°F | Resp 18

## 2022-12-18 DIAGNOSIS — R051 Acute cough: Secondary | ICD-10-CM

## 2022-12-18 DIAGNOSIS — B349 Viral infection, unspecified: Secondary | ICD-10-CM | POA: Diagnosis not present

## 2022-12-18 DIAGNOSIS — J029 Acute pharyngitis, unspecified: Secondary | ICD-10-CM | POA: Insufficient documentation

## 2022-12-18 LAB — POCT RAPID STREP A (OFFICE): Rapid Strep A Screen: NEGATIVE

## 2022-12-18 LAB — POCT MONO SCREEN (KUC): Mono, POC: NEGATIVE

## 2022-12-18 MED ORDER — BENZONATATE 200 MG PO CAPS
200.0000 mg | ORAL_CAPSULE | Freq: Three times a day (TID) | ORAL | 0 refills | Status: DC | PRN
Start: 2022-12-18 — End: 2023-02-21

## 2022-12-18 NOTE — ED Provider Notes (Signed)
[facility]    CSN: [id number] Arrival date & time: 12/18/22  0903      History   Chief Complaint Chief Complaint  Patient presents with   Sore Throat    Entered by patient    HPI Robin Key is a 37 y.o. female  presents for evaluation of URI symptoms for 2 weeks. Patient reports associated symptoms of sore throat with cough, ear pain, postnasal drip. Denies N/V/D, fevers, body aches, shortness of breath. Patient does not have a hx of asthma or smoking.  Daughter has similar symptoms.  Patient was seen by her PCP on 5/2 for 3 to 4 days of sore throat with congestion.  She had been on Keflex after doing a virtual visit at the time of this evaluation.  She was advised to stop that and was placed on a double dose of Z-Pak as well as a Medrol Dosepak and an inhaler.  Patient did not have a strep test in the office.  Pt has taken allergy medicine OTC for symptoms. Pt has no other concerns at this time.    Sore Throat    Past Medical History:  Diagnosis Date   Blood dyscrasia    factor 5   DVT (deep vein thrombosis) in pregnancy    Factor V Leiden (HCC)    Headache(784.0)    Infection    UTI   PCOS (polycystic ovarian syndrome)    PONV (postoperative nausea and vomiting)    Pulmonary embolism (HCC) 06/08/2011   Varicose veins     Patient Active Problem List   Diagnosis Date Noted   Cholecystitis, chronic 10/18/2015   Cholecystitis 10/15/2015   Leg pain 10/15/2014   Factor V Leiden, prothrombin gene mutation (HCC) 10/15/2014   Varicose veins of lower extremities with other complications 10/08/2013   Pain and swelling of lower extremity 10/08/2013   Anemia 09/08/2013   Personal history of venous thrombosis and embolism 09/08/2013   Cesarean delivery delivered 07/01/2013    Past Surgical History:  Procedure Laterality Date   BIOPSY  06/27/2022   Procedure: BIOPSY;  Surgeon: Vida Rigger, MD;  Location: [facility];  Service: Gastroenterology;;   bladder  stem stretched     CESAREAN SECTION N/A 07/01/2013   Procedure: CESAREAN SECTION;  Surgeon: Leslie Andrea, MD;  Location: [facility];  Service: Obstetrics;  Laterality: N/A;  Primary edc 07/11/13   COLONOSCOPY WITH PROPOFOL N/A 06/27/2022   Procedure: COLONOSCOPY WITH PROPOFOL;  Surgeon: Vida Rigger, MD;  Location: [facility];  Service: Gastroenterology;  Laterality: N/A;   ESOPHAGOGASTRODUODENOSCOPY (EGD) WITH PROPOFOL N/A 06/27/2022   Procedure: ESOPHAGOGASTRODUODENOSCOPY (EGD) WITH PROPOFOL;  Surgeon: Vida Rigger, MD;  Location: [facility];  Service: Gastroenterology;  Laterality: N/A;    OB History     Gravida  1   Para  1   Term  1   Preterm  0   AB  0   Living  1      SAB  0   IAB  0   Ectopic  0   Multiple  0   Live Births  1            Home Medications    Prior to Admission medications   Medication Sig Start Date End Date Taking? Authorizing Provider  benzonatate (TESSALON) 200 MG capsule Take 1 capsule (200 mg total) by mouth 3 (three) times daily as needed for cough. 12/18/22  Yes Radford Pax, NP  fluticasone (FLONASE) 50 MCG/ACT  nasal spray Place into both nostrils daily.   Yes [provider]  acetaminophen (TYLENOL) 500 MG tablet Take 1,000 mg by mouth every 6 (six) hours as needed for moderate pain.    [provider]  butalbital-acetaminophen-caffeine (FIORICET, ESGIC) 50-325-40 MG per tablet Take 2 tablets by mouth every 6 (six) hours as needed for headache. 11/06/13   Benjiman Core, MD  cholestyramine light (PREVALITE) 4 GM/DOSE powder Take 4 g by mouth daily. 05/31/22   [provider]  cholestyramine light (PREVALITE) 4 GM/DOSE powder 1 scoop Orally Once a day for 90 days    [provider]  colestipol (COLESTID) 1 g tablet Take by mouth. 07/19/22   [provider]  cyanocobalamin (VITAMIN B12) 1000 MCG/ML injection Inject 1,000 mcg into the muscle every 14 (fourteen) days. 03/09/22   [provider]  famotidine (PEPCID) 20 MG tablet Take 20 mg by mouth 2 (two) times daily.    [provider]  hydrocortisone (ANUSOL-HC) 2.5 % rectal cream Place 1 Application rectally 2 (two) times daily. 06/08/22   Unk Lightning, PA  hydrocortisone (ANUSOL-HC) 25 MG suppository Place 1 suppository (25 mg total) rectally 2 (two) times daily. Patient taking differently: Place 25 mg rectally 2 (two) times daily as needed for hemorrhoids. 06/08/22   Unk Lightning, PA  medroxyPROGESTERone (PROVERA) 5 MG tablet Take 1 tablet (5 mg total) by mouth daily. 09/13/22   Olivia Mackie, NP  nystatin cream (MYCOSTATIN) Apply topically. 08/02/22   [provider]  nystatin-triamcinolone (MYCOLOG II) cream SMARTSIG:Topical Morning-Evening 08/02/22   [provider]  ondansetron (ZOFRAN-ODT) 4 MG disintegrating tablet Take 1 tablet (4 mg total) by mouth every 8 (eight) hours as needed for nausea or vomiting. 05/05/22   Rancour, Jeannett Senior, MD  ondansetron (ZOFRAN-ODT) 8 MG disintegrating tablet Take 8 mg by mouth every 8 (eight) hours as needed for nausea or vomiting.    [provider]  pantoprazole (PROTONIX) 40 MG tablet Take 40 mg by mouth daily. 11/13/18   [provider]  predniSONE (STERAPRED UNI-PAK 21 TAB) 10 MG (21) TBPK tablet Take by mouth daily. As directed 07/07/22   Mickie Bail, NP  promethazine (PHENERGAN) 25 MG suppository Place 25 mg rectally every 6 (six) hours as needed for nausea or vomiting.    [provider]  SUMAtriptan (IMITREX) 50 MG tablet Take 50 mg by mouth every 2 (two) hours as needed for migraine. 10/25/18   [provider]  SYRINGE-NEEDLE, DISP, 3 ML 25G X 5/8" 3 ML MISC 1,000 mcg by Misc.(Non-Drug; Combo Route) route every 30 days. 12/17/18   [provider]  terconazole (TERAZOL 7) 0.4 % vaginal cream Place 1 applicator vaginally at bedtime. Patient not taking: Reported on 08/23/2022 08/16/22    Chrzanowski, Clearnce Hasten B, NP  Vitamin D, Ergocalciferol, (DRISDOL) 1.25 MG (50000 UT) CAPS capsule Take 50,000 Units by mouth every 7 (seven) days. 12/12/18   [provider]  XARELTO 20 MG TABS tablet TAKE 1 TABLET BY MOUTH EVERY DAY 06/07/22   Benjiman Core, MD    Family History Family History  Problem Relation Age of Onset   Cancer Mother        skin   Hyperlipidemia Mother    Heart disease Father    Colon polyps Father    Miscarriages / Stillbirths Sister    Diabetes Maternal Grandmother    Cancer Maternal Grandmother        breast   Osteoporosis  Maternal Grandmother    Glaucoma Maternal Grandmother    Vision loss Maternal Grandmother    Breast cancer Maternal Grandmother    Dementia Maternal Grandmother    Heart disease Maternal Grandfather    Hypertension Maternal Grandfather    Stroke Maternal Grandfather    Dementia Paternal Grandmother    Heart disease Paternal Grandfather    Colon cancer Maternal Uncle    Colon cancer Paternal Aunt     Social History Social History   Tobacco Use   Smoking status: Never   Smokeless tobacco: Never  Substance Use Topics   Alcohol use: No   Drug use: No     Allergies   Ciprofloxacin, Neomycin-bacitracin zn-polymyx, Bactrim [sulfamethoxazole-trimethoprim], Fluconazole, Oxycodone, Amoxicillin, Miconazole nitrate, and Penicillins   Review of Systems Review of Systems  HENT:  Positive for ear pain, postnasal drip and sore throat.   Respiratory:  Positive for cough.      Physical Exam Triage Vital Signs ED Triage Vitals  Enc Vitals Group     BP 12/18/22 0915 114/77     Pulse Rate 12/18/22 0915 75     Resp 12/18/22 0915 18     Temp 12/18/22 0915 98.5 F (36.9 C)     Temp Source 12/18/22 0915 Oral     SpO2 12/18/22 0915 98 %     Weight --      Height --      Head Circumference --      Peak Flow --      Pain Score 12/18/22 0919 3     Pain Loc --      Pain Edu? --      Excl. in GC? --    No data  found.  Updated Vital Signs BP 114/77 (BP Location: Left Arm)   Pulse 75   Temp 98.5 F (36.9 C) (Oral)   Resp 18   LMP 12/14/2022 (Exact Date)   SpO2 98%   Visual Acuity Right Eye Distance:   Left Eye Distance:   Bilateral Distance:    Right Eye Near:   Left Eye Near:    Bilateral Near:     Physical Exam Vitals and nursing note reviewed.  Constitutional:      General: She is not in acute distress.    Appearance: She is well-developed. She is not ill-appearing.  HENT:     Head: Normocephalic and atraumatic.     Right Ear: Ear canal normal. A middle ear effusion is present. Tympanic membrane is not erythematous.     Left Ear: Ear canal normal. A middle ear effusion is present. Tympanic membrane is not erythematous.     Nose: Congestion present.     Mouth/Throat:     Mouth: Mucous membranes are moist.     Pharynx: Oropharynx is clear. Uvula midline. No oropharyngeal exudate or posterior oropharyngeal erythema.     Tonsils: No tonsillar exudate or tonsillar abscesses.  Eyes:     Conjunctiva/sclera: Conjunctivae normal.     Pupils: Pupils are equal, round, and reactive to light.  Cardiovascular:     Rate and Rhythm: Normal rate and regular rhythm.     Heart sounds: Normal heart sounds.  Pulmonary:     Effort: Pulmonary effort is normal.     Breath sounds: Normal breath sounds.  Musculoskeletal:     Cervical back: Normal range of motion and neck supple.  Lymphadenopathy:     Cervical: No cervical adenopathy.  Skin:    General: Skin is warm and  dry.  Neurological:     General: No focal deficit present.     Mental Status: She is alert and oriented to person, place, and time.  Psychiatric:        Mood and Affect: Mood normal.        Behavior: Behavior normal.      UC Treatments / Results  Labs (all labs ordered are listed, but only abnormal results are displayed) Labs Reviewed  CULTURE, GROUP A STREP [facility])  POCT RAPID STREP A (OFFICE)  POCT MONO SCREEN [facility])     EKG   Radiology DG Chest 2 View  Result Date: 12/18/2022 CLINICAL DATA:  Cough for 2 weeks. EXAM: CHEST - 2 VIEW COMPARISON:  None Available. FINDINGS: The heart size and mediastinal contours are within normal limits. Both lungs are clear. The visualized skeletal structures are unremarkable. IMPRESSION: No active cardiopulmonary disease. Electronically Signed   By: Larose Hires D.O.   On: 12/18/2022 10:02    Procedures Procedures (including critical care time)  Medications Ordered in UC Medications - No data to display  Initial Impression / Assessment and Plan / UC Course  I have reviewed the triage vital signs and the nursing notes.  Pertinent labs & imaging results that were available during my care of the patient were reviewed by me and considered in my medical decision making (see chart for details).     Negative rapid strep, will culture. Pt declines COVID testing  Negative Mono and CXR unremarkable  Discussed there is no indication for additional antibiotics and that symptom management best course at this time Tessalon PRN Continue Flonase and allergy medication  PCP follow up if symptoms do not improve ER precautions reviewed and pt verbalized understanding  Final Clinical Impressions(s) / UC Diagnoses   Final diagnoses:  Sore throat  Acute cough  Viral illness     Discharge Instructions      Your strep test and chest x-ray as well as monotest was negative in the clinic today.  We will send your strep throat swab for a culture and contact you if those are positive Tessalon as needed for cough Please continue your allergy medicine and Flonase Please follow up with your PCP if your symptoms do not improve Please go to the ER for any worsening symptoms  I hope you feel better soon!     ED Prescriptions     Medication Sig Dispense Auth. Provider   benzonatate (TESSALON) 200 MG capsule Take 1 capsule (200 mg total) by mouth 3 (three) times daily as needed  for cough. 20 capsule Radford Pax, NP      PDMP not reviewed this encounter.   Radford Pax, NP 12/18/22 1015

## 2022-12-18 NOTE — Discharge Instructions (Addendum)
Your strep test and chest x-ray as well as monotest was negative in the clinic today.  We will send your strep throat swab for a culture and contact you if those are positive Tessalon as needed for cough Please continue your allergy medicine and Flonase Please follow up with your PCP if your symptoms do not improve Please go to the ER for any worsening symptoms  I hope you feel better soon!

## 2022-12-18 NOTE — ED Triage Notes (Signed)
Pt reports sore throat x 2 weeks. Pt was treated for Strep.

## 2022-12-20 LAB — CULTURE, GROUP A STREP (THRC)

## 2022-12-21 LAB — CULTURE, GROUP A STREP (THRC)

## 2023-02-07 ENCOUNTER — Ambulatory Visit: Payer: Commercial Managed Care - HMO | Admitting: Radiology

## 2023-02-15 ENCOUNTER — Other Ambulatory Visit: Payer: Self-pay | Admitting: Hematology and Oncology

## 2023-02-15 DIAGNOSIS — D6851 Activated protein C resistance: Secondary | ICD-10-CM

## 2023-02-15 DIAGNOSIS — D500 Iron deficiency anemia secondary to blood loss (chronic): Secondary | ICD-10-CM

## 2023-02-16 ENCOUNTER — Inpatient Hospital Stay: Payer: Commercial Managed Care - HMO | Attending: Oncology

## 2023-02-19 ENCOUNTER — Telehealth: Payer: Self-pay

## 2023-02-19 NOTE — Telephone Encounter (Signed)
Pt called in stating that she feels that her IUD might be misplaced. States she feels as if her strings are out into the vagina longer than normal and can almost feel a bump/bulge on side of cervix. Has hx of IUD expulsion.  Denies any significant pain/heavy vaginal bleeding.   Reports intermittent menstrual like cramping/spotting.   Pt scheduled to see JC on 02/21/2023.   Pt advised that she is good for that appt unless anything changes with her pain/bleeding. Precautions reviewed and pt voiced understanding. Will route to provider for review and close.

## 2023-02-21 ENCOUNTER — Ambulatory Visit: Payer: Commercial Managed Care - HMO | Admitting: Radiology

## 2023-02-21 ENCOUNTER — Encounter: Payer: Self-pay | Admitting: Radiology

## 2023-02-21 VITALS — BP 126/84 | HR 60

## 2023-02-21 DIAGNOSIS — Z30431 Encounter for routine checking of intrauterine contraceptive device: Secondary | ICD-10-CM | POA: Diagnosis not present

## 2023-02-21 NOTE — Progress Notes (Signed)
     History:  37 y.o. G1P1001 here today for today for IUD string check; Mirena IUD was placed  08/23/22. Feels strings are longer and feels a 'bump' on her cervix. Has had misplaced/expelled IUD before   Review of Systems:  Pertinent items are noted in HPI.   Objective:  Physical Exam Blood pressure 126/84, pulse 60, SpO2 100%. Gen: NAD Abd: Soft, nontender and nondistended Pelvic: Normal appearing external genitalia; normal appearing vaginal mucosa and cervix.  IUD strings visualized, about 3 cm in length outside cervix.   Kristin Bruins, CMA present for exam  Assessment & Plan:  1. IUD check up Pt requests u/s to confirm placement, will schedule - US PELVIC COMPLETE WITH TRANSVAGINAL; Future   Arlie Solomons, [facility]

## 2023-03-30 ENCOUNTER — Other Ambulatory Visit (HOSPITAL_COMMUNITY)
Admission: RE | Admit: 2023-03-30 | Discharge: 2023-03-30 | Disposition: A | Discharge status: Home / Self Care | Payer: Commercial Managed Care - HMO | Source: Ambulatory Visit | Attending: Radiology | Admitting: Radiology

## 2023-03-30 ENCOUNTER — Encounter: Payer: Self-pay | Admitting: Radiology

## 2023-03-30 ENCOUNTER — Ambulatory Visit: Payer: Commercial Managed Care - HMO | Admitting: Radiology

## 2023-03-30 VITALS — BP 100/60 | Ht 67.25 in | Wt 255.0 lb

## 2023-03-30 DIAGNOSIS — Z113 Encounter for screening for infections with a predominantly sexual mode of transmission: Secondary | ICD-10-CM

## 2023-03-30 DIAGNOSIS — Z01419 Encounter for gynecological examination (general) (routine) without abnormal findings: Secondary | ICD-10-CM | POA: Diagnosis present

## 2023-03-30 DIAGNOSIS — Z975 Presence of (intrauterine) contraceptive device: Secondary | ICD-10-CM | POA: Diagnosis not present

## 2023-03-30 NOTE — Progress Notes (Signed)
   Robin Key 1985-11-21 [id number]   History:  37 y.o. G1P1 presents for annual exam.No gyn concerns. Would like STI screening including a throat swab. No symptoms.  Gynecologic History No LMP recorded. (Menstrual status: IUD).   Contraception/Family planning: IUD Sexually active: yes Last Pap: 2016. Results were: normal   Obstetric History OB History  Gravida Para Term Preterm AB Living  1 1 1  0 0 1  SAB IAB Ectopic Multiple Live Births  0 0 0 0 1    # Outcome Date GA Lbr Len/2nd Weight Sex Type Anes PTL Lv  1 Term 07/01/13 [redacted]w[redacted]d  8 lb 4.5 oz (3.755 kg) F CS-LTranv Spinal  LIV     The following portions of the patient's history were reviewed and updated as appropriate: allergies, current medications, past family history, past medical history, past social history, past surgical history, and problem list.  Review of Systems Pertinent items noted in HPI and remainder of comprehensive ROS otherwise negative.   Past medical history, past surgical history, family history and social history were all reviewed and documented in the EPIC chart.   Exam:  Vitals:   03/30/23 1333  BP: 100/60  Weight: 255 lb (115.7 kg)  Height: 5' 7.25" (1.708 m)   Body mass index is 39.64 kg/m.  General appearance:  Normal Thyroid:  Symmetrical, normal in size, without palpable masses or nodularity. Respiratory  Auscultation:  Clear without wheezing or rhonchi Cardiovascular  Auscultation:  Regular rate, without rubs, murmurs or gallops  Edema/varicosities:  Not grossly evident Abdominal  Soft,nontender, without masses, guarding or rebound.  Liver/spleen:  No organomegaly noted  Hernia:  None appreciated  Skin  Inspection:  Grossly normal Breasts: Examined lying and sitting.   Right: Without masses, retractions, nipple discharge or axillary adenopathy.   Left: Without masses, retractions, nipple discharge or axillary adenopathy. Genitourinary   Inguinal/mons:  Normal without  inguinal adenopathy  External genitalia:  Normal appearing vulva with no masses, tenderness, or lesions  BUS/Urethra/Skene's glands:  Normal without masses or exudate  Vagina:  Normal appearing with normal color and discharge, no lesions  Cervix:  Normal appearing without discharge or lesions  Uterus:  Normal in size, shape and contour.  Mobile, nontender  Adnexa/parametria:     Rt: Normal in size, without masses or tenderness.   Lt: Normal in size, without masses or tenderness.  Anus and perineum: Normal   [name], CMA present for exam  Assessment/Plan:   1. Well female exam with routine gynecological exam - Cytology - PAP( [place])  2. Screening for STDs (sexually transmitted diseases) - Cytology - PAP( [place]) - SURESWAB CT/NG/T. Vaginalis (throat sureswab)  3. IUD (intrauterine device) in place Mirena placed 08/2022     Discussed SBE, colonoscopy and DEXA screening as directed/appropriate. Recommend of exercise weekly, including weight bearing exercise. Encouraged the use of seatbelts and sunscreen. Return in 1 year for annual or as needed.   Arlie Solomons B WHNP-BC 1:48 PM 03/30/2023

## 2023-03-31 LAB — SURESWAB CT/NG/T. VAGINALIS
C. trachomatis RNA, TMA: NOT DETECTED
N. gonorrhoeae RNA, TMA: NOT DETECTED
Trichomonas vaginalis RNA: NOT DETECTED

## 2023-03-31 LAB — HEPATITIS C ANTIBODY: Hepatitis C Ab: NONREACTIVE

## 2023-03-31 LAB — RPR: RPR Ser Ql: NONREACTIVE

## 2023-03-31 LAB — HEPATITIS B SURFACE ANTIGEN: Hepatitis B Surface Ag: NONREACTIVE

## 2023-03-31 LAB — HIV ANTIBODY (ROUTINE TESTING W REFLEX): HIV 1&2 Ab, 4th Generation: NONREACTIVE

## 2023-04-04 ENCOUNTER — Other Ambulatory Visit: Payer: Self-pay

## 2023-04-04 DIAGNOSIS — R87612 Low grade squamous intraepithelial lesion on cytologic smear of cervix (LGSIL): Secondary | ICD-10-CM

## 2023-04-04 LAB — CYTOLOGY - PAP
Chlamydia: NEGATIVE
Comment: NEGATIVE
Comment: NEGATIVE
Comment: NEGATIVE
Comment: NEGATIVE
Comment: NEGATIVE
Comment: NORMAL
HPV 16: NEGATIVE
HPV 18 / 45: NEGATIVE
High risk HPV: POSITIVE — AB
Neisseria Gonorrhea: NEGATIVE
Trichomonas: NEGATIVE

## 2023-04-11 ENCOUNTER — Other Ambulatory Visit (HOSPITAL_COMMUNITY)
Admission: RE | Admit: 2023-04-11 | Discharge: 2023-04-11 | Disposition: A | Discharge status: Home / Self Care | Payer: Commercial Managed Care - HMO | Source: Ambulatory Visit | Attending: Obstetrics and Gynecology | Admitting: Obstetrics and Gynecology

## 2023-04-11 ENCOUNTER — Encounter: Payer: Self-pay | Admitting: Obstetrics and Gynecology

## 2023-04-11 ENCOUNTER — Ambulatory Visit (INDEPENDENT_AMBULATORY_CARE_PROVIDER_SITE_OTHER): Payer: Commercial Managed Care - HMO | Admitting: Obstetrics and Gynecology

## 2023-04-11 VITALS — BP 110/64 | HR 76 | Resp 16

## 2023-04-11 DIAGNOSIS — Z01812 Encounter for preprocedural laboratory examination: Secondary | ICD-10-CM | POA: Diagnosis present

## 2023-04-11 DIAGNOSIS — R87612 Low grade squamous intraepithelial lesion on cytologic smear of cervix (LGSIL): Secondary | ICD-10-CM | POA: Diagnosis present

## 2023-04-11 LAB — PREGNANCY, URINE: Preg Test, Ur: NEGATIVE

## 2023-04-11 NOTE — Progress Notes (Signed)
Colposcopy Procedure Note SONNA VANDERGRIFF 04/11/2023  Indications:  Procedure Details  The risks and benefits of the procedure and Verbal informed consent obtained.  Speculum placed in vagina and excellent visualization of cervix achieved, cervix swabbed x 3 with acetic acid solution.  Impression:CIN1  Satisfactory( ECC zone seen): yes  Findings:  Cervix colposcopy biopsy taken: 92 O'clock for acetowhite changes   ECC performed with cytobrush IUD STRINGS SEEN  Hemostasis obtained with application of Monsel's Solution    Complications:    Patient tolerated the procedure well  Plan:  To notify patient in epic and phone call of results and plan of care Discussed she must have two consecutive normal pap smears Q48months before returning to annual pap smears Counseled on the importance of gardesil vaccination.  She reports she completed these   Earley Favor

## 2023-04-13 LAB — SURGICAL PATHOLOGY

## 2023-04-23 ENCOUNTER — Telehealth: Payer: Self-pay | Admitting: Hematology and Oncology

## 2023-05-03 ENCOUNTER — Encounter: Payer: Self-pay | Admitting: Hematology and Oncology

## 2023-05-10 ENCOUNTER — Other Ambulatory Visit: Payer: Self-pay | Admitting: Hematology and Oncology

## 2023-05-10 ENCOUNTER — Encounter: Payer: Self-pay | Admitting: Hematology and Oncology

## 2023-05-10 ENCOUNTER — Inpatient Hospital Stay: Payer: Managed Care, Other (non HMO) | Admitting: Hematology and Oncology

## 2023-05-10 ENCOUNTER — Inpatient Hospital Stay: Payer: Managed Care, Other (non HMO) | Attending: Oncology

## 2023-05-10 DIAGNOSIS — J0111 Acute recurrent frontal sinusitis: Secondary | ICD-10-CM

## 2023-05-10 DIAGNOSIS — D500 Iron deficiency anemia secondary to blood loss (chronic): Secondary | ICD-10-CM | POA: Insufficient documentation

## 2023-05-10 DIAGNOSIS — N920 Excessive and frequent menstruation with regular cycle: Secondary | ICD-10-CM | POA: Diagnosis present

## 2023-05-10 DIAGNOSIS — Z7901 Long term (current) use of anticoagulants: Secondary | ICD-10-CM | POA: Insufficient documentation

## 2023-05-10 DIAGNOSIS — D6851 Activated protein C resistance: Secondary | ICD-10-CM

## 2023-05-10 DIAGNOSIS — J329 Chronic sinusitis, unspecified: Secondary | ICD-10-CM | POA: Diagnosis not present

## 2023-05-10 LAB — CMP (CANCER CENTER ONLY)
ALT: 12 U/L (ref 0–44)
AST: 12 U/L — ABNORMAL LOW (ref 15–41)
Albumin: 4.3 g/dL (ref 3.5–5.0)
Alkaline Phosphatase: 120 U/L (ref 38–126)
Anion gap: 5 (ref 5–15)
BUN: 8 mg/dL (ref 6–20)
CO2: 29 mmol/L (ref 22–32)
Calcium: 9.5 mg/dL (ref 8.9–10.3)
Chloride: 104 mmol/L (ref 98–111)
Creatinine: 0.93 mg/dL (ref 0.44–1.00)
GFR, Estimated: >60 mL/min (ref >60)
Glucose, Bld: 99 mg/dL (ref 70–99)
Potassium: 4.1 mmol/L (ref 3.5–5.1)
Sodium: 138 mmol/L (ref 135–145)
Total Bilirubin: 0.6 mg/dL (ref 0.3–1.2)
Total Protein: 7 g/dL (ref 6.5–8.1)

## 2023-05-10 LAB — IRON AND IRON BINDING CAPACITY (CC-WL,HP ONLY)
Iron: 174 ug/dL — ABNORMAL HIGH (ref 28–170)
Saturation Ratios: 62 % — ABNORMAL HIGH (ref 10.4–31.8)
TIBC: 280 ug/dL (ref 250–450)
UIBC: 106 ug/dL — ABNORMAL LOW (ref 148–442)

## 2023-05-10 LAB — CBC WITH DIFFERENTIAL (CANCER CENTER ONLY)
Abs Immature Granulocytes: 0.02 10*3/uL (ref 0.00–0.07)
Basophils Absolute: 0.1 10*3/uL (ref 0.0–0.1)
Basophils Relative: 1 %
Eosinophils Absolute: 0.3 10*3/uL (ref 0.0–0.5)
Eosinophils Relative: 5 %
HCT: 41.5 % (ref 36.0–46.0)
Hemoglobin: 13.5 g/dL (ref 12.0–15.0)
Immature Granulocytes: 0 %
Lymphocytes Relative: 17 %
Lymphs Abs: 1 10*3/uL (ref 0.7–4.0)
MCH: 29 pg (ref 26.0–34.0)
MCHC: 32.5 g/dL (ref 30.0–36.0)
MCV: 89.1 fL (ref 80.0–100.0)
Monocytes Absolute: 0.4 10*3/uL (ref 0.1–1.0)
Monocytes Relative: 6 %
Neutro Abs: 4.3 10*3/uL (ref 1.7–7.7)
Neutrophils Relative %: 71 %
Platelet Count: 278 10*3/uL (ref 150–400)
RBC: 4.66 MIL/uL (ref 3.87–5.11)
RDW: 12.4 % (ref 11.5–15.5)
WBC Count: 6.1 10*3/uL (ref 4.0–10.5)
nRBC: 0 % (ref 0.0–0.2)

## 2023-05-10 LAB — RETIC PANEL
Immature Retic Fract: 12.8 % (ref 2.3–15.9)
RBC.: 4.53 MIL/uL (ref 3.87–5.11)
Retic Count, Absolute: 41.2 10*3/uL (ref 19.0–186.0)
Retic Ct Pct: 0.9 % (ref 0.4–3.1)
Reticulocyte Hemoglobin: 32.1 pg (ref >27.9)

## 2023-05-10 LAB — FERRITIN: Ferritin: 72 ng/mL (ref 11–307)

## 2023-05-10 MED ORDER — RIVAROXABAN 20 MG PO TABS
20.0000 mg | ORAL_TABLET | Freq: Every day | ORAL | 2 refills | Status: DC
Start: 2023-05-10 — End: 2024-06-16

## 2023-05-10 MED ORDER — AZITHROMYCIN 250 MG PO TABS
500.0000 mg | ORAL_TABLET | Freq: Every day | ORAL | Status: DC
Start: 2023-05-10 — End: 2023-05-10

## 2023-05-10 MED ORDER — AZITHROMYCIN 250 MG PO TABS
250.0000 mg | ORAL_TABLET | Freq: Every day | ORAL | Status: DC
Start: 2023-05-11 — End: 2023-05-10

## 2023-05-10 NOTE — Progress Notes (Signed)
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 Clelia Croft 11/17/2022: transition care to Dr. Leonides Schanz   Interval History:  Robin Key 37 y.o. 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 The patient's last visit was on 11/17/2022. In the interim since the last visit she has been at her baseline level of health.  On exam today Robin Key reports she has been well overall in the interim since her last visit 6 months ago.  She reports she continues taking Xarelto 20 mg p.o. daily.  She reports that she has been under some stress because her daughter has health issues.  She reports that she "feels quite tired.  She notes that her menstrual cycles have not been very reliable though she has not had any worsening bleeding or bleeding elsewhere from her Xarelto.  She reports that she would occasionally take ibuprofen for her lupus.  She reports she does not have any signs or symptoms concerning for recurrent VTE such as leg swelling, chest pain, leg pain, or shortness of breath.  She reports that she does have periodic sinus infections for which she is requesting a Z-Pak.  She otherwise denies any fevers, chills, sweats, nausea, vomiting or diarrhea.  Full 10 point ROS is otherwise negative.  MEDICAL HISTORY:  Past Medical History:  Diagnosis Date   Abnormal Pap smear of cervix    Blood dyscrasia    factor 5   DVT (deep vein thrombosis) in pregnancy    Factor V Leiden (HCC)    Headache(784.0)    Infection    UTI   PCOS (polycystic ovarian syndrome)    PONV (postoperative nausea and vomiting)     Pulmonary embolism (HCC) 06/08/2011   Varicose veins     SURGICAL HISTORY: Past Surgical History:  Procedure Laterality Date   BIOPSY  06/27/2022   Procedure: BIOPSY;  Surgeon: Vida Rigger, MD;  Location: [facility];  Service: Gastroenterology;;   bladder stem stretched     CESAREAN SECTION N/A 07/01/2013   Procedure: CESAREAN SECTION;  Surgeon: Leslie Andrea, MD;  Location: [facility];  Service: Obstetrics;  Laterality: N/A;  Primary edc 07/11/13   COLONOSCOPY WITH PROPOFOL N/A 06/27/2022   Procedure: COLONOSCOPY WITH PROPOFOL;  Surgeon: Vida Rigger, MD;  Location: [facility];  Service: Gastroenterology;  Laterality: N/A;   ESOPHAGOGASTRODUODENOSCOPY (EGD) WITH PROPOFOL N/A 06/27/2022   Procedure: ESOPHAGOGASTRODUODENOSCOPY (EGD) WITH PROPOFOL;  Surgeon: Vida Rigger, MD;  Location: [facility];  Service: Gastroenterology;  Laterality: N/A;    SOCIAL HISTORY: Social History   Socioeconomic History   Marital status: Married    Spouse name: Not on file   Number of children: Not on file   Years of education: Not on file   Highest education level: Not on file  Occupational History   Not on file  Tobacco Use   Smoking status: Never    Passive exposure: Never   Smokeless tobacco: Never  Substance and Sexual Activity   Alcohol use: No   Drug use: No   Sexual activity: Yes    Partners: Male    Birth control/protection: I.U.D.    Comment: menarche  37yo, sexual debut as a teen  Other Topics Concern   Not on file  Social History Narrative   Not on file   Social Determinants of Health   Financial Resource Strain: Not on file  Food Insecurity: Low Risk  (09/20/2022)   [facility]   Hunger Vital Sign    Worried About Running Out of Food in the Last Year: Never true    Within the past 12 months, the food you bought just didn't last and you didn't have money to get more: Not on file  Transportation Needs: No Transportation Needs (09/20/2022)    [facility]   Transportation    In the past 12 months, has lack of reliable transportation kept you from medical appointments, meetings, work or from getting things needed for daily living? : No  Physical Activity: Not on file  Stress: Not on file  Social Connections: Not on file  Intimate Partner Violence: Not on file    FAMILY HISTORY: Family History  Problem Relation Age of Onset   Cancer Mother        skin   Hyperlipidemia Mother    Heart disease Father    Colon polyps Father    Miscarriages / Stillbirths Sister    Diabetes Maternal Grandmother    Cancer Maternal Grandmother        breast   Osteoporosis Maternal Grandmother    Glaucoma Maternal Grandmother    Vision loss Maternal Grandmother    Breast cancer Maternal Grandmother    Dementia Maternal Grandmother    Heart disease Maternal Grandfather    Hypertension Maternal Grandfather    Stroke Maternal Grandfather    Dementia Paternal Grandmother    Heart disease Paternal Grandfather    Colon cancer Maternal Uncle    Colon cancer Paternal Aunt     ALLERGIES:  is allergic to ciprofloxacin, neomycin-bacitracin zn-polymyx, bactrim [sulfamethoxazole-trimethoprim], fluconazole, oxycodone, amoxicillin, miconazole nitrate, and penicillins.  MEDICATIONS:  Current Outpatient Medications  Medication Sig Dispense Refill   acetaminophen (TYLENOL) 500 MG tablet Take 1,000 mg by mouth every 6 (six) hours as needed for moderate pain.     azithromycin (ZITHROMAX Z-PAK) 250 MG tablet Take 2 pills by mouth for 1st day and 1 pill by mouth every day thereafter for a total of 5 days. 6 each 0   butalbital-acetaminophen-caffeine (FIORICET, ESGIC) 50-325-40 MG per tablet Take 2 tablets by mouth every 6 (six) hours as needed for headache. 30 tablet 1   cholestyramine light (PREVALITE) 4 GM/DOSE powder Take 4 g by mouth daily.     cyanocobalamin (VITAMIN B12) 1000 MCG/ML injection Inject 1,000 mcg into the  muscle every 14 (fourteen) days.     famotidine (PEPCID) 20 MG tablet Take 20 mg by mouth 2 (two) times daily.     fluticasone (FLONASE) 50 MCG/ACT nasal spray Place into both nostrils daily.     hydrocortisone (ANUSOL-HC) 2.5 % rectal cream Place 1 Application rectally 2 (two) times daily. 30 g 0   hydrocortisone (ANUSOL-HC) 25 MG suppository Place 1 suppository (25 mg total) rectally 2 (two) times daily. (Patient taking differently: Place 25 mg rectally 2 (two) times daily as needed for hemorrhoids.) 12 suppository 0   nystatin cream (MYCOSTATIN) Apply topically.     nystatin-triamcinolone (MYCOLOG II) cream SMARTSIG:Topical Morning-Evening     ondansetron (ZOFRAN-ODT) 8 MG disintegrating tablet Take 8 mg by mouth every 8 (eight) hours as needed for nausea or vomiting.  pantoprazole (PROTONIX) 40 MG tablet Take 40 mg by mouth daily.     promethazine (PHENERGAN) 25 MG suppository Place 25 mg rectally every 6 (six) hours as needed for nausea or vomiting.     rivaroxaban (XARELTO) 20 MG TABS tablet Take 1 tablet (20 mg total) by mouth daily. 90 tablet 2   SUMAtriptan (IMITREX) 50 MG tablet Take 50 mg by mouth every 2 (two) hours as needed for migraine.     SYRINGE-NEEDLE, DISP, 3 ML 25G X 5/8" 3 ML MISC 1,000 mcg by Misc.(Non-Drug; Combo Route) route every 30 days.     Vitamin D, Ergocalciferol, (DRISDOL) 1.25 MG (50000 UT) CAPS capsule Take 50,000 Units by mouth every 7 (seven) days.     Current Facility-Administered Medications  Medication Dose Route Frequency Provider Last Rate Last Admin   levonorgestrel (MIRENA) 20 MCG/DAY IUD 1 each  1 each Intrauterine Once Chrzanowski, Jami B, NP        REVIEW OF SYSTEMS:   Constitutional: ( - ) fevers, ( - )  chills , ( - ) night sweats Eyes: ( - ) blurriness of vision, ( - ) double vision, ( - ) watery eyes Ears, nose, mouth, throat, and face: ( - ) mucositis, ( - ) sore throat Respiratory: ( - ) cough, ( - ) dyspnea, ( - )  wheezes Cardiovascular: ( - ) palpitation, ( - ) chest discomfort, ( - ) lower extremity swelling Gastrointestinal:  ( - ) nausea, ( - ) heartburn, ( - ) change in bowel habits Skin: ( - ) abnormal skin rashes Lymphatics: ( - ) new lymphadenopathy, ( - ) easy bruising Neurological: ( - ) numbness, ( - ) tingling, ( - ) new weaknesses Behavioral/Psych: ( - ) mood change, ( - ) new changes  All other systems were reviewed with the patient and are negative.  PHYSICAL EXAMINATION:  Vitals:   05/10/23 1051  BP: 110/79  Pulse: (!) 58  Resp: 16  Temp: 97.6 F (36.4 C)  SpO2: 100%    Filed Weights   05/10/23 1051  Weight: 249 lb 3.2 oz (113 kg)    GENERAL: Well-appearing middle-aged Caucasian female, alert, no distress and comfortable SKIN: skin color, texture, turgor are normal, no rashes or significant lesions EYES: conjunctiva are pink and non-injected, sclera clear LUNGS: clear to auscultation and percussion with normal breathing effort HEART: regular rate & rhythm and no murmurs and no lower extremity edema Musculoskeletal: no cyanosis of digits and no clubbing  PSYCH: alert & oriented x 3, fluent speech NEURO: no focal motor/sensory deficits  LABORATORY DATA:  I have reviewed the data as listed    Latest Ref Rng & Units 05/10/2023   10:26 AM 11/17/2022   10:37 AM 08/18/2022    3:18 PM  CBC  WBC 4.0 - 10.5 K/uL 6.1  7.9  8.5   Hemoglobin 12.0 - 15.0 g/dL 57.8  46.9  62.9   Hematocrit 36.0 - 46.0 % 41.5  40.6  38.3   Platelets 150 - 400 K/uL 278  292  257        Latest Ref Rng & Units 05/10/2023   10:26 AM 11/17/2022   10:37 AM 05/05/2022    2:45 PM  CMP  Glucose 70 - 99 mg/dL 99  528  96   BUN 6 - 20 mg/dL 8  6  9    Creatinine 0.44 - 1.00 mg/dL 4.13  2.44  0.10   Sodium 135 - 145 mmol/L 138  140  138   Potassium 3.5 - 5.1 mmol/L 4.1  3.7  3.8   Chloride 98 - 111 mmol/L 104  103  105   CO2 22 - 32 mmol/L 29  30  25    Calcium 8.9 - 10.3 mg/dL 9.5  9.7  9.3   Total  Protein 6.5 - 8.1 g/dL 7.0  7.5  7.3   Total Bilirubin 0.3 - 1.2 mg/dL 0.6  0.5  1.0   Alkaline Phos 38 - 126 U/L 120  120  91   AST 15 - 41 U/L 12  15  14    ALT 0 - 44 U/L 12  20  19      RADIOGRAPHIC STUDIES: No results found.  ASSESSMENT & PLAN Robin Key 37 y.o. 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  # Iron Deficiency Anemia 2/2 to GYN Bleeding -- Findings are consistent with iron deficiency anemia secondary to patient's menstrual cycles which are now under better control. --Patient currently has an IUD in place. --Today we will repeat iron panel and ferritin as well as reticulocytes, CBC, and CMP -- Patient not currently taking any iron supplementation --continue to monitor.  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  She was on full dose anticoagulation with Lovenox after C-section on 07/02/2023 -- Patient was started on Xarelto therapy after she developed a superficial thrombophlebitis in January 2015 -- Xarelto 20 mg p.o. daily was started in June 2019 due to superficial thrombophlebitis.  She has consistently been on this medication since December 2020 Plan: -- Continue Xarelto 20 mg p.o. daily. -- Patient is not having any financial toxicity with the medication and does not have any signs or symptoms concerning for recurrent VTE.  Additionally patient not having any bleeding -- Labs today show white blood cell count 6.1, hemoglobin 13.5, MCV 89, and platelets of 278.  Liver and kidney function are both within normal limits -- Plan to have patient return to clinic in 6 months  # Sinus Infection -- Will prescribe patient Z-Pak per her request. -- Patient has dark green mucus discharge but no evidence of systemic symptoms.  No orders of the defined types were placed in this  encounter.   All questions were answered. The patient knows to call the clinic with any problems, questions or concerns.  A total of more than 30 minutes were spent on this encounter with face-to-face time and non-face-to-face time, including preparing to see the patient, ordering tests and/or medications, counseling the patient and coordination of care as outlined above.   Ulysees Barns, MD Department of Hematology/Oncology [facility] Phone: [phone] Pager: [phone] Email: [email or web address]  05/21/2023 7:20 PM

## 2023-05-11 ENCOUNTER — Other Ambulatory Visit: Payer: Self-pay | Admitting: Hematology and Oncology

## 2023-05-11 MED ORDER — AZITHROMYCIN 250 MG PO TABS: ORAL_TABLET | ORAL | 0 refills | Status: DC

## 2023-05-18 ENCOUNTER — Other Ambulatory Visit: Payer: Commercial Managed Care - HMO

## 2023-05-18 ENCOUNTER — Ambulatory Visit: Payer: Commercial Managed Care - HMO | Admitting: Hematology and Oncology

## 2023-05-21 ENCOUNTER — Encounter: Payer: Self-pay | Admitting: Hematology and Oncology

## 2023-05-22 ENCOUNTER — Telehealth: Payer: Self-pay

## 2023-05-22 NOTE — Telephone Encounter (Signed)
This RN called pt to relay iron results below. Pt verbalized understanding.

## 2023-05-22 NOTE — Telephone Encounter (Signed)
-----   Message from Ulysees Barns IV sent at 05/21/2023  7:20 PM EDT ----- Please let Ms. Laursen know that her iron levels are all within normal limits.  She does not require any further IV iron therapy at this time.  We will plan to see her back in 6 months time to reevaluate how she is doing on blood thinners and to assure that her iron levels remain within normal limits. ----- Message ----- From: Leory Plowman, Lab In [place] Sent: 05/10/2023  10:34 AM EDT To: Jaci Standard, MD

## 2023-09-11 ENCOUNTER — Other Ambulatory Visit: Payer: Self-pay

## 2023-09-11 ENCOUNTER — Telehealth: Payer: Self-pay

## 2023-09-11 ENCOUNTER — Encounter: Payer: Self-pay | Admitting: Hematology and Oncology

## 2023-09-11 ENCOUNTER — Inpatient Hospital Stay: Payer: Self-pay

## 2023-09-11 ENCOUNTER — Telehealth: Payer: Self-pay | Admitting: *Deleted

## 2023-09-11 ENCOUNTER — Inpatient Hospital Stay: Payer: Self-pay | Attending: Physician Assistant | Admitting: Physician Assistant

## 2023-09-11 VITALS — BP 125/76 | HR 91 | Temp 98.5°F | Resp 16 | Wt 262.7 lb

## 2023-09-11 DIAGNOSIS — D500 Iron deficiency anemia secondary to blood loss (chronic): Secondary | ICD-10-CM

## 2023-09-11 DIAGNOSIS — M79604 Pain in right leg: Secondary | ICD-10-CM | POA: Insufficient documentation

## 2023-09-11 DIAGNOSIS — D6851 Activated protein C resistance: Secondary | ICD-10-CM

## 2023-09-11 DIAGNOSIS — Z86718 Personal history of other venous thrombosis and embolism: Secondary | ICD-10-CM | POA: Diagnosis not present

## 2023-09-11 DIAGNOSIS — M79605 Pain in left leg: Secondary | ICD-10-CM

## 2023-09-11 DIAGNOSIS — D509 Iron deficiency anemia, unspecified: Secondary | ICD-10-CM | POA: Insufficient documentation

## 2023-09-11 LAB — CBC WITH DIFFERENTIAL (CANCER CENTER ONLY)
Abs Immature Granulocytes: 0.02 10*3/uL (ref 0.00–0.07)
Basophils Absolute: 0.1 10*3/uL (ref 0.0–0.1)
Basophils Relative: 1 %
Eosinophils Absolute: 0.5 10*3/uL (ref 0.0–0.5)
Eosinophils Relative: 6 %
HCT: 41.2 % (ref 36.0–46.0)
Hemoglobin: 13.5 g/dL (ref 12.0–15.0)
Immature Granulocytes: 0 %
Lymphocytes Relative: 23 %
Lymphs Abs: 1.8 10*3/uL (ref 0.7–4.0)
MCH: 28.8 pg (ref 26.0–34.0)
MCHC: 32.8 g/dL (ref 30.0–36.0)
MCV: 88 fL (ref 80.0–100.0)
Monocytes Absolute: 0.6 10*3/uL (ref 0.1–1.0)
Monocytes Relative: 7 %
Neutro Abs: 4.8 10*3/uL (ref 1.7–7.7)
Neutrophils Relative %: 63 %
Platelet Count: 302 10*3/uL (ref 150–400)
RBC: 4.68 MIL/uL (ref 3.87–5.11)
RDW: 12.7 % (ref 11.5–15.5)
WBC Count: 7.8 10*3/uL (ref 4.0–10.5)
nRBC: 0 % (ref 0.0–0.2)

## 2023-09-11 LAB — CMP (CANCER CENTER ONLY)
ALT: 13 U/L (ref 0–44)
AST: 13 U/L — ABNORMAL LOW (ref 15–41)
Albumin: 4.1 g/dL (ref 3.5–5.0)
Alkaline Phosphatase: 88 U/L (ref 38–126)
Anion gap: 4 — ABNORMAL LOW (ref 5–15)
BUN: 11 mg/dL (ref 6–20)
CO2: 30 mmol/L (ref 22–32)
Calcium: 9.1 mg/dL (ref 8.9–10.3)
Chloride: 105 mmol/L (ref 98–111)
Creatinine: 0.83 mg/dL (ref 0.44–1.00)
GFR, Estimated: >60 mL/min (ref >60)
Glucose, Bld: 90 mg/dL (ref 70–99)
Potassium: 3.9 mmol/L (ref 3.5–5.1)
Sodium: 139 mmol/L (ref 135–145)
Total Bilirubin: 0.5 mg/dL (ref 0.0–1.2)
Total Protein: 6.7 g/dL (ref 6.5–8.1)

## 2023-09-11 LAB — MAGNESIUM: Magnesium: 1.8 mg/dL (ref 1.7–2.4)

## 2023-09-11 NOTE — Progress Notes (Signed)
 Symptom Management Consult Note [facility]    Patient Care Team: Benson Eleanor Rung, NP as PCP - General (Internal Medicine)    Name / MRN / DOB: Robin Key  [id number]  [birth year]   Date of visit: 09/11/2023   Chief Complaint/Reason for visit: right leg pain    ASSESSMENT & PLAN: Patient is a 38 y.o. female with pertinent history of iron deficiency anemia and VTE in the setting of heterozygous factor V Leiden followed by Dr. Federico.  I have viewed most recent heme/onc note and lab work.    #Iron deficiency anemia and VTE in the setting of heterozygous factor V Leiden  - Next appointment with Dr. Federico is 11/08/23. - Last US  per chart review: 08/03/21: Findings consistent with acute superficial vein thrombosis involving the left great saphenous vein in the distal thigh to proximal calf.    #Acute right leg pain - Exam with mild swelling of RLE, LLE w/ chronic swelling. Negative Homans sign bilaterally. - Electrolytes to r/o electrolyte deficiency. Labs are WNL. - She has pain in her bilateral lower extremities R>L. As she has had noncompliance with Xarelto  due to insurance issues. Will order bilateral DVT study. Patient is unable to stay today and plans to return tomorrow for scan. - if positive will recommend patient continue xarelto  (she restarted last night) as this is a compliance issue and not treatment failure. -Lengthy discussion had with patient about compliance.  She knows in the future if she has issues getting her Xarelto  to let us  know so we can help in the interim.   Strict ED precautions discussed should symptoms worsen.   Heme/Onc History: Oncology History   No history exists.      Interval history-: Discussed the use of AI scribe software for clinical note transcription with the patient, who gave verbal consent to proceed.  Robin Key is a 38 y.o. female with pertinent history of iron deficiency anemia and VTE in the  setting of heterozygous factor V Leiden presenting to [facility] today with chief complaint of right leg pain. Patient presents unaccompanied to visit today.  She experiences cramping and swelling in her right leg, which began last night. The cramping is described as a tight feeling in the calf, extending to the side of the ankle and the top of the foot. She is also reporting a burning sensation in the inguinal area which is new.  This pain similar to the burning she experiences with her varicose veins.  The cramping in her right lower leg did not resolve with movement and has been constant since onset. She rates the pain as five out of ten, noting that the squeezing itself does not hurt but feels strange. Swelling is noted in the right ankle, while her left leg always has some swelling since her previous blood clot. She has not had any recent strenuous activity. She has mild pain in her left leg currently. She dnies any shortness of breath or chest pain.  She has a history of deep vein thrombosis (DVT) in her left leg. Currently, the symptoms are in her right leg are similar to when she was diagnosed with a DVT in the past. She was off her blood thinner medication due to insurance issues for approximately two and a half weeks, resuming it last night. She has had no recent labwork.   ROS  All other systems are reviewed and are negative for acute change except as noted in the HPI.  Allergies  Allergen Reactions   Ciprofloxacin Other (See Comments)    Felt like bugs crawling when on Lovenox    Neomycin-Bacitracin Zn-Polymyx Rash   Bactrim [Sulfamethoxazole-Trimethoprim] Other (See Comments)    Headache    Fluconazole     Oxycodone  Nausea And Vomiting   Amoxicillin Rash   Miconazole Nitrate Rash   Penicillins Rash     Past Medical History:  Diagnosis Date   Abnormal Pap smear of cervix    Blood dyscrasia    factor 5   DVT (deep vein thrombosis) in pregnancy    Factor V Leiden (HCC)     Headache(784.0)    Infection    UTI   PCOS (polycystic ovarian syndrome)    PONV (postoperative nausea and vomiting)    Pulmonary embolism (HCC) 06/08/2011   Varicose veins      Past Surgical History:  Procedure Laterality Date   BIOPSY  06/27/2022   Procedure: BIOPSY;  Surgeon: Rosalie Kitchens, MD;  Location: [facility];  Service: Gastroenterology;;   bladder stem stretched     CESAREAN SECTION N/A 07/01/2013   Procedure: CESAREAN SECTION;  Surgeon: Lynwood FORBES Curlene PONCE, MD;  Location: [facility];  Service: Obstetrics;  Laterality: N/A;  Primary edc 07/11/13   COLONOSCOPY WITH PROPOFOL  N/A 06/27/2022   Procedure: COLONOSCOPY WITH PROPOFOL ;  Surgeon: Rosalie Kitchens, MD;  Location: [facility];  Service: Gastroenterology;  Laterality: N/A;   ESOPHAGOGASTRODUODENOSCOPY (EGD) WITH PROPOFOL  N/A 06/27/2022   Procedure: ESOPHAGOGASTRODUODENOSCOPY (EGD) WITH PROPOFOL ;  Surgeon: Rosalie Kitchens, MD;  Location: [facility];  Service: Gastroenterology;  Laterality: N/A;    Social History   Socioeconomic History   Marital status: Married    Spouse name: Not on file   Number of children: Not on file   Years of education: Not on file   Highest education level: Not on file  Occupational History   Not on file  Tobacco Use   Smoking status: Never    Passive exposure: Never   Smokeless tobacco: Never  Substance and Sexual Activity   Alcohol use: No   Drug use: No   Sexual activity: Yes    Partners: Male    Birth control/protection: I.U.D.    Comment: menarche 38yo, sexual debut as a teen  Other Topics Concern   Not on file  Social History Narrative   Not on file   Social Drivers of Health   Financial Resource Strain: Not on file  Food Insecurity: Low Risk  (09/20/2022)   [facility]   Hunger Vital Sign    Worried About Running Out of Food in the Last Year: Never true    Within the past 12 months, the food you bought just didn't last and you didn't have money to  get more: Not on file  Transportation Needs: No Transportation Needs (09/20/2022)   [facility]   Transportation    In the past 12 months, has lack of reliable transportation kept you from medical appointments, meetings, work or from getting things needed for daily living? : No  Physical Activity: Not on file  Stress: Not on file  Social Connections: Not on file  Intimate Partner Violence: Not on file    Family History  Problem Relation Age of Onset   Cancer Mother        skin   Hyperlipidemia Mother    Heart disease Father    Colon polyps Father    Miscarriages / Stillbirths Sister  Diabetes Maternal Grandmother    Cancer Maternal Grandmother        breast   Osteoporosis Maternal Grandmother    Glaucoma Maternal Grandmother    Vision loss Maternal Grandmother    Breast cancer Maternal Grandmother    Dementia Maternal Grandmother    Heart disease Maternal Grandfather    Hypertension Maternal Grandfather    Stroke Maternal Grandfather    Dementia Paternal Grandmother    Heart disease Paternal Grandfather    Colon cancer Maternal Uncle    Colon cancer Paternal Aunt      Current Outpatient Medications:    ALPRAZolam (XANAX) 0.5 MG tablet, Take by mouth., Disp: , Rfl:    acetaminophen  (TYLENOL ) 500 MG tablet, Take 1,000 mg by mouth every 6 (six) hours as needed for moderate pain., Disp: , Rfl:    azithromycin  (ZITHROMAX  Z-PAK) 250 MG tablet, Take 2 pills by mouth for 1st day and 1 pill by mouth every day thereafter for a total of 5 days., Disp: 6 each, Rfl: 0   butalbital -acetaminophen -caffeine  (FIORICET , ESGIC ) 50-325-40 MG per tablet, Take 2 tablets by mouth every 6 (six) hours as needed for headache., Disp: 30 tablet, Rfl: 1   cholestyramine  light (PREVALITE ) 4 GM/DOSE powder, Take 4 g by mouth daily., Disp: , Rfl:    cyanocobalamin (VITAMIN B12) 1000 MCG/ML injection, Inject 1,000 mcg into the muscle every 14 (fourteen) days., Disp: , Rfl:     famotidine (PEPCID) 20 MG tablet, Take 20 mg by mouth 2 (two) times daily., Disp: , Rfl:    fluticasone (FLONASE) 50 MCG/ACT nasal spray, Place into both nostrils daily., Disp: , Rfl:    hydrocortisone  (ANUSOL -HC) 2.5 % rectal cream, Place 1 Application rectally 2 (two) times daily., Disp: 30 g, Rfl: 0   hydrocortisone  (ANUSOL -HC) 25 MG suppository, Place 1 suppository (25 mg total) rectally 2 (two) times daily. (Patient taking differently: Place 25 mg rectally 2 (two) times daily as needed for hemorrhoids.), Disp: 12 suppository, Rfl: 0   nystatin  cream (MYCOSTATIN ), Apply topically., Disp: , Rfl:    nystatin -triamcinolone  (MYCOLOG II) cream, SMARTSIG:Topical Morning-Evening, Disp: , Rfl:    ondansetron  (ZOFRAN -ODT) 8 MG disintegrating tablet, Take 8 mg by mouth every 8 (eight) hours as needed for nausea or vomiting., Disp: , Rfl:    pantoprazole (PROTONIX) 40 MG tablet, Take 40 mg by mouth daily., Disp: , Rfl:    promethazine  (PHENERGAN ) 25 MG suppository, Place 25 mg rectally every 6 (six) hours as needed for nausea or vomiting., Disp: , Rfl:    rivaroxaban  (XARELTO ) 20 MG TABS tablet, Take 1 tablet (20 mg total) by mouth daily., Disp: 90 tablet, Rfl: 2   SUMAtriptan (IMITREX) 50 MG tablet, Take 50 mg by mouth every 2 (two) hours as needed for migraine., Disp: , Rfl:    SYRINGE-NEEDLE, DISP, 3 ML 25G X 5/8 3 ML MISC, 1,000 mcg by Misc.(Non-Drug; Combo Route) route every 30 days., Disp: , Rfl:    Vitamin D, Ergocalciferol, (DRISDOL) 1.25 MG (50000 UT) CAPS capsule, Take 50,000 Units by mouth every 7 (seven) days., Disp: , Rfl:   Current Facility-Administered Medications:    levonorgestrel  (MIRENA ) 20 MCG/DAY IUD 1 each, 1 each, Intrauterine, Once, Chrzanowski, Jami B, NP  PHYSICAL EXAM: ECOG FS:1 - Symptomatic but completely ambulatory    Vitals:   09/11/23 1400  BP: 125/76  Pulse: 91  Resp: 16  Temp: 98.5 F (36.9 C)  TempSrc: Oral  SpO2: 100%  Weight: 262 lb 11.2 oz (119.2 kg)  Physical Exam Vitals and nursing note reviewed.  Constitutional:      Appearance: She is not ill-appearing or toxic-appearing.  HENT:     Head: Normocephalic.  Eyes:     Conjunctiva/sclera: Conjunctivae normal.  Cardiovascular:     Rate and Rhythm: Normal rate.  Pulmonary:     Effort: Pulmonary effort is normal.  Abdominal:     General: There is no distension.  Musculoskeletal:     Cervical back: Normal range of motion.     Right lower leg: 1+ Edema present.     Left lower leg: 2+ Edema (chronic per pt) present.     Comments: Normal ROM of BLE. Homans sign absent bilaterally, no lower extremity edema, no palpable cords, compartments are soft    Skin:    General: Skin is warm and dry.     Comments: No palpable right inguinal lymph nodes. Pain is not reproducible. She has prominent veins on right inner thigh.  Neurological:     Mental Status: She is alert.        LABORATORY DATA: I have reviewed the data as listed    Latest Ref Rng & Units 09/11/2023    3:41 PM 05/10/2023   10:26 AM 11/17/2022   10:37 AM  CBC  WBC 4.0 - 10.5 K/uL 7.8  6.1  7.9   Hemoglobin 12.0 - 15.0 g/dL 86.4  86.4  86.0   Hematocrit 36.0 - 46.0 % 41.2  41.5  40.6   Platelets 150 - 400 K/uL 302  278  292         Latest Ref Rng & Units 09/11/2023    3:41 PM 05/10/2023   10:26 AM 11/17/2022   10:37 AM  CMP  Glucose 70 - 99 mg/dL 90  99  898   BUN 6 - 20 mg/dL 11  8  6    Creatinine 0.44 - 1.00 mg/dL 9.16  9.06  9.14   Sodium 135 - 145 mmol/L 139  138  140   Potassium 3.5 - 5.1 mmol/L 3.9  4.1  3.7   Chloride 98 - 111 mmol/L 105  104  103   CO2 22 - 32 mmol/L 30  29  30    Calcium 8.9 - 10.3 mg/dL 9.1  9.5  9.7   Total Protein 6.5 - 8.1 g/dL 6.7  7.0  7.5   Total Bilirubin 0.0 - 1.2 mg/dL 0.5  0.6  0.5   Alkaline Phos 38 - 126 U/L 88  120  120   AST 15 - 41 U/L 13  12  15    ALT 0 - 44 U/L 13  12  20         RADIOGRAPHIC STUDIES (from last 24 hours if applicable) I have personally reviewed  the radiological images as listed and agreed with the findings in the report. No results found.      Visit Diagnosis: 1. Factor V Leiden, prothrombin gene mutation (HCC)   2. Pain in both lower extremities   3. Iron deficiency anemia due to chronic blood loss   4. History of DVT of lower extremity      No orders of the defined types were placed in this encounter.   All questions were answered. The patient knows to call the clinic with any problems, questions or concerns. No barriers to learning was detected.  A total of more than 30 minutes were spent on this encounter with face-to-face time and non-face-to-face time, including preparing to see the patient, ordering  tests and/or medications, counseling the patient and coordination of care as outlined above.    Thank you for allowing me to participate in the care of this patient.    Shakia Sebastiano E  Walisiewicz, PA-C Department of Hematology/Oncology [facility] at [facility] Phone: [phone]  [phone]    09/11/2023 8:46 PM

## 2023-09-11 NOTE — Telephone Encounter (Signed)
 Patient aware that she has been scheduled at [facility] on Wednesday, 2/5 at 11:00 AM for a BLE venous doppler.  Patient knows to arrive 15-minutes early to appointment. Patient confirmed appointment date and time and knows that someone will be in contact with her as soon as we receive the results.

## 2023-09-11 NOTE — Telephone Encounter (Signed)
 Patient called with following concern - she experienced tingling and cramping in right lower leg yesterday, 2/3, evening. This morning has burning sensation starting in groin area that goes down leg - per patient where my femoral artery is.  Denies leg swelling or specific area of redness/warmth.  States she resumed Xarelto  20 mg yesterday, has been off Xarelto  for approximately 2 weeks due to insurance issues.  She stated multiple times during call that she will not go to ED due to exposure to germs as her spouse is immunocompromised.   Information given to I. Thayil, PA in Dr. Lafonda absence.  Robin Key advised patient evaluation in [facility] today. TCT patient with provider recommendation. Patient in agreement. Appt scheduled [facility] at 2p today. Patient informed of appointment time.

## 2023-09-12 ENCOUNTER — Telehealth: Payer: Self-pay

## 2023-09-12 ENCOUNTER — Ambulatory Visit (HOSPITAL_COMMUNITY)
Admission: RE | Admit: 2023-09-12 | Discharge: 2023-09-12 | Disposition: A | Discharge status: Home / Self Care | Payer: BC Managed Care – PPO | Source: Ambulatory Visit | Attending: Physician Assistant | Admitting: Physician Assistant

## 2023-09-12 ENCOUNTER — Encounter: Payer: Self-pay | Admitting: Hematology and Oncology

## 2023-09-12 DIAGNOSIS — M79604 Pain in right leg: Secondary | ICD-10-CM

## 2023-09-12 DIAGNOSIS — D6851 Activated protein C resistance: Secondary | ICD-10-CM | POA: Diagnosis present

## 2023-09-12 DIAGNOSIS — M79605 Pain in left leg: Secondary | ICD-10-CM | POA: Insufficient documentation

## 2023-09-12 NOTE — Telephone Encounter (Signed)
 This RN called patient to f/u regarding DVT study and lab results. DVT study was negative for blood clots and lab results were normal including electrolytes. Pt was advised to f/u with PCP if symptoms continue. Pt verbalized understanding of information provided.

## 2023-10-02 ENCOUNTER — Encounter: Payer: Self-pay | Admitting: Hematology and Oncology

## 2023-10-05 ENCOUNTER — Telehealth: Payer: Self-pay | Admitting: Hematology and Oncology

## 2023-10-08 ENCOUNTER — Encounter: Payer: Self-pay | Admitting: Hematology and Oncology

## 2023-10-09 ENCOUNTER — Other Ambulatory Visit (HOSPITAL_COMMUNITY)
Admission: RE | Admit: 2023-10-09 | Discharge: 2023-10-09 | Disposition: A | Discharge status: Home / Self Care | Source: Ambulatory Visit | Attending: Radiology | Admitting: Radiology

## 2023-10-09 ENCOUNTER — Ambulatory Visit: Payer: Commercial Managed Care - HMO | Admitting: Obstetrics and Gynecology

## 2023-10-09 ENCOUNTER — Ambulatory Visit (INDEPENDENT_AMBULATORY_CARE_PROVIDER_SITE_OTHER): Payer: Commercial Managed Care - HMO | Admitting: Radiology

## 2023-10-09 VITALS — BP 110/68 | Wt 261.2 lb

## 2023-10-09 DIAGNOSIS — Z113 Encounter for screening for infections with a predominantly sexual mode of transmission: Secondary | ICD-10-CM | POA: Diagnosis not present

## 2023-10-09 DIAGNOSIS — L304 Erythema intertrigo: Secondary | ICD-10-CM

## 2023-10-09 DIAGNOSIS — Z1151 Encounter for screening for human papillomavirus (HPV): Secondary | ICD-10-CM | POA: Insufficient documentation

## 2023-10-09 DIAGNOSIS — N870 Mild cervical dysplasia: Secondary | ICD-10-CM | POA: Diagnosis present

## 2023-10-09 DIAGNOSIS — F3281 Premenstrual dysphoric disorder: Secondary | ICD-10-CM

## 2023-10-09 MED ORDER — ESCITALOPRAM OXALATE 5 MG PO TABS: 5.0000 mg | ORAL_TABLET | Freq: Every day | ORAL | 2 refills | Status: DC

## 2023-10-09 MED ORDER — NYSTATIN-TRIAMCINOLONE 100000-0.1 UNIT/GM-% EX OINT: 1.0000 | TOPICAL_OINTMENT | Freq: Two times a day (BID) | CUTANEOUS | 1 refills | Status: AC

## 2023-10-09 NOTE — Progress Notes (Signed)
   Robin Key [birth year] [id number]   History:  38 y.o. G1P1 presents for 6 month pap.Desires STI screening. Complains of possible PMDD, anxiety, moody/emotional the week prior to her period. Coloposcopy 04/11/23 [facility] I P 03/30/23 LSIL, HPV positive. Also rash under pannus.  Gynecologic History No LMP recorded. (Menstrual status: IUD).     Obstetric History OB History  Gravida Para Term Preterm AB Living  1 1 1  0 0 1  SAB IAB Ectopic Multiple Live Births  0 0 0 0 1    # Outcome Date GA Lbr Len/2nd Weight Sex Type Anes PTL Lv  1 Term 07/01/13 [redacted]w[redacted]d  8 lb 4.5 oz (3.755 kg) F CS-LTranv Spinal  LIV        No data to display           The following portions of the patient's history were reviewed and updated as appropriate: allergies, current medications, past family history, past medical history, past social history, past surgical history, and problem list.  Review of Systems  All other systems reviewed and are negative.   Past medical history, past surgical history, family history and social history were all reviewed and documented in the EPIC chart.  Exam:  Vitals:   10/09/23 1139  BP: 110/68  Weight: 261 lb 3.2 oz (118.5 kg)   Body mass index is 40.61 kg/m.  Physical Exam Vitals and nursing note reviewed. Exam conducted with a chaperone present.  Constitutional:      Appearance: Normal appearance. She is well-developed.  Pulmonary:     Effort: Pulmonary effort is normal.  Abdominal:     General: Abdomen is flat.     Palpations: Abdomen is soft.  Genitourinary:    General: Normal vulva.     Vagina: Vaginal discharge present. No erythema, bleeding or lesions.     Cervix: Normal. No discharge, friability, lesion or erythema.     Uterus: Normal.      Adnexa: Right adnexa normal and left adnexa normal.  Neurological:     Mental Status: She is alert.  Psychiatric:        Mood and Affect: Mood normal.        Thought Content: Thought content normal.         Judgment: Judgment normal.      [name], CMA present for exam  Assessment/Plan:   1. Dysplasia of cervix, low grade (CIN 1) (Primary) - Cytology - PAP( [place])  2. Screening for STDs (sexually transmitted diseases) - Cytology - PAP( ) - SURESWAB CT/NG/T. vaginalis  3. PMDD (premenstrual dysphoric disorder) Not a candidate for estrogen, offered prozac by PCP, declines. Willing to try lexapro - escitalopram (LEXAPRO) 5 MG tablet; Take 1 tablet (5 mg total) by mouth daily.  Dispense: 30 tablet; Refill: 2  4. Intertrigo - nystatin-triamcinolone ointment (MYCOLOG); Apply 1 Application topically 2 (two) times daily.  Dispense: 30 g; Refill: 1      Shanasia Ibrahim B WHNP-BC 11:53 AM 10/09/2023

## 2023-10-10 LAB — SURESWAB CT/NG/T. VAGINALIS
C. trachomatis RNA, TMA: NOT DETECTED
N. gonorrhoeae RNA, TMA: NOT DETECTED
Trichomonas vaginalis RNA: NOT DETECTED

## 2023-10-12 LAB — CYTOLOGY - PAP
Adequacy: ABSENT
Chlamydia: NEGATIVE
Comment: NEGATIVE
Comment: NEGATIVE
Comment: NEGATIVE
Comment: NEGATIVE
Comment: NEGATIVE
Comment: NORMAL
HPV 16: NEGATIVE
HPV 18 / 45: NEGATIVE
High risk HPV: POSITIVE — AB
Neisseria Gonorrhea: NEGATIVE
Trichomonas: NEGATIVE

## 2023-10-17 ENCOUNTER — Other Ambulatory Visit: Payer: Self-pay | Admitting: *Deleted

## 2023-10-17 DIAGNOSIS — B977 Papillomavirus as the cause of diseases classified elsewhere: Secondary | ICD-10-CM

## 2023-10-17 DIAGNOSIS — R87612 Low grade squamous intraepithelial lesion on cytologic smear of cervix (LGSIL): Secondary | ICD-10-CM

## 2023-10-31 ENCOUNTER — Encounter: Admitting: Radiology

## 2023-11-06 ENCOUNTER — Other Ambulatory Visit: Payer: Self-pay | Admitting: Hematology and Oncology

## 2023-11-06 ENCOUNTER — Inpatient Hospital Stay (HOSPITAL_BASED_OUTPATIENT_CLINIC_OR_DEPARTMENT_OTHER): Payer: Self-pay | Admitting: Hematology and Oncology

## 2023-11-06 ENCOUNTER — Inpatient Hospital Stay: Payer: Self-pay | Attending: Physician Assistant

## 2023-11-06 VITALS — BP 125/70 | HR 69 | Temp 97.8°F | Resp 14 | Wt 263.2 lb

## 2023-11-06 DIAGNOSIS — Z975 Presence of (intrauterine) contraceptive device: Secondary | ICD-10-CM | POA: Insufficient documentation

## 2023-11-06 DIAGNOSIS — D500 Iron deficiency anemia secondary to blood loss (chronic): Secondary | ICD-10-CM

## 2023-11-06 DIAGNOSIS — N920 Excessive and frequent menstruation with regular cycle: Secondary | ICD-10-CM | POA: Diagnosis present

## 2023-11-06 DIAGNOSIS — Z86718 Personal history of other venous thrombosis and embolism: Secondary | ICD-10-CM | POA: Diagnosis not present

## 2023-11-06 DIAGNOSIS — D6851 Activated protein C resistance: Secondary | ICD-10-CM | POA: Diagnosis not present

## 2023-11-06 DIAGNOSIS — Z7901 Long term (current) use of anticoagulants: Secondary | ICD-10-CM | POA: Insufficient documentation

## 2023-11-06 LAB — CBC WITH DIFFERENTIAL (CANCER CENTER ONLY)
Abs Immature Granulocytes: 0.02 10*3/uL (ref 0.00–0.07)
Basophils Absolute: 0.1 10*3/uL (ref 0.0–0.1)
Basophils Relative: 1 %
Eosinophils Absolute: 0.3 10*3/uL (ref 0.0–0.5)
Eosinophils Relative: 4 %
HCT: 39.1 % (ref 36.0–46.0)
Hemoglobin: 13 g/dL (ref 12.0–15.0)
Immature Granulocytes: 0 %
Lymphocytes Relative: 19 %
Lymphs Abs: 1.5 10*3/uL (ref 0.7–4.0)
MCH: 28.8 pg (ref 26.0–34.0)
MCHC: 33.2 g/dL (ref 30.0–36.0)
MCV: 86.5 fL (ref 80.0–100.0)
Monocytes Absolute: 0.4 10*3/uL (ref 0.1–1.0)
Monocytes Relative: 5 %
Neutro Abs: 5.5 10*3/uL (ref 1.7–7.7)
Neutrophils Relative %: 71 %
Platelet Count: 275 10*3/uL (ref 150–400)
RBC: 4.52 MIL/uL (ref 3.87–5.11)
RDW: 12.5 % (ref 11.5–15.5)
WBC Count: 7.8 10*3/uL (ref 4.0–10.5)
nRBC: 0 % (ref 0.0–0.2)

## 2023-11-06 LAB — RETIC PANEL
Immature Retic Fract: 9.2 % (ref 2.3–15.9)
RBC.: 4.42 MIL/uL (ref 3.87–5.11)
Retic Count, Absolute: 52.2 10*3/uL (ref 19.0–186.0)
Retic Ct Pct: 1.2 % (ref 0.4–3.1)
Reticulocyte Hemoglobin: 32.1 pg (ref >27.9)

## 2023-11-06 LAB — IRON AND IRON BINDING CAPACITY (CC-WL,HP ONLY)
Iron: 98 ug/dL (ref 28–170)
Saturation Ratios: 33 % — ABNORMAL HIGH (ref 10.4–31.8)
TIBC: 298 ug/dL (ref 250–450)
UIBC: 200 ug/dL (ref 148–442)

## 2023-11-06 LAB — CMP (CANCER CENTER ONLY)
ALT: 11 U/L (ref 0–44)
AST: 11 U/L — ABNORMAL LOW (ref 15–41)
Albumin: 4.2 g/dL (ref 3.5–5.0)
Alkaline Phosphatase: 93 U/L (ref 38–126)
Anion gap: 6 (ref 5–15)
BUN: 11 mg/dL (ref 6–20)
CO2: 28 mmol/L (ref 22–32)
Calcium: 9.1 mg/dL (ref 8.9–10.3)
Chloride: 105 mmol/L (ref 98–111)
Creatinine: 0.94 mg/dL (ref 0.44–1.00)
GFR, Estimated: >60 mL/min (ref >60)
Glucose, Bld: 76 mg/dL (ref 70–99)
Potassium: 3.6 mmol/L (ref 3.5–5.1)
Sodium: 139 mmol/L (ref 135–145)
Total Bilirubin: 0.5 mg/dL (ref 0.0–1.2)
Total Protein: 6.8 g/dL (ref 6.5–8.1)

## 2023-11-06 LAB — FERRITIN: Ferritin: 57 ng/mL (ref 11–307)
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
## 2023-11-08 ENCOUNTER — Ambulatory Visit: Payer: Managed Care, Other (non HMO) | Admitting: Hematology and Oncology

## 2023-11-08 ENCOUNTER — Other Ambulatory Visit: Payer: Managed Care, Other (non HMO)

## 2023-12-06 NOTE — Telephone Encounter (Signed)
 Robin Key -please review and advise if OV appropriate

## 2023-12-13 ENCOUNTER — Encounter: Admitting: Radiology

## 2024-01-11 ENCOUNTER — Encounter: Admitting: Radiology

## 2024-01-16 ENCOUNTER — Ambulatory Visit (INDEPENDENT_AMBULATORY_CARE_PROVIDER_SITE_OTHER): Admitting: Radiology

## 2024-01-16 ENCOUNTER — Encounter: Payer: Self-pay | Admitting: Radiology

## 2024-01-16 ENCOUNTER — Other Ambulatory Visit (HOSPITAL_COMMUNITY)
Admission: RE | Admit: 2024-01-16 | Discharge: 2024-01-16 | Disposition: A | Discharge status: Home / Self Care | Payer: Self-pay | Source: Ambulatory Visit | Attending: Radiology | Admitting: Radiology

## 2024-01-16 VITALS — BP 104/66 | HR 88 | Wt 258.0 lb

## 2024-01-16 DIAGNOSIS — Z01812 Encounter for preprocedural laboratory examination: Secondary | ICD-10-CM

## 2024-01-16 DIAGNOSIS — R87612 Low grade squamous intraepithelial lesion on cytologic smear of cervix (LGSIL): Secondary | ICD-10-CM | POA: Diagnosis not present

## 2024-01-16 LAB — PREGNANCY, URINE: Preg Test, Ur: NEGATIVE

## 2024-01-16 NOTE — Progress Notes (Signed)
 38 y.o. G9P1001 female here for colposcopy. 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   Birth control: IUD  Last PAP:    Component Value Date/Time   DIAGPAP - Low grade squamous intraepithelial lesion (LSIL) (A) 10/09/2023 1144   DIAGPAP - Low grade squamous intraepithelial lesion (LSIL) (A) 03/30/2023 1348   HPVHIGH Positive (A) 10/09/2023 1144   HPVHIGH Positive (A) 03/30/2023 1348   ADEQPAP  10/09/2023 1144    Satisfactory for evaluation; transformation zone component ABSENT.   ADEQPAP  03/30/2023 1348    Satisfactory for evaluation; transformation zone component PRESENT.     OB History  Gravida Para Term Preterm AB Living  1 1 1  0 0 1  SAB IAB Ectopic Multiple Live Births  0 0 0 0 1    # Outcome Date GA Lbr Len/2nd Weight Sex Type Anes PTL Lv  1 Term 07/01/13 [redacted]w[redacted]d  8 lb 4.5 oz (3.755 kg) F CS-LTranv Spinal  LIV    Past Medical History:  Diagnosis Date   Abnormal Pap smear of cervix    Blood dyscrasia    factor 5   DVT (deep vein thrombosis) in pregnancy    Factor V Leiden (HCC)    Headache(784.0)    Infection    UTI   PCOS (polycystic ovarian syndrome)    PONV (postoperative nausea and vomiting)    Pulmonary embolism (HCC) 06/08/2011   Varicose veins     Past Surgical History:  Procedure Laterality Date   BIOPSY  06/27/2022   Procedure: BIOPSY;  Surgeon: Ozell Blunt, MD;  Location: [facility];  Service: Gastroenterology;;   bladder stem stretched     CESAREAN SECTION N/A 07/01/2013   Procedure: CESAREAN SECTION;  Surgeon: Jeanmarie Millet, MD;  Location: [facility];  Service: Obstetrics;  Laterality: N/A;  Primary edc 07/11/13   COLONOSCOPY WITH PROPOFOL  N/A 06/27/2022   Procedure: COLONOSCOPY WITH PROPOFOL ;  Surgeon: Ozell Blunt, MD;  Location: [facility];  Service: Gastroenterology;  Laterality: N/A;   ESOPHAGOGASTRODUODENOSCOPY (EGD) WITH  PROPOFOL  N/A 06/27/2022   Procedure: ESOPHAGOGASTRODUODENOSCOPY (EGD) WITH PROPOFOL ;  Surgeon: Ozell Blunt, MD;  Location: [facility];  Service: Gastroenterology;  Laterality: N/A;    Current Outpatient Medications on File Prior to Visit  Medication Sig Dispense Refill   acetaminophen  (TYLENOL ) 500 MG tablet Take 1,000 mg by mouth every 6 (six) hours as needed for moderate pain.     ALPRAZolam (XANAX) 0.5 MG tablet Take by mouth.     azithromycin  (ZITHROMAX  Z-PAK) 250 MG tablet Take 2 pills by mouth for 1st day and 1 pill by mouth every day thereafter for a total of 5 days. 6 each 0   butalbital -acetaminophen -caffeine  (FIORICET , ESGIC ) 50-325-40 MG per tablet Take 2 tablets by mouth every 6 (six) hours as needed for headache. 30 tablet 1   cholestyramine light (PREVALITE) 4 GM/DOSE powder Take 4 g by mouth daily.     cyanocobalamin (VITAMIN B12) 1000 MCG/ML injection Inject 1,000 mcg into the muscle every 14 (fourteen) days.     escitalopram  (LEXAPRO ) 5 MG tablet Take 1 tablet (5 mg total) by mouth daily. 30 tablet 2   famotidine (PEPCID) 20 MG tablet Take 20 mg by mouth 2 (two) times daily.     fluticasone (FLONASE) 50 MCG/ACT nasal spray Place into both nostrils daily.     hydrocortisone  (ANUSOL -HC) 2.5 % rectal cream Place 1 Application  rectally 2 (two) times daily. 30 g 0   hydrocortisone  (ANUSOL -HC) 25 MG suppository Place 1 suppository (25 mg total) rectally 2 (two) times daily. (Patient taking differently: Place 25 mg rectally 2 (two) times daily as needed for hemorrhoids.) 12 suppository 0   nystatin  cream (MYCOSTATIN ) Apply topically.     nystatin -triamcinolone  ointment (MYCOLOG) Apply 1 Application topically 2 (two) times daily. 30 g 1   ondansetron  (ZOFRAN -ODT) 8 MG disintegrating tablet Take 8 mg by mouth every 8 (eight) hours as needed for nausea or vomiting.     pantoprazole (PROTONIX) 40 MG tablet Take 40 mg by mouth daily.     promethazine  (PHENERGAN ) 25 MG suppository Place 25  mg rectally every 6 (six) hours as needed for nausea or vomiting.     rivaroxaban  (XARELTO ) 20 MG TABS tablet Take 1 tablet (20 mg total) by mouth daily. 90 tablet 2   SUMAtriptan (IMITREX) 50 MG tablet Take 50 mg by mouth every 2 (two) hours as needed for migraine.     SYRINGE-NEEDLE, DISP, 3 ML 25G X 5/8 3 ML MISC 1,000 mcg by Misc.(Non-Drug; Combo Route) route every 30 days.     Vitamin D, Ergocalciferol, (DRISDOL) 1.25 MG (50000 UT) CAPS capsule Take 50,000 Units by mouth every 7 (seven) days.     Current Facility-Administered Medications on File Prior to Visit  Medication Dose Route Frequency Provider Last Rate Last Admin   levonorgestrel  (MIRENA ) 20 MCG/DAY IUD 1 each  1 each Intrauterine Once Haillie Radu B, NP        Allergies  Allergen Reactions   Ciprofloxacin Other (See Comments)    Felt like bugs crawling when on Lovenox    Neomycin-Bacitracin Zn-Polymyx Rash   Bactrim [Sulfamethoxazole-Trimethoprim] Other (See Comments)    Headache    Fluconazole     Oxycodone  Nausea And Vomiting   Amoxicillin Rash   Miconazole Nitrate Rash   Penicillins Rash      PE Today's Vitals   01/16/24 0946  BP: 104/66  Pulse: 88  SpO2: 99%  Weight: 258 lb (117 kg)   Body mass index is 40.11 kg/m.  Physical Exam Vitals and nursing note reviewed. Exam conducted with a chaperone present.  Constitutional:      Appearance: Normal appearance. She is obese.  Pulmonary:     Effort: Pulmonary effort is normal.  Genitourinary:       Comments: Acetowhite changes 7oclock, mosaic 5oclock Neurological:     Mental Status: She is alert.  Psychiatric:        Mood and Affect: Mood normal.        Thought Content: Thought content normal.        Judgment: Judgment normal.      Colposcopy Procedure Consented for procedure.  Time out performed. Speculum placed in vagina.  Acetic acid 3% was applied to cervix.  Satisfactory colposcopy. TZ was completely seen. Biopsies taken Yes.    Location(s) - 5 and 7 oclock  Findings: CIN I/II ECC was performed. yes Specimens to pathology separately.  Monsel's applied to biopsy areas.   Good hemostasis.  Minimal EBL. No complications.  Tolerated well.     Assessment and Plan:       1. 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 Encounter for preprocedural laboratory examination - Pregnancy, urine; neg  Abnormal PAP results reviewed. ASCCP guidelines reviewed. Consents signed. Satisfactory colposcopy, ECC and biopsy performed. Aftercare instructions provided.  Will call with results. I also recommend  completion of Gardasil vaccine up to age 45yo, avoidance of smoking, and use of condoms with new sexual partners to prevent progression of disease.   Jailee Jaquez B, NP

## 2024-01-17 ENCOUNTER — Encounter: Payer: Self-pay | Admitting: Hematology and Oncology

## 2024-01-18 ENCOUNTER — Ambulatory Visit: Payer: Self-pay | Admitting: Radiology

## 2024-01-18 NOTE — Progress Notes (Signed)
 Persistent CIN1, per EB please schedule consult to discuss LEEP with her (pt would prefer OR LEEP)

## 2024-01-22 ENCOUNTER — Encounter: Payer: Self-pay | Admitting: Hematology and Oncology

## 2024-02-05 ENCOUNTER — Ambulatory Visit (INDEPENDENT_AMBULATORY_CARE_PROVIDER_SITE_OTHER): Admitting: Obstetrics and Gynecology

## 2024-02-05 VITALS — BP 106/68 | HR 88 | Ht 68.0 in | Wt 258.9 lb

## 2024-02-05 DIAGNOSIS — N870 Mild cervical dysplasia: Secondary | ICD-10-CM | POA: Diagnosis not present

## 2024-02-05 NOTE — Progress Notes (Unsigned)
   Acute Office Visit  Subjective:    Patient ID: Robin Key, female    DOB: 10/25/1985, 38 y.o.   MRN: [id number]   HPI 38 y.o. presents today for Consult (Discuss LEEP (pt would prefer OR LEEP)/Also complains of ingrown hair--left groin area (had wax a few weeks ago).) .from wax. No fevers or pus G1P1001 female here for colposcopy. P 10/09/23 LSIL, HPV positive Coloposcopy 04/11/23 CIN I P 03/30/23 LSIL, HPV positive   09/10/14 LGSIL HPV DNA+ Has completed gardesil vaccination  Reports possible cryo in 2014 and this was extremely painful Has had an EMB as well  Patient asked if she desires more children with risk of LEEP and cervical incompetence and she could not answer. Does have a child with Elher's Danlos, father with A2DM.  Patient's last menstrual period was 12/11/2023 (exact date).    Review of Systems     Objective:    OBGyn Exam  LMP 12/11/2023 (Exact Date)  Wt Readings from Last 3 Encounters:  01/16/24 258 lb (117 kg)  11/06/23 263 lb 3.2 oz (119.4 kg)  10/09/23 261 lb 3.2 oz (118.5 kg)      Assessment & Plan:  LGSIL in 2016 and then again starting in 2024 Gardesil complete Patient reliable and states she will repeat pap smears  Offered LEEP procedure and counseled on the procedure.  Patient is unsure on future child bearing and would like to think about repeat pap smear vs. Leep.  Discussed starting the two year mark at 2024 to lean on the conservative side with moving towards the LEEP as well.  She will consider her options and agreed to close follow-up either with repeat pap smear in 6 months from colpo or with LEEP.  30 minutes spent on reviewing records, imaging,  and one on one patient time and counseling patient and documentation Dr. Glennon Almarie MARLA Glennon

## 2024-03-15 ENCOUNTER — Encounter: Payer: Self-pay | Admitting: Hematology and Oncology

## 2024-03-15 ENCOUNTER — Encounter: Payer: Self-pay | Admitting: Obstetrics and Gynecology

## 2024-03-17 ENCOUNTER — Telehealth: Payer: Self-pay

## 2024-03-17 ENCOUNTER — Inpatient Hospital Stay: Attending: Hematology and Oncology

## 2024-03-17 ENCOUNTER — Other Ambulatory Visit: Payer: Self-pay

## 2024-03-17 DIAGNOSIS — N920 Excessive and frequent menstruation with regular cycle: Secondary | ICD-10-CM | POA: Insufficient documentation

## 2024-03-17 DIAGNOSIS — Z86718 Personal history of other venous thrombosis and embolism: Secondary | ICD-10-CM | POA: Insufficient documentation

## 2024-03-17 DIAGNOSIS — D500 Iron deficiency anemia secondary to blood loss (chronic): Secondary | ICD-10-CM

## 2024-03-17 DIAGNOSIS — D6851 Activated protein C resistance: Secondary | ICD-10-CM | POA: Diagnosis present

## 2024-03-17 LAB — CMP (CANCER CENTER ONLY)
ALT: 14 U/L (ref 0–44)
AST: 13 U/L — ABNORMAL LOW (ref 15–41)
Albumin: 4.5 g/dL (ref 3.5–5.0)
Alkaline Phosphatase: 100 U/L (ref 38–126)
Anion gap: 5 (ref 5–15)
BUN: 13 mg/dL (ref 6–20)
CO2: 31 mmol/L (ref 22–32)
Calcium: 9.3 mg/dL (ref 8.9–10.3)
Chloride: 102 mmol/L (ref 98–111)
Creatinine: 0.92 mg/dL (ref 0.44–1.00)
GFR, Estimated: >60 mL/min (ref >60)
Glucose, Bld: 92 mg/dL (ref 70–99)
Potassium: 3.9 mmol/L (ref 3.5–5.1)
Sodium: 138 mmol/L (ref 135–145)
Total Bilirubin: 0.4 mg/dL (ref 0.0–1.2)
Total Protein: 7.2 g/dL (ref 6.5–8.1)

## 2024-03-17 LAB — CBC WITH DIFFERENTIAL (CANCER CENTER ONLY)
Abs Immature Granulocytes: 0.02 K/uL (ref 0.00–0.07)
Basophils Absolute: 0 K/uL (ref 0.0–0.1)
Basophils Relative: 1 %
Eosinophils Absolute: 0.2 K/uL (ref 0.0–0.5)
Eosinophils Relative: 3 %
HCT: 41.1 % (ref 36.0–46.0)
Hemoglobin: 13.8 g/dL (ref 12.0–15.0)
Immature Granulocytes: 0 %
Lymphocytes Relative: 19 %
Lymphs Abs: 1.5 K/uL (ref 0.7–4.0)
MCH: 29.1 pg (ref 26.0–34.0)
MCHC: 33.6 g/dL (ref 30.0–36.0)
MCV: 86.5 fL (ref 80.0–100.0)
Monocytes Absolute: 0.4 K/uL (ref 0.1–1.0)
Monocytes Relative: 5 %
Neutro Abs: 5.5 K/uL (ref 1.7–7.7)
Neutrophils Relative %: 72 %
Platelet Count: 280 K/uL (ref 150–400)
RBC: 4.75 MIL/uL (ref 3.87–5.11)
RDW: 12.3 % (ref 11.5–15.5)
WBC Count: 7.6 K/uL (ref 4.0–10.5)
nRBC: 0 % (ref 0.0–0.2)

## 2024-03-17 LAB — RETIC PANEL
Immature Retic Fract: 9.9 % (ref 2.3–15.9)
RBC.: 4.74 MIL/uL (ref 3.87–5.11)
Retic Count, Absolute: 46 K/uL (ref 19.0–186.0)
Retic Ct Pct: 1 % (ref 0.4–3.1)
Reticulocyte Hemoglobin: 33.5 pg (ref >27.9)

## 2024-03-17 LAB — IRON AND IRON BINDING CAPACITY (CC-WL,HP ONLY)
Iron: 93 ug/dL (ref 28–170)
Saturation Ratios: 28 % (ref 10.4–31.8)
TIBC: 336 ug/dL (ref 250–450)
UIBC: 243 ug/dL (ref 148–442)

## 2024-03-17 NOTE — Telephone Encounter (Signed)
 Spoke with patient in regards to rechecking labs.  She reports feeling extremely fatigued and craving foods like red meats and beets over the past week.  Per Dr. Federico, patient can recheck labs. Ordered placed. Informed patient and made lab appt for today at 4 pm.  She verbalizing understanding.

## 2024-03-18 LAB — FERRITIN: Ferritin: 99 ng/mL (ref 11–307)

## 2024-03-21 ENCOUNTER — Ambulatory Visit: Admitting: Radiology

## 2024-03-26 ENCOUNTER — Telehealth: Payer: Self-pay | Admitting: *Deleted

## 2024-03-26 DIAGNOSIS — G8929 Other chronic pain: Secondary | ICD-10-CM

## 2024-03-26 NOTE — Telephone Encounter (Signed)
 Spoke with patient.   Reports RLQ pain for the past 2 weeks. UPT negative. Can feel IUD strings. LMP June 2025. Pain currently 5 out of 10 on pain scale, taking Tylenol  PRN for pain. Unable to take motrin .   Was seen in ER on 8/19, no pelvic exam or PUS, CT scan completed. States she was advised to f/u with pain clinic.   Denies fever/chills, or vomiting. Nausea with pain and certain foods.   Bowel movements regular.   Advised of recommendation of PUS per Jami, can add on at 1000 on 03/27/24. Patient has questions regarding OOP cost and balance, call transferred to front office supervisor to review payment options and balance.   Routing to [employer]  Cc: Arland

## 2024-03-26 NOTE — Telephone Encounter (Signed)
 Patient is scheduled for PUS on 8/21 at 1000.   Order placed.   Encounter closed.

## 2024-03-27 ENCOUNTER — Ambulatory Visit (INDEPENDENT_AMBULATORY_CARE_PROVIDER_SITE_OTHER)

## 2024-03-27 ENCOUNTER — Ambulatory Visit (INDEPENDENT_AMBULATORY_CARE_PROVIDER_SITE_OTHER): Admitting: Radiology

## 2024-03-27 ENCOUNTER — Encounter: Payer: Self-pay | Admitting: Radiology

## 2024-03-27 VITALS — BP 116/74

## 2024-03-27 DIAGNOSIS — R1031 Right lower quadrant pain: Secondary | ICD-10-CM

## 2024-03-27 DIAGNOSIS — R102 Pelvic and perineal pain: Secondary | ICD-10-CM | POA: Diagnosis not present

## 2024-03-27 DIAGNOSIS — G8929 Other chronic pain: Secondary | ICD-10-CM | POA: Diagnosis not present

## 2024-03-27 NOTE — Progress Notes (Signed)
   Robin Key [birth year] [id number]   History:  38 y.o. G1P1 c/p RLQ pain, was seen in ED, normal CT. Hx of PCOS and endometriosis. Worried she has another ovarian cyst.  Gynecologic History No LMP recorded. (Menstrual status: IUD).   Contraception/Family planning: IUD Sexually active: yes Last Pap: 2025 LSIL. Waiting to schedule LEEP  Obstetric History OB History  Gravida Para Term Preterm AB Living  1 1 1  0 0 1  SAB IAB Ectopic Multiple Live Births  0 0 0 0 1    # Outcome Date GA Lbr Len/2nd Weight Sex Type Anes PTL Lv  1 Term 07/01/13 [redacted]w[redacted]d  8 lb 4.5 oz (3.755 kg) F CS-LTranv Spinal  LIV    Narrative & Impression Indication:RLQ pain   Vaginal ultrasound   Anteverted uterus, normal size and shape  No myometrial masses   IUD centrally in the endometrial canal   Thin symmetrical endometrium  No masses or thickening seen   Both ovaries normal size with normal follicle pattern and perfusion   No adnexal masses   No free fluid   Impression:normal gyn u/s        Exam Ended: 03/27/24 10:18 Last Resulted: 03/27/24 10:42     The following portions of the patient's history were reviewed and updated as appropriate: allergies, current medications, past family history, past medical history, past social history, past surgical history, and problem list.  ROS  Past medical history, past surgical history, family history and social history were all reviewed and documented in the EPIC chart.  Exam:  Vitals:   03/27/24 1020  BP: 116/74   There is no height or weight on file to calculate BMI.  Physical Exam Vitals reviewed.  Constitutional:      Appearance: Normal appearance. She is obese.  Neurological:     Mental Status: She is alert.  Psychiatric:        Mood and Affect: Mood normal.        Thought Content: Thought content normal.        Judgment: Judgment normal.      Darice Hoit, CMA present for exam  Assessment/Plan:   1. Pelvic pain (Primary) May  be related to endometriosis Not a candidate for estrogen containing medications. Not interested in hyst or lap to manage at this time Will continue to monitor Encouraged to schedule LEEP    AEX scheduled next week  Brooke Steinhilber B WHNP-BC 10:45 AM 03/27/2024

## 2024-03-28 ENCOUNTER — Ambulatory Visit: Admitting: Radiology

## 2024-04-03 ENCOUNTER — Encounter: Payer: Self-pay | Admitting: Radiology

## 2024-04-03 ENCOUNTER — Ambulatory Visit (INDEPENDENT_AMBULATORY_CARE_PROVIDER_SITE_OTHER): Admitting: Radiology

## 2024-04-03 VITALS — BP 116/72 | HR 87 | Ht 69.0 in | Wt 258.0 lb

## 2024-04-03 DIAGNOSIS — Z1331 Encounter for screening for depression: Secondary | ICD-10-CM

## 2024-04-03 DIAGNOSIS — N6452 Nipple discharge: Secondary | ICD-10-CM | POA: Diagnosis not present

## 2024-04-03 DIAGNOSIS — Z01419 Encounter for gynecological examination (general) (routine) without abnormal findings: Secondary | ICD-10-CM | POA: Diagnosis not present

## 2024-04-03 DIAGNOSIS — Z113 Encounter for screening for infections with a predominantly sexual mode of transmission: Secondary | ICD-10-CM

## 2024-04-03 DIAGNOSIS — R6882 Decreased libido: Secondary | ICD-10-CM | POA: Diagnosis not present

## 2024-04-03 DIAGNOSIS — B977 Papillomavirus as the cause of diseases classified elsewhere: Secondary | ICD-10-CM

## 2024-04-03 DIAGNOSIS — F3281 Premenstrual dysphoric disorder: Secondary | ICD-10-CM

## 2024-04-03 MED ORDER — BUPROPION HCL ER (XL) 150 MG PO TB24: 150.0000 mg | ORAL_TABLET | Freq: Every day | ORAL | 3 refills | Status: DC

## 2024-04-03 NOTE — Patient Instructions (Signed)

## 2024-04-03 NOTE — Progress Notes (Signed)
 Robin Key 1986/07/21 [id number]   History:  38 y.o. G1P1 presents for annual exam. Would like STI screen. C/o bilateral nipple d/c with stimulation. Normal pelvic u/s last week. IUD in place.  Hx of LEEP. LSIL pap this year with LSIL colpo, having repeat this fall. C/o low libido would like testosterone checked. Tried lexapro  for PMDD did not like it, interested in something else that also does not have the sexual side effects.  Gynecologic History No LMP recorded. (Menstrual status: IUD). Period Cycle (Days):  (irregular spotting with Mirena ) Contraception/Family planning: IUD Sexually active: yes Last Pap: 2025. Results were: abnormal   Obstetric History OB History  Gravida Para Term Preterm AB Living  1 1 1  0 0 1  SAB IAB Ectopic Multiple Live Births  0 0 0 0 1    # Outcome Date GA Lbr Len/2nd Weight Sex Type Anes PTL Lv  1 Term 07/01/13 [redacted]w[redacted]d  8 lb 4.5 oz (3.755 kg) F CS-LTranv Spinal  LIV       04/03/2024    1:42 PM  Depression screen PHQ 2/9  Decreased Interest 0  Down, Depressed, Hopeless 0  PHQ - 2 Score 0     The following portions of the patient's history were reviewed and updated as appropriate: allergies, current medications, past family history, past medical history, past social history, past surgical history, and problem list.  Review of Systems  All other systems reviewed and are negative.   Past medical history, past surgical history, family history and social history were all reviewed and documented in the EPIC chart.  Exam:  Vitals:   04/03/24 1340  BP: 116/72  Pulse: 87  SpO2: 99%  Weight: 258 lb (117 kg)  Height: 5' 9 (1.753 m)   Body mass index is 38.1 kg/m.  Physical Exam Vitals and nursing note reviewed. Exam conducted with a chaperone present.  Constitutional:      Appearance: Normal appearance. She is normal weight.  HENT:     Head: Normocephalic and atraumatic.  Neck:     Thyroid : No thyroid  mass, thyromegaly or thyroid   tenderness.  Cardiovascular:     Rate and Rhythm: Regular rhythm.     Heart sounds: Normal heart sounds.  Pulmonary:     Effort: Pulmonary effort is normal.     Breath sounds: Normal breath sounds.  Chest:  Breasts:    Breasts are symmetrical.     Right: Normal. No inverted nipple, mass, nipple discharge, skin change or tenderness.     Left: Normal. No inverted nipple, mass, nipple discharge, skin change or tenderness.  Abdominal:     General: Abdomen is flat. Bowel sounds are normal.     Palpations: Abdomen is soft.  Genitourinary:    General: Normal vulva.     Vagina: Normal. No vaginal discharge, bleeding or lesions.     Cervix: Normal. No discharge or lesion.     Uterus: Normal. Not enlarged and not tender.      Adnexa: Right adnexa normal and left adnexa normal.       Right: No mass, tenderness or fullness.         Left: No mass, tenderness or fullness.    Lymphadenopathy:     Upper Body:     Right upper body: No axillary adenopathy.     Left upper body: No axillary adenopathy.  Skin:    General: Skin is warm and dry.  Neurological:     Mental Status: She is alert and  oriented to person, place, and time.  Psychiatric:        Mood and Affect: Mood normal.        Thought Content: Thought content normal.        Judgment: Judgment normal.      Darice Hoit, CMA present for exam  Assessment/Plan:   1. 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 Screening for STDs (sexually transmitted diseases) - HIV antibody (with reflex) - RPR - Hepatitis C antibody - SURESWAB CT/NG/T. vaginalis - SURESWAB CT/NG/T. vaginalis  4. Nipple discharge - Thyroid  Panel With TSH - Prolactin  5. Low libido - Testos,Total,Free and SHBG (Female)  6. Depression screen  7. PMDD (premenstrual dysphoric disorder) - buPROPion  (WELLBUTRIN  XL) 150 MG 24 hr tablet; Take 1 tablet (150 mg total) by mouth daily.  Dispense: 90 tablet; Refill: 3    Return  in about 1 year (around 04/03/2025) for Annual.  GINETTE COZIER B WHNP-BC 2:41 PM 04/03/2024

## 2024-04-04 ENCOUNTER — Ambulatory Visit: Payer: Self-pay | Admitting: Radiology

## 2024-04-04 LAB — SURESWAB CT/NG/T. VAGINALIS
C. trachomatis RNA, TMA: NOT DETECTED
C. trachomatis RNA, TMA: NOT DETECTED
N. gonorrhoeae RNA, TMA: NOT DETECTED
N. gonorrhoeae RNA, TMA: NOT DETECTED
Trichomonas vaginalis RNA: NOT DETECTED
Trichomonas vaginalis RNA: NOT DETECTED

## 2024-04-09 LAB — PROLACTIN: Prolactin: 6.4 ng/mL

## 2024-04-09 LAB — THYROID PANEL WITH TSH
Free Thyroxine Index: 1.9 (ref 1.4–3.8)
T3 Uptake: 27 % (ref 22–35)
T4, Total: 7.2 ug/dL (ref 5.1–11.9)
TSH: 1.24 m[IU]/L

## 2024-04-09 LAB — TESTOS,TOTAL,FREE AND SHBG (FEMALE)
Free Testosterone: 8.9 pg/mL — ABNORMAL HIGH (ref 0.1–6.4)
Sex Hormone Binding: 38 nmol/L (ref 17–124)
Testosterone, Total, LC-MS-MS: 69 ng/dL — ABNORMAL HIGH (ref 2–45)

## 2024-04-09 LAB — HEPATITIS C ANTIBODY: Hepatitis C Ab: NONREACTIVE

## 2024-04-09 LAB — HIV ANTIBODY (ROUTINE TESTING W REFLEX): HIV 1&2 Ab, 4th Generation: NONREACTIVE

## 2024-04-09 LAB — RPR: RPR Ser Ql: NONREACTIVE

## 2024-04-10 ENCOUNTER — Telehealth: Payer: Self-pay | Admitting: *Deleted

## 2024-04-10 NOTE — Telephone Encounter (Signed)
 Patient is in PAP recall.   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 Glennon 02/05/24  AEX with JC 04/03/24  Patient states previous provider did not send all records, states she discussed with Jami at [facility]. Patient is uncertain if she wants to repeat pap in 6 months or consider LEEP vs Hysterectomy.   Patient would like recommendations and testosterone labs from 04/03/24 reviewed. Requesting detailed message on voicemail or MyChart message wtth results and recommendations.   Routing to [place] to review

## 2024-04-16 NOTE — Telephone Encounter (Signed)
 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 Testosterone was normal.

## 2024-04-16 NOTE — Telephone Encounter (Signed)
 Spoke with patient, advised per Jami.   Patient declines consult with Dr. Glennon at this time.   Declines to schedule PAP or LEEP at this time.  Patient does not qualify for BCCCP program.   Patient declines hysterectomy.   Patient request OOP cost for LEEP, message sent to business office for return call.   Pap recall placed for 07/2024.   Patient aware to call if she has questions or is ready to schedule.   Routing to Jami

## 2024-04-18 DIAGNOSIS — N809 Endometriosis, unspecified: Secondary | ICD-10-CM

## 2024-04-18 NOTE — Telephone Encounter (Signed)
 MRI is not a great indicator of endometriosis. We can do it, but it may not show the lesions, often they are only seen in surgery.

## 2024-04-22 NOTE — Telephone Encounter (Signed)
 Order was placed & faxed for MRI to [facility]. Confirmation received. Message sent to patient to let her know.

## 2024-04-22 NOTE — Telephone Encounter (Signed)
 Please see patient response. Order placed on your desk to be signed. Please return to me to be faxed to [facility] at [phone]

## 2024-04-23 NOTE — Telephone Encounter (Signed)
 Joy -fax number is the same. Do you have the order you can send again?

## 2024-04-28 NOTE — Addendum Note (Signed)
 Addended by: BRUTUS KATE SAILOR on: 04/28/2024 03:02 PM   Modules accepted: Orders

## 2024-04-28 NOTE — Telephone Encounter (Signed)
 Call placed to [facility] Scheduling  [phone]. Spoke to Ezzelle. Order was received, missing information. Update order and fax to [phone]. Was advised authorization will need to be determined prior to scheduling.   Order pended for EPIC and benefits for MRI pelvis w/wo contrast.  CPT 27802  Jami -please review external order in EPIC. Order printed to be signed and faxed.   Cc: Miranda

## 2024-04-28 NOTE — Telephone Encounter (Signed)
 On Friday 04-24-24 [facility] faxed the MRI order to a different number given to her [phone]. Today I re-faxed it to that number and also to [phone]. This is the fourth time I have faxed to [phone]. I got confirmations on both today that they were received.

## 2024-04-29 NOTE — Telephone Encounter (Signed)
 Call placed to [facility], spoke with Dena. Was advised this has not been added to their WQ yet, states this is not unusual. Office number provided, [phone] opt 4, to call if not received. Fax number confirmed [phone].   Patient notified. Patient expressed concern regarding scheduling. Patient expressed concerned regarding lack of update provided. Reviewed ordering process for external facilities and authorization. Questions answered.   Advised per Miranda -MRI Pelvis w/wo contrast authorized 04/29/24 -05/31/24. Cena will provide this information to the imaging facility.   Advised patient to return call if unable to schedule or if she schedules after 05/31/24, new authorization will need to be obtained. Questions answered.   Routing to [employer] Triage

## 2024-04-29 NOTE — Addendum Note (Signed)
 Addended by: BRUTUS KATE SAILOR on: 04/29/2024 12:04 PM   Modules accepted: Orders

## 2024-04-29 NOTE — Telephone Encounter (Signed)
 I have faxed the order and got a successful conformation that the fax did go.

## 2024-04-30 NOTE — Telephone Encounter (Addendum)
 Call placed to [facility], spoke with Tinnie. Was advised fax not received, states she has 2 in their WQ that she can not access, can not confirm if this is one of them. Lauren states she spoke with this patient last week and explained this process. Fax number once again confirmed. Was provided alternative fax numbers of [phone] and [phone]. I requested to be contacted directly at [phone] once the fax is received.   MRI Order re-faxed to both numbers provided and confirmed.    This message was sent via FAXCOM, a product from [employer]. http://www.biscom.com/                    -------Fax Transmission Report-------  To:               Recipient at [phone] Subject:          SECURE: MRI Order Result:           The transmission was successful. Explanation:      All Pages Ok Pages Sent:       2 Connect Time:     1 minutes, 0 seconds Transmit Time:    04/30/2024 10:57 Transfer Rate:    14400 Status Code:      0000 Retry Count:      0 Job Id:           [id number] Unique Id:        [id number] Fax Line:         53 Fax Server:       [email or web address]   This message was sent via FAXCOM, a product from [employer]. [email or web address].biscom.com/                    -------Fax Transmission Report-------  To:               Recipient at [phone] Subject:          SECURE: MRI Result:           The transmission was successful. Explanation:      All Pages Ok Pages Sent:       2 Connect Time:     1 minutes, 12 seconds Transmit Time:    04/30/2024 10:57 Transfer Rate:    14400 Status Code:      0000 Retry Count:      0 Job Id:           [id number] Unique Id:        [id number] Fax Line:         3 Fax Server:       [employer]

## 2024-05-08 ENCOUNTER — Other Ambulatory Visit: Payer: Self-pay | Admitting: Physician Assistant

## 2024-05-08 ENCOUNTER — Other Ambulatory Visit: Payer: Self-pay | Admitting: Hematology and Oncology

## 2024-05-08 DIAGNOSIS — D500 Iron deficiency anemia secondary to blood loss (chronic): Secondary | ICD-10-CM

## 2024-05-08 DIAGNOSIS — Z86718 Personal history of other venous thrombosis and embolism: Secondary | ICD-10-CM

## 2024-05-08 NOTE — Progress Notes (Deleted)
 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 Amadeo 11/17/2022: transition care to Dr. Federico   Interval History:  Robin Key 38 y.o. 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 The patient's last visit was on 11/06/2023. In the interim since the last visit she has been at her baseline level of health.  On exam today Ms. Key reports ***  MEDICAL HISTORY:  Past Medical History:  Diagnosis Date   Abnormal Pap smear of cervix    Blood dyscrasia    factor 5   DVT (deep vein thrombosis) in pregnancy    Factor V Leiden    Headache(784.0)    Infection    UTI   PCOS (polycystic ovarian syndrome)    PONV (postoperative nausea and vomiting)    Pulmonary embolism (HCC) 06/08/2011   Varicose veins     SURGICAL HISTORY: Past Surgical History:  Procedure Laterality Date   BIOPSY  06/27/2022   Procedure: BIOPSY;  Surgeon: Rosalie Kitchens, MD;  Location: [facility];  Service: Gastroenterology;;   bladder stem stretched     CESAREAN SECTION N/A 07/01/2013   Procedure: CESAREAN SECTION;  Surgeon: Lynwood FORBES Curlene PONCE, MD;  Location: [facility];  Service: Obstetrics;  Laterality: N/A;  Primary edc 07/11/13   COLONOSCOPY WITH PROPOFOL  N/A 06/27/2022   Procedure: COLONOSCOPY WITH PROPOFOL ;  Surgeon: Rosalie Kitchens, MD;  Location: [facility];  Service: Gastroenterology;  Laterality: N/A;   ESOPHAGOGASTRODUODENOSCOPY (EGD) WITH PROPOFOL  N/A 06/27/2022   Procedure: ESOPHAGOGASTRODUODENOSCOPY (EGD) WITH PROPOFOL ;  Surgeon: Rosalie Kitchens, MD;  Location: [facility];  Service: Gastroenterology;   Laterality: N/A;    SOCIAL HISTORY: Social History   Socioeconomic History   Marital status: Married    Spouse name: Not on file   Number of children: Not on file   Years of education: Not on file   Highest education level: Not on file  Occupational History   Not on file  Tobacco Use   Smoking status: Never    Passive exposure: Never   Smokeless tobacco: Never  Substance and Sexual Activity   Alcohol use: No   Drug use: No   Sexual activity: Yes    Partners: Male    Birth control/protection: I.U.D.    Comment: menarche 38yo, sexual debut as a teen  Other Topics Concern   Not on file  Social History Narrative   Not on file   Social Drivers of Health   Financial Resource Strain: Not on file  Food Insecurity: Low Risk  (09/20/2022)   Received from [facility]   Hunger Vital Sign    Within the past 12 months, you worried that your food would run out before you got money to buy more: Never true    Within the past 12 months, the food you bought just didn't last and you didn't have money to get more: Not on file  Transportation Needs: No Transportation Needs (09/20/2022)   Received from [facility]    In the past 12 months, has lack of reliable transportation kept you from medical appointments, meetings, work or from getting things needed for daily living? :  No  Physical Activity: Not on file  Stress: Not on file  Social Connections: Not on file  Intimate Partner Violence: Not on file    FAMILY HISTORY: Family History  Problem Relation Age of Onset   Cancer Mother        skin   Hyperlipidemia Mother    Heart disease Father    Colon polyps Father    Miscarriages / Stillbirths Sister    Diabetes Maternal Grandmother    Cancer Maternal Grandmother        breast   Osteoporosis Maternal Grandmother    Glaucoma Maternal Grandmother    Vision loss Maternal Grandmother    Breast cancer Maternal Grandmother    Dementia Maternal Grandmother    Heart  disease Maternal Grandfather    Hypertension Maternal Grandfather    Stroke Maternal Grandfather    Dementia Paternal Grandmother    Heart disease Paternal Grandfather    Colon cancer Maternal Uncle    Colon cancer Paternal Aunt     ALLERGIES:  is allergic to ciprofloxacin, neomycin-bacitracin zn-polymyx, bactrim [sulfamethoxazole-trimethoprim], fluconazole , oxycodone , amoxicillin, miconazole nitrate, and penicillins.  MEDICATIONS:  Current Outpatient Medications  Medication Sig Dispense Refill   acetaminophen  (TYLENOL ) 500 MG tablet Take 1,000 mg by mouth every 6 (six) hours as needed for moderate pain.     ALPRAZolam (XANAX) 0.5 MG tablet Take by mouth.     azithromycin  (ZITHROMAX  Z-PAK) 250 MG tablet Take 2 pills by mouth for 1st day and 1 pill by mouth every day thereafter for a total of 5 days. (Patient not taking: Reported on 04/03/2024) 6 each 0   buPROPion  (WELLBUTRIN  XL) 150 MG 24 hr tablet Take 1 tablet (150 mg total) by mouth daily. 90 tablet 3   butalbital -acetaminophen -caffeine  (FIORICET , ESGIC ) 50-325-40 MG per tablet Take 2 tablets by mouth every 6 (six) hours as needed for headache. 30 tablet 1   cholestyramine light (PREVALITE) 4 GM/DOSE powder Take 4 g by mouth daily. (Patient not taking: Reported on 04/03/2024)     cyanocobalamin (VITAMIN B12) 1000 MCG/ML injection Inject 1,000 mcg into the muscle every 14 (fourteen) days.     escitalopram  (LEXAPRO ) 5 MG tablet Take 1 tablet (5 mg total) by mouth daily. (Patient not taking: Reported on 04/03/2024) 30 tablet 2   famotidine (PEPCID) 20 MG tablet Take 20 mg by mouth 2 (two) times daily.     fluticasone (FLONASE) 50 MCG/ACT nasal spray Place into both nostrils daily.     hydrocortisone  (ANUSOL -HC) 2.5 % rectal cream Place 1 Application rectally 2 (two) times daily. (Patient not taking: Reported on 04/03/2024) 30 g 0   hydrocortisone  (ANUSOL -HC) 25 MG suppository Place 1 suppository (25 mg total) rectally 2 (two) times daily.  (Patient not taking: Reported on 04/03/2024) 12 suppository 0   nystatin  cream (MYCOSTATIN ) Apply topically.     nystatin -triamcinolone  ointment (MYCOLOG) Apply 1 Application topically 2 (two) times daily. (Patient not taking: Reported on 04/03/2024) 30 g 1   ondansetron  (ZOFRAN -ODT) 8 MG disintegrating tablet Take 8 mg by mouth every 8 (eight) hours as needed for nausea or vomiting.     pantoprazole (PROTONIX) 40 MG tablet Take 40 mg by mouth daily.     promethazine  (PHENERGAN ) 25 MG suppository Place 25 mg rectally every 6 (six) hours as needed for nausea or vomiting. (Patient not taking: Reported on 04/03/2024)     rivaroxaban  (XARELTO ) 20 MG TABS tablet Take 1 tablet (20 mg total) by mouth daily. 90 tablet 2   scopolamine  (  TRANSDERM-SCOP) 1 MG/3DAYS Apply 1 patch topically.     SUMAtriptan (IMITREX) 50 MG tablet Take 50 mg by mouth every 2 (two) hours as needed for migraine.     SYRINGE-NEEDLE, DISP, 3 ML 25G X 5/8 3 ML MISC 1,000 mcg by Misc.(Non-Drug; Combo Route) route every 30 days.     Vitamin D, Ergocalciferol, (DRISDOL) 1.25 MG (50000 UT) CAPS capsule Take 50,000 Units by mouth every 7 (seven) days.     Current Facility-Administered Medications  Medication Dose Route Frequency Provider Last Rate Last Admin   levonorgestrel  (MIRENA ) 20 MCG/DAY IUD 1 each  1 each Intrauterine Once Chrzanowski, Jami B, NP        REVIEW OF SYSTEMS:   Constitutional: ( - ) fevers, ( - )  chills , ( - ) night sweats Eyes: ( - ) blurriness of vision, ( - ) double vision, ( - ) watery eyes Ears, nose, mouth, throat, and face: ( - ) mucositis, ( - ) sore throat Respiratory: ( - ) cough, ( - ) dyspnea, ( - ) wheezes Cardiovascular: ( - ) palpitation, ( - ) chest discomfort, ( - ) lower extremity swelling Gastrointestinal:  ( - ) nausea, ( - ) heartburn, ( - ) change in bowel habits Skin: ( - ) abnormal skin rashes Lymphatics: ( - ) new lymphadenopathy, ( - ) easy bruising Neurological: ( - ) numbness, ( - )  tingling, ( - ) new weaknesses Behavioral/Psych: ( - ) mood change, ( - ) new changes  All other systems were reviewed with the patient and are negative.  PHYSICAL EXAMINATION:  There were no vitals filed for this visit.    There were no vitals filed for this visit.    GENERAL: Well-appearing middle-aged Caucasian female, alert, no distress and comfortable SKIN: skin color, texture, turgor are normal, no rashes or significant lesions EYES: conjunctiva are pink and non-injected, sclera clear LUNGS: clear to auscultation and percussion with normal breathing effort HEART: regular rate & rhythm and no murmurs and no lower extremity edema Musculoskeletal: no cyanosis of digits and no clubbing  PSYCH: alert & oriented x 3, fluent speech NEURO: no focal motor/sensory deficits  LABORATORY DATA:  I have reviewed the data as listed    Latest Ref Rng & Units 03/17/2024    4:00 PM 11/06/2023   11:55 AM 09/11/2023    3:41 PM  CBC  WBC 4.0 - 10.5 K/uL 7.6  7.8  7.8   Hemoglobin 12.0 - 15.0 g/dL 86.1  86.9  86.4   Hematocrit 36.0 - 46.0 % 41.1  39.1  41.2   Platelets 150 - 400 K/uL 280  275  302        Latest Ref Rng & Units 03/17/2024    4:00 PM 11/06/2023   11:55 AM 09/11/2023    3:41 PM  CMP  Glucose 70 - 99 mg/dL 92  76  90   BUN 6 - 20 mg/dL 13  11  11    Creatinine 0.44 - 1.00 mg/dL 9.07  9.05  9.16   Sodium 135 - 145 mmol/L 138  139  139   Potassium 3.5 - 5.1 mmol/L 3.9  3.6  3.9   Chloride 98 - 111 mmol/L 102  105  105   CO2 22 - 32 mmol/L 31  28  30    Calcium 8.9 - 10.3 mg/dL 9.3  9.1  9.1   Total Protein 6.5 - 8.1 g/dL 7.2  6.8  6.7  Total Bilirubin 0.0 - 1.2 mg/dL 0.4  0.5  0.5   Alkaline Phos 38 - 126 U/L 100  93  88   AST 15 - 41 U/L 13  11  13    ALT 0 - 44 U/L 14  11  13      RADIOGRAPHIC STUDIES: No results found.  ASSESSMENT & PLAN Robin Key is a 38 y.o.  female with medical history significant for iron deficiency anemia and VTE in the setting of heterozygous  factor V Leiden who presents for a follow up visit.  # Iron Deficiency Anemia 2/2 to GYN Bleeding -- Findings are consistent with iron deficiency anemia secondary to patient's menstrual cycles which are now under better control. --Patient currently has an IUD in place. --Labs today show *** -- Patient not currently taking any iron supplementation --continue to monitor.  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  She was on full dose anticoagulation with Lovenox  after C-section on 07/02/2023 -- Patient was started on Xarelto  therapy after she developed a superficial thrombophlebitis in January 2015 -- Xarelto  20 mg p.o. daily was started in June 2019 due to superficial thrombophlebitis.  She has consistently been on this medication since December 2020 Plan: -- Continue Xarelto  20 mg p.o. daily. -- Patient is not having any financial toxicity with the medication and does not have any signs or symptoms concerning for recurrent VTE.  Additionally patient not having any bleeding -- Labs today show *** -- Plan to have patient return to clinic in 6 months  No orders of the defined types were placed in this encounter.   All questions were answered. The patient knows to call the clinic with any problems, questions or concerns.  I have spent a total of 30 minutes minutes of face-to-face and non-face-to-face time, preparing to see the patient, performing a medically appropriate examination, counseling and educating the patient, documenting clinical information in the electronic health record, independently interpreting results and communicating results to the patient, and care coordination.   Johnston Police PA-C Dept of Hematology and Oncology [facility] at [facility] Phone: [phone]   05/08/2024 10:27 PM

## 2024-05-09 ENCOUNTER — Ambulatory Visit: Admitting: Hematology and Oncology

## 2024-05-09 ENCOUNTER — Other Ambulatory Visit

## 2024-05-09 ENCOUNTER — Inpatient Hospital Stay: Payer: Self-pay | Admitting: Physician Assistant

## 2024-05-09 ENCOUNTER — Inpatient Hospital Stay: Payer: Self-pay | Attending: Hematology and Oncology

## 2024-06-04 ENCOUNTER — Inpatient Hospital Stay: Attending: Hematology and Oncology

## 2024-06-04 ENCOUNTER — Telehealth: Payer: Self-pay | Admitting: Physician Assistant

## 2024-06-04 ENCOUNTER — Inpatient Hospital Stay: Admitting: Physician Assistant

## 2024-06-16 ENCOUNTER — Other Ambulatory Visit: Payer: Self-pay | Admitting: *Deleted

## 2024-06-16 ENCOUNTER — Other Ambulatory Visit (HOSPITAL_COMMUNITY): Payer: Self-pay

## 2024-06-16 ENCOUNTER — Other Ambulatory Visit: Payer: Self-pay

## 2024-06-16 ENCOUNTER — Telehealth: Payer: Self-pay | Admitting: *Deleted

## 2024-06-16 ENCOUNTER — Encounter: Payer: Self-pay | Admitting: Hematology and Oncology

## 2024-06-16 ENCOUNTER — Ambulatory Visit (HOSPITAL_COMMUNITY)
Admission: RE | Admit: 2024-06-16 | Discharge: 2024-06-16 | Disposition: A | Discharge status: Home / Self Care | Source: Ambulatory Visit | Attending: Hematology and Oncology | Admitting: Hematology and Oncology

## 2024-06-16 DIAGNOSIS — D6851 Activated protein C resistance: Secondary | ICD-10-CM

## 2024-06-16 DIAGNOSIS — Z86718 Personal history of other venous thrombosis and embolism: Secondary | ICD-10-CM | POA: Insufficient documentation

## 2024-06-16 MED ORDER — ENOXAPARIN SODIUM 120 MG/0.8ML IJ SOSY
120.0000 mg | PREFILLED_SYRINGE | Freq: Two times a day (BID) | INTRAMUSCULAR | 0 refills | Status: DC
  Filled 2024-06-16: qty 11.2, 7d supply, fill #0

## 2024-06-16 NOTE — Telephone Encounter (Signed)
 Received call from pt. She states she drove her children to the beach this weekend and developed a possible superficial clot in her left leg. She has sent a picture of this via My Chart. She was seen in the ED at [facility]. They did not do an US   of her leg. They did order L:Lovenox  injections for 6 days. Her 1st does of this was yesterday in the ED. She has a hard time getting this filled at her local pharmacy. She is asking if she should get an US  of her leg and if she should continue the lovenox . Discussed with Dr. Federico. He has ordered an US  and given the ok to continue the lovenox  for now. Pt has an appt on Friday with Irene Thayil, PA-C  Lovenox  order sent to [facility] pharmacy for pt to pick up today. Pt advised of the above and that she will get a call to schedule the US  of her leg.  Pt voiced  understanding.SABRA

## 2024-06-16 NOTE — Telephone Encounter (Signed)
 See previous note

## 2024-06-18 NOTE — Telephone Encounter (Signed)
 No response from patient.  No future appts scheduled.   Please advise.

## 2024-06-18 NOTE — Telephone Encounter (Signed)
 Letter also sent via MyChart.   Encounter closed.

## 2024-06-18 NOTE — Telephone Encounter (Signed)
 Letter signed and given to front desk to mail.

## 2024-06-18 NOTE — Progress Notes (Signed)
Pt is here today for labs.

## 2024-06-18 NOTE — Telephone Encounter (Signed)
 Letter pended. Copy to Jami to review and sign to be mailed.

## 2024-06-18 NOTE — Telephone Encounter (Signed)
 Please send letter.

## 2024-06-20 ENCOUNTER — Other Ambulatory Visit: Payer: Self-pay

## 2024-06-20 ENCOUNTER — Inpatient Hospital Stay: Admitting: Physician Assistant

## 2024-06-20 ENCOUNTER — Ambulatory Visit: Payer: Self-pay | Admitting: Physician Assistant

## 2024-06-20 ENCOUNTER — Other Ambulatory Visit (HOSPITAL_COMMUNITY): Payer: Self-pay

## 2024-06-20 ENCOUNTER — Inpatient Hospital Stay: Attending: Hematology and Oncology

## 2024-06-20 ENCOUNTER — Encounter: Payer: Self-pay | Admitting: Hematology and Oncology

## 2024-06-20 VITALS — BP 111/79 | HR 71 | Temp 97.6°F | Resp 17 | Ht 69.0 in | Wt 256.0 lb

## 2024-06-20 DIAGNOSIS — R109 Unspecified abdominal pain: Secondary | ICD-10-CM | POA: Diagnosis not present

## 2024-06-20 DIAGNOSIS — D500 Iron deficiency anemia secondary to blood loss (chronic): Secondary | ICD-10-CM | POA: Insufficient documentation

## 2024-06-20 DIAGNOSIS — Z86718 Personal history of other venous thrombosis and embolism: Secondary | ICD-10-CM

## 2024-06-20 DIAGNOSIS — N920 Excessive and frequent menstruation with regular cycle: Secondary | ICD-10-CM | POA: Insufficient documentation

## 2024-06-20 DIAGNOSIS — Z7901 Long term (current) use of anticoagulants: Secondary | ICD-10-CM | POA: Insufficient documentation

## 2024-06-20 DIAGNOSIS — Z975 Presence of (intrauterine) contraceptive device: Secondary | ICD-10-CM | POA: Diagnosis not present

## 2024-06-20 DIAGNOSIS — D6851 Activated protein C resistance: Secondary | ICD-10-CM | POA: Insufficient documentation

## 2024-06-20 DIAGNOSIS — R748 Abnormal levels of other serum enzymes: Secondary | ICD-10-CM

## 2024-06-20 LAB — CMP (CANCER CENTER ONLY)
ALT: 87 U/L — ABNORMAL HIGH (ref 0–44)
AST: 49 U/L — ABNORMAL HIGH (ref 15–41)
Albumin: 4.4 g/dL (ref 3.5–5.0)
Alkaline Phosphatase: 101 U/L (ref 38–126)
Anion gap: 6 (ref 5–15)
BUN: 9 mg/dL (ref 6–20)
CO2: 29 mmol/L (ref 22–32)
Calcium: 9.2 mg/dL (ref 8.9–10.3)
Chloride: 104 mmol/L (ref 98–111)
Creatinine: 1.01 mg/dL — ABNORMAL HIGH (ref 0.44–1.00)
GFR, Estimated: >60 mL/min (ref >60)
Glucose, Bld: 77 mg/dL (ref 70–99)
Potassium: 3.9 mmol/L (ref 3.5–5.1)
Sodium: 139 mmol/L (ref 135–145)
Total Bilirubin: 0.4 mg/dL (ref 0.0–1.2)
Total Protein: 6.9 g/dL (ref 6.5–8.1)

## 2024-06-20 LAB — CBC WITH DIFFERENTIAL (CANCER CENTER ONLY)
Abs Immature Granulocytes: 0.01 K/uL (ref 0.00–0.07)
Basophils Absolute: 0.1 K/uL (ref 0.0–0.1)
Basophils Relative: 1 %
Eosinophils Absolute: 0.2 K/uL (ref 0.0–0.5)
Eosinophils Relative: 3 %
HCT: 40.6 % (ref 36.0–46.0)
Hemoglobin: 13.6 g/dL (ref 12.0–15.0)
Immature Granulocytes: 0 %
Lymphocytes Relative: 19 %
Lymphs Abs: 1.3 K/uL (ref 0.7–4.0)
MCH: 29.1 pg (ref 26.0–34.0)
MCHC: 33.5 g/dL (ref 30.0–36.0)
MCV: 86.9 fL (ref 80.0–100.0)
Monocytes Absolute: 0.6 K/uL (ref 0.1–1.0)
Monocytes Relative: 9 %
Neutro Abs: 4.6 K/uL (ref 1.7–7.7)
Neutrophils Relative %: 68 %
Platelet Count: 258 K/uL (ref 150–400)
RBC: 4.67 MIL/uL (ref 3.87–5.11)
RDW: 12.9 % (ref 11.5–15.5)
WBC Count: 6.8 K/uL (ref 4.0–10.5)
nRBC: 0 % (ref 0.0–0.2)

## 2024-06-20 LAB — IRON AND IRON BINDING CAPACITY (CC-WL,HP ONLY)
Iron: 74 ug/dL (ref 28–170)
Saturation Ratios: 25 % (ref 10.4–31.8)
TIBC: 293 ug/dL (ref 250–450)
UIBC: 219 ug/dL (ref 148–442)

## 2024-06-20 LAB — D-DIMER, QUANTITATIVE: D-Dimer, Quant: 0.58 ug{FEU}/mL — ABNORMAL HIGH (ref 0.00–0.50)

## 2024-06-20 LAB — FERRITIN: Ferritin: 86 ng/mL (ref 11–307)

## 2024-06-20 MED ORDER — ENOXAPARIN SODIUM 120 MG/0.8ML IJ SOSY
120.0000 mg | PREFILLED_SYRINGE | Freq: Two times a day (BID) | INTRAMUSCULAR | 2 refills | Status: DC
  Filled 2024-06-20 (×3): qty 48, 30d supply, fill #0
  Filled 2024-06-21 (×2): qty 40, 25d supply, fill #0
  Filled 2024-06-21: qty 8, 5d supply, fill #0

## 2024-06-20 MED ORDER — CHOLESTYRAMINE LIGHT 4 GM/DOSE PO POWD: 4.0000 g | Freq: Every day | ORAL | 2 refills | Status: DC

## 2024-06-20 MED ORDER — CHOLESTYRAMINE LIGHT 4 G PO PACK
4.0000 g | PACK | Freq: Every day | ORAL | 2 refills | Status: AC
  Filled 2024-06-20 (×2): qty 60, 60d supply, fill #0

## 2024-06-20 MED ORDER — ENOXAPARIN SODIUM 120 MG/0.8ML IJ SOSY: 120.0000 mg | PREFILLED_SYRINGE | Freq: Two times a day (BID) | INTRAMUSCULAR | 2 refills | Status: DC

## 2024-06-20 NOTE — Progress Notes (Signed)
 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 Amadeo 11/17/2022: transition care to Dr. Federico   Interval History:  Robin Key 38 y.o. 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 The patient's last visit was on 11/06/2023 with Dr. Federico. In the interim, she presented to ER on 06/15/2024 due to unilateral leg swelling after prolonged travel while not able to tolerate Xarelto  due to nausea/vomiting. She underwent US  of lower extremity on 06/16/2024 that showed a SVT of the great saphenous vein. She was started on Lovenox  1 mg/kg since then.   On exam today Robin Key reports since starting Lovenox  therapy the swelling in the leg is improving. She is tolerating Lovenox  injections without any overt signs of bleeding. She adds that she is having vomiting and multiple episodes of diarrhea since her prescriptions for cholestyramine, pepcid and protonix ran out. She is looking to transfer care to another GI specialist. She reports right sided flank pain that is persistent with trigger unknown. She adds that her Xarelto  pills were not absorbed due to recurrent episodes of vomiting. She denies fevers, chills, sweats, chest pain or cough.  Full 10 point ROS is otherwise negative.  MEDICAL HISTORY:  Past Medical History:  Diagnosis Date   Abnormal Pap smear of cervix    Blood dyscrasia    factor 5   DVT (deep vein thrombosis) in pregnancy    Factor V Leiden    Headache(784.0)    Infection    UTI   PCOS (polycystic ovarian syndrome)    PONV (postoperative nausea and vomiting)    Pulmonary  embolism (HCC) 06/08/2011   Varicose veins     SURGICAL HISTORY: Past Surgical History:  Procedure Laterality Date   BIOPSY  06/27/2022   Procedure: BIOPSY;  Surgeon: Rosalie Kitchens, MD;  Location: [facility];  Service: Gastroenterology;;   bladder stem stretched     CESAREAN SECTION N/A 07/01/2013   Procedure: CESAREAN SECTION;  Surgeon: Lynwood FORBES Curlene PONCE, MD;  Location: [facility];  Service: Obstetrics;  Laterality: N/A;  Primary edc 07/11/13   COLONOSCOPY WITH PROPOFOL  N/A 06/27/2022   Procedure: COLONOSCOPY WITH PROPOFOL ;  Surgeon: Rosalie Kitchens, MD;  Location: [facility];  Service: Gastroenterology;  Laterality: N/A;   ESOPHAGOGASTRODUODENOSCOPY (EGD) WITH PROPOFOL  N/A 06/27/2022   Procedure: ESOPHAGOGASTRODUODENOSCOPY (EGD) WITH PROPOFOL ;  Surgeon: Rosalie Kitchens, MD;  Location: [facility];  Service: Gastroenterology;  Laterality: N/A;    SOCIAL HISTORY: Social History   Socioeconomic History   Marital status: Married    Spouse name: Not on file   Number of children: Not on file   Years of education: Not on file   Highest education level: Not on file  Occupational History   Not on file  Tobacco Use   Smoking status: Never    Passive exposure: Never   Smokeless tobacco: Never  Substance and Sexual Activity   Alcohol use: No   Drug use: No   Sexual activity: Yes    Partners: Male    Birth control/protection: I.U.D.    Comment: menarche 38yo, sexual debut as a teen  Other Topics Concern   Not on file  Social History Narrative   Not on file   Social Drivers of Health   Financial Resource Strain: Not on file  Food Insecurity: Low Risk  (05/07/2024)   Received from [facility]   Hunger Vital Sign    Within the past 12 months, you worried that your food would run out before you got money to buy more: Never true    Within the past 12 months, the food you bought just didn't last and you didn't have money to get more. : Never true  Transportation Needs: No Transportation  Needs (05/07/2024)   Received from [facility]    In the past 12 months, has lack of reliable transportation kept you from medical appointments, meetings, work or from getting things needed for daily living? : No  Physical Activity: Not on file  Stress: Not on file  Social Connections: Not on file  Intimate Partner Violence: Not on file    FAMILY HISTORY: Family History  Problem Relation Age of Onset   Cancer Mother        skin   Hyperlipidemia Mother    Heart disease Father    Colon polyps Father    Miscarriages / Stillbirths Sister    Diabetes Maternal Grandmother    Cancer Maternal Grandmother        breast   Osteoporosis Maternal Grandmother    Glaucoma Maternal Grandmother    Vision loss Maternal Grandmother    Breast cancer Maternal Grandmother    Dementia Maternal Grandmother    Heart disease Maternal Grandfather    Hypertension Maternal Grandfather    Stroke Maternal Grandfather    Dementia Paternal Grandmother    Heart disease Paternal Grandfather    Colon cancer Maternal Uncle    Colon cancer Paternal Aunt     ALLERGIES:  is allergic to ciprofloxacin, neomycin-bacitracin zn-polymyx, bactrim [sulfamethoxazole-trimethoprim], fluconazole , oxycodone , amoxicillin, miconazole nitrate, and penicillins.  MEDICATIONS:  Current Outpatient Medications  Medication Sig Dispense Refill   acetaminophen  (TYLENOL ) 500 MG tablet Take 1,000 mg by mouth every 6 (six) hours as needed for moderate pain.     ALPRAZolam (XANAX) 0.5 MG tablet Take by mouth. (Patient taking differently: Take 0.5 mg by mouth as needed.)     buPROPion  (WELLBUTRIN  XL) 300 MG 24 hr tablet Take 300 mg by mouth daily.     butalbital -acetaminophen -caffeine  (FIORICET , ESGIC ) 50-325-40 MG per tablet Take 2 tablets by mouth every 6 (six) hours as needed for headache. 30 tablet 1   cyanocobalamin (VITAMIN B12) 1000 MCG/ML injection Inject 1,000 mcg into the muscle every 14 (fourteen) days.      famotidine (PEPCID) 20 MG tablet Take 20 mg by mouth 2 (two) times daily.     fluticasone (FLONASE) 50 MCG/ACT nasal spray Place into both nostrils daily.     hydrocortisone  (ANUSOL -HC) 2.5 % rectal cream Place 1 Application rectally 2 (two) times daily. 30 g 0   hydrocortisone  (ANUSOL -HC) 25 MG suppository Place 1 suppository (25 mg total) rectally 2 (two) times daily. 12 suppository 0   nystatin  cream (MYCOSTATIN ) Apply topically.     nystatin -triamcinolone  ointment (MYCOLOG) Apply 1 Application topically 2 (two) times daily. 30 g 1   ondansetron  (ZOFRAN -ODT) 8 MG disintegrating tablet Take 8 mg by mouth every 8 (eight) hours as needed for nausea or vomiting.     pantoprazole (PROTONIX) 40 MG tablet Take 40 mg by mouth daily.     promethazine  (PHENERGAN ) 25 MG suppository Place 25 mg rectally every 6 (six)  hours as needed for nausea or vomiting.     scopolamine  (TRANSDERM-SCOP) 1 MG/3DAYS Apply 1 patch topically.     SUMAtriptan (IMITREX) 50 MG tablet Take 50 mg by mouth every 2 (two) hours as needed for migraine.     SYRINGE-NEEDLE, DISP, 3 ML 25G X 5/8 3 ML MISC 1,000 mcg by Misc.(Non-Drug; Combo Route) route every 30 days.     Vitamin D, Ergocalciferol, (DRISDOL) 1.25 MG (50000 UT) CAPS capsule Take 50,000 Units by mouth every 7 (seven) days.     azithromycin  (ZITHROMAX  Z-PAK) 250 MG tablet Take 2 pills by mouth for 1st day and 1 pill by mouth every day thereafter for a total of 5 days. (Patient not taking: Reported on 04/03/2024) 6 each 0   cholestyramine light (PREVALITE) 4 g packet Take 1 packet (4 g total) by mouth daily. 60 each 2   enoxaparin  (LOVENOX ) 120 MG/0.8ML injection Inject 0.8 mLs (120 mg total) into the skin every 12 (twelve) hours. 48 mL 2   escitalopram  (LEXAPRO ) 5 MG tablet Take 1 tablet (5 mg total) by mouth daily. (Patient not taking: Reported on 04/03/2024) 30 tablet 2   Current Facility-Administered Medications  Medication Dose Route Frequency Provider Last Rate Last  Admin   levonorgestrel  (MIRENA ) 20 MCG/DAY IUD 1 each  1 each Intrauterine Once Chrzanowski, Jami B, NP        REVIEW OF SYSTEMS:   Constitutional: ( - ) fevers, ( - )  chills , ( - ) night sweats Eyes: ( - ) blurriness of vision, ( - ) double vision, ( - ) watery eyes Ears, nose, mouth, throat, and face: ( - ) mucositis, ( - ) sore throat Respiratory: ( - ) cough, ( - ) dyspnea, ( - ) wheezes Cardiovascular: ( - ) palpitation, ( - ) chest discomfort, ( - ) lower extremity swelling Gastrointestinal:  ( + ) nausea, ( - ) heartburn, ( +) change in bowel habits Skin: ( - ) abnormal skin rashes Lymphatics: ( - ) new lymphadenopathy, ( - ) easy bruising Neurological: ( - ) numbness, ( - ) tingling, ( - ) new weaknesses Behavioral/Psych: ( - ) mood change, ( - ) new changes  All other systems were reviewed with the patient and are negative.  PHYSICAL EXAMINATION:  Vitals:   06/20/24 1211  BP: 111/79  Pulse: 71  Resp: 17  Temp: 97.6 F (36.4 C)  SpO2: 100%     Filed Weights   06/20/24 1211  Weight: 256 lb (116.1 kg)     GENERAL: Well-appearing middle-aged Caucasian female, alert, no distress and comfortable SKIN: skin color, texture, turgor are normal, no rashes or significant lesions EYES: conjunctiva are pink and non-injected, sclera clear LUNGS: clear to auscultation and percussion with normal breathing effort HEART: regular rate & rhythm and no murmurs. Bilateral but L > R lower extremity edema with varicosities.  Musculoskeletal: no cyanosis of digits and no clubbing  PSYCH: alert & oriented x 3, fluent speech NEURO: no focal motor/sensory deficits  LABORATORY DATA:  I have reviewed the data as listed    Latest Ref Rng & Units 06/20/2024   11:23 AM 03/17/2024    4:00 PM 11/06/2023   11:55 AM  CBC  WBC 4.0 - 10.5 K/uL 6.8  7.6  7.8   Hemoglobin 12.0 - 15.0 g/dL 86.3  86.1  86.9   Hematocrit 36.0 - 46.0 % 40.6  41.1  39.1   Platelets 150 - 400 K/uL 258  280  275         Latest Ref Rng & Units 06/20/2024   11:23 AM 03/17/2024    4:00 PM 11/06/2023   11:55 AM  CMP  Glucose 70 - 99 mg/dL 77  92  76   BUN 6 - 20 mg/dL 9  13  11    Creatinine 0.44 - 1.00 mg/dL 8.98  9.07  9.05   Sodium 135 - 145 mmol/L 139  138  139   Potassium 3.5 - 5.1 mmol/L 3.9  3.9  3.6   Chloride 98 - 111 mmol/L 104  102  105   CO2 22 - 32 mmol/L 29  31  28    Calcium 8.9 - 10.3 mg/dL 9.2  9.3  9.1   Total Protein 6.5 - 8.1 g/dL 6.9  7.2  6.8   Total Bilirubin 0.0 - 1.2 mg/dL 0.4  0.4  0.5   Alkaline Phos 38 - 126 U/L 101  100  93   AST 15 - 41 U/L 49  13  11   ALT 0 - 44 U/L 87  14  11     RADIOGRAPHIC STUDIES: VAS US  LOWER EXTREMITY VENOUS (DVT) Result Date: 06/16/2024  Lower Venous DVT Study Patient Name:  ZOEIE RITTER  Date of Exam:   06/16/2024 Medical Rec #: [id number]        Accession #:    [id number] Date of Birth: 08-11-1985        Patient Gender: F Patient Age:   89 years Exam Location:  [address] Procedure:      VAS US  LOWER EXTREMITY VENOUS (DVT) Referring Phys: NORLEEN DORSEY --------------------------------------------------------------------------------  Risk Factors: Factor V Leiden recent extended travel. Anticoagulation: On chronic Xarelto . It was stopped and Lovenox  started when patient presented to ED. Comparison Study: 08/03/21: Left GSV thrombus                   08/25/13: Left calf varicosity thrombus.                   06/08/11: PE Performing Technologist: [place] RVT  Examination Guidelines: A complete evaluation includes B-mode imaging, spectral Doppler, color Doppler, and power Doppler as needed of all accessible portions of each vessel. Bilateral testing is considered an integral part of a complete examination. Limited examinations for reoccurring indications may be performed as noted. The reflux portion of the exam is performed with the patient in reverse Trendelenburg.  +-----+---------------+---------+-----------+----------+--------------+  RIGHTCompressibilityPhasicitySpontaneityPropertiesThrombus Aging +-----+---------------+---------+-----------+----------+--------------+ CFV  Full           Yes      Yes                                 +-----+---------------+---------+-----------+----------+--------------+ SFJ  Full                    Yes                                 +-----+---------------+---------+-----------+----------+--------------+   +---------+---------------+---------+-----------+----------+--------------+ LEFT     CompressibilityPhasicitySpontaneityPropertiesThrombus Aging +---------+---------------+---------+-----------+----------+--------------+ CFV      Full           Yes      Yes                                 +---------+---------------+---------+-----------+----------+--------------+  SFJ      Full                    Yes                                 +---------+---------------+---------+-----------+----------+--------------+ FV Prox  Full           Yes      Yes                                 +---------+---------------+---------+-----------+----------+--------------+ FV Mid   Full           Yes      Yes                                 +---------+---------------+---------+-----------+----------+--------------+ FV DistalFull           Yes      Yes                                 +---------+---------------+---------+-----------+----------+--------------+ POP      Full           Yes      Yes                                 +---------+---------------+---------+-----------+----------+--------------+ PTV      Full                    Yes                                 +---------+---------------+---------+-----------+----------+--------------+ PERO     Full                    Yes                                 +---------+---------------+---------+-----------+----------+--------------+ GSV      Partial                                      Acute           +---------+---------------+---------+-----------+----------+--------------+ GSV thrombus from above knee to below knee.  Findings reported to Preliminary routed via Epic to PCP at 3:37 pm.  Summary: RIGHT: - No evidence of common femoral vein obstruction.  LEFT: - There is no evidence of deep vein thrombosis in the lower extremity. - GSV thrombus from above the knee to below the knee.  *See table(s) above for measurements and observations. Electronically signed by Gaile New MD on 06/16/2024 at 3:52:36 PM.    Final     ASSESSMENT & PLAN Robin Key is a 38 y.o.  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  # Iron Deficiency Anemia 2/2 to GYN Bleeding -- Findings are consistent with iron deficiency anemia secondary to patient's menstrual cycles which are now  under better control. --Patient currently has an IUD in place. --Labs today show no evidence of anemia with Hgb 13.6, MCV 86.9. Iron panel shows no deficiency with ferritin 86, saturation 25%.   -- Patient not currently taking any iron supplementation. No need for IV iron at this time.  --continue to monitor.  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  She was on full dose anticoagulation with Lovenox  after C-section on 07/02/2023 -- Patient was started on Xarelto  therapy after she developed a superficial thrombophlebitis in January 2015 -- Xarelto  20 mg p.o. daily was started in June 2019 due to superficial thrombophlebitis.   Plan: -- Switched from Xarelto  to Lovenox  1 mg/kg after recent diagnosis of thrombosis on GSV. Felt to be secondary to difficulty with absorption of Xarelto  due to recurrent vomiting rather than Xarelto  failure. Advised to continue with Lovenox  therapy at this time and refill sent to pharmacy.  -- Labs today show no cytopenias. D-dimer  levels are improving since recent VTE diagnosis.  --Referral to vascular surgery to address varicosities.  -- Will consider switching back to Xarelto  if GI symptoms improve. Follow up in 6 weeks to discuss.   #Recurrent vomiting/diarrhea: --Refilled patient's cholestyramine prescription. Patient will follow up with PCP to refill protonix and pepcid prescription.  --Referral sent to [facility] to transfer care and to further evaluate ongoing symptoms.  --Encouraged to stay hydrated drinking 1-2 L of water.   #Right sided abdominal pain: #Elevated liver enzymes: --Can be related to previous Xarelto  therapy but patient is also c/o ongoing right sided abdominal pain --Will obtain Liver US  to further evaluate --Repeat labs in 4 weeks   Orders Placed This Encounter  Procedures   US  Abdomen Limited RUQ (LIVER/GB)    Standing Status:   Future    Expected Date:   06/27/2024    Expiration Date:   06/20/2025    Reason for Exam (SYMPTOM  OR DIAGNOSIS REQUIRED):   right sided abdominal pain, elevated liver enzymes    Preferred imaging location?:   [facility]   D-dimer, quantitative    Standing Status:   Future    Number of Occurrences:   1    Expiration Date:   06/20/2025   Ambulatory referral to Gastroenterology    Referral Priority:   Urgent    Referral Type:   Consultation    Referral Reason:   Specialty Services Required    Number of Visits Requested:   1   Ambulatory referral to Vascular Surgery    Referral Priority:   Routine    Referral Type:   Surgical    Referral Reason:   Specialty Services Required    Requested Specialty:   Vascular Surgery    Number of Visits Requested:   1    All questions were answered. The patient knows to call the clinic with any problems, questions or concerns.   I have spent a total of 30 minutes minutes of face-to-face and non-face-to-face time, preparing to see the patient, performing a medically appropriate examination, counseling and  educating the patient, ordering medications/tests/procedures, referring with other health care professionals, documenting clinical information in the electronic health record, independently interpreting results and communicating results to the patient, and care coordination.   Johnston Police PA-C Dept of Hematology and Oncology [facility] Phone: [phone]   06/20/2024 5:00 PM

## 2024-06-21 ENCOUNTER — Other Ambulatory Visit (HOSPITAL_COMMUNITY): Payer: Self-pay

## 2024-06-27 ENCOUNTER — Ambulatory Visit (INDEPENDENT_AMBULATORY_CARE_PROVIDER_SITE_OTHER)
Admission: RE | Admit: 2024-06-27 | Discharge: 2024-06-27 | Disposition: A | Discharge status: Home / Self Care | Source: Ambulatory Visit | Attending: Physician Assistant | Admitting: Physician Assistant

## 2024-06-27 DIAGNOSIS — R1011 Right upper quadrant pain: Secondary | ICD-10-CM | POA: Diagnosis not present

## 2024-06-27 DIAGNOSIS — R748 Abnormal levels of other serum enzymes: Secondary | ICD-10-CM | POA: Diagnosis not present

## 2024-06-27 DIAGNOSIS — R109 Unspecified abdominal pain: Secondary | ICD-10-CM

## 2024-06-30 ENCOUNTER — Other Ambulatory Visit: Payer: Self-pay | Admitting: Physician Assistant

## 2024-06-30 ENCOUNTER — Telehealth: Payer: Self-pay | Admitting: Physician Assistant

## 2024-06-30 DIAGNOSIS — Z86718 Personal history of other venous thrombosis and embolism: Secondary | ICD-10-CM

## 2024-06-30 NOTE — Telephone Encounter (Signed)
 I called [name] to review the liver US  results that showed no acute findings and possible fatty liver disease. Patient is scheduled for GI consultation in January. In addition, she returns on 12/9 to repeat her LFTs.   Patient shares that her pharmacy did not have her cholestyramine  packet until today.I advised her to follow up if her GI symptoms don't improve with medication.

## 2024-07-02 ENCOUNTER — Ambulatory Visit (HOSPITAL_COMMUNITY)

## 2024-07-15 ENCOUNTER — Ambulatory Visit: Payer: Self-pay | Admitting: Physician Assistant

## 2024-07-15 ENCOUNTER — Inpatient Hospital Stay: Attending: Hematology and Oncology

## 2024-07-15 DIAGNOSIS — N920 Excessive and frequent menstruation with regular cycle: Secondary | ICD-10-CM | POA: Diagnosis present

## 2024-07-15 DIAGNOSIS — D500 Iron deficiency anemia secondary to blood loss (chronic): Secondary | ICD-10-CM | POA: Insufficient documentation

## 2024-07-15 DIAGNOSIS — R748 Abnormal levels of other serum enzymes: Secondary | ICD-10-CM

## 2024-07-15 DIAGNOSIS — Z86718 Personal history of other venous thrombosis and embolism: Secondary | ICD-10-CM

## 2024-07-15 DIAGNOSIS — Z7901 Long term (current) use of anticoagulants: Secondary | ICD-10-CM | POA: Insufficient documentation

## 2024-07-15 DIAGNOSIS — D6851 Activated protein C resistance: Secondary | ICD-10-CM | POA: Insufficient documentation

## 2024-07-15 LAB — CBC WITH DIFFERENTIAL (CANCER CENTER ONLY)
Abs Immature Granulocytes: 0.02 K/uL (ref 0.00–0.07)
Basophils Absolute: 0 K/uL (ref 0.0–0.1)
Basophils Relative: 1 %
Eosinophils Absolute: 0.3 K/uL (ref 0.0–0.5)
Eosinophils Relative: 5 %
HCT: 41.7 % (ref 36.0–46.0)
Hemoglobin: 14 g/dL (ref 12.0–15.0)
Immature Granulocytes: 0 %
Lymphocytes Relative: 18 %
Lymphs Abs: 1.1 K/uL (ref 0.7–4.0)
MCH: 29.3 pg (ref 26.0–34.0)
MCHC: 33.6 g/dL (ref 30.0–36.0)
MCV: 87.2 fL (ref 80.0–100.0)
Monocytes Absolute: 0.4 K/uL (ref 0.1–1.0)
Monocytes Relative: 7 %
Neutro Abs: 4.2 K/uL (ref 1.7–7.7)
Neutrophils Relative %: 69 %
Platelet Count: 234 K/uL (ref 150–400)
RBC: 4.78 MIL/uL (ref 3.87–5.11)
RDW: 12.4 % (ref 11.5–15.5)
WBC Count: 6.1 K/uL (ref 4.0–10.5)
nRBC: 0 % (ref 0.0–0.2)

## 2024-07-15 LAB — CMP (CANCER CENTER ONLY)
ALT: 26 U/L (ref 0–44)
AST: 18 U/L (ref 15–41)
Albumin: 4.5 g/dL (ref 3.5–5.0)
Alkaline Phosphatase: 105 U/L (ref 38–126)
Anion gap: 9 (ref 5–15)
BUN: 7 mg/dL (ref 6–20)
CO2: 28 mmol/L (ref 22–32)
Calcium: 9.6 mg/dL (ref 8.9–10.3)
Chloride: 103 mmol/L (ref 98–111)
Creatinine: 0.86 mg/dL (ref 0.44–1.00)
GFR, Estimated: >60 mL/min (ref >60)
Glucose, Bld: 96 mg/dL (ref 70–99)
Potassium: 4.1 mmol/L (ref 3.5–5.1)
Sodium: 140 mmol/L (ref 135–145)
Total Bilirubin: 0.4 mg/dL (ref 0.0–1.2)
Total Protein: 7.3 g/dL (ref 6.5–8.1)

## 2024-07-15 LAB — D-DIMER, QUANTITATIVE: D-Dimer, Quant: <0.27 ug{FEU}/mL (ref 0.00–0.50)

## 2024-07-18 ENCOUNTER — Other Ambulatory Visit: Payer: Self-pay | Admitting: Physician Assistant

## 2024-07-18 MED ORDER — ENOXAPARIN SODIUM 120 MG/0.8ML IJ SOSY: 120.0000 mg | PREFILLED_SYRINGE | Freq: Two times a day (BID) | INTRAMUSCULAR | 2 refills | Status: AC

## 2024-07-29 ENCOUNTER — Inpatient Hospital Stay

## 2024-07-29 ENCOUNTER — Other Ambulatory Visit: Payer: Self-pay | Admitting: Hematology and Oncology

## 2024-07-29 ENCOUNTER — Inpatient Hospital Stay: Admitting: Hematology and Oncology

## 2024-07-29 DIAGNOSIS — N920 Excessive and frequent menstruation with regular cycle: Secondary | ICD-10-CM | POA: Diagnosis not present

## 2024-07-29 DIAGNOSIS — Z86718 Personal history of other venous thrombosis and embolism: Secondary | ICD-10-CM

## 2024-07-29 DIAGNOSIS — D649 Anemia, unspecified: Secondary | ICD-10-CM | POA: Diagnosis not present

## 2024-07-29 LAB — CMP (CANCER CENTER ONLY)
ALT: 17 U/L (ref 0–44)
AST: 14 U/L — ABNORMAL LOW (ref 15–41)
Albumin: 4.5 g/dL (ref 3.5–5.0)
Alkaline Phosphatase: 107 U/L (ref 38–126)
Anion gap: 9 (ref 5–15)
BUN: 7 mg/dL (ref 6–20)
CO2: 29 mmol/L (ref 22–32)
Calcium: 9.3 mg/dL (ref 8.9–10.3)
Chloride: 102 mmol/L (ref 98–111)
Creatinine: 0.94 mg/dL (ref 0.44–1.00)
GFR, Estimated: >60 mL/min
Glucose, Bld: 96 mg/dL (ref 70–99)
Potassium: 4.2 mmol/L (ref 3.5–5.1)
Sodium: 140 mmol/L (ref 135–145)
Total Bilirubin: 0.5 mg/dL (ref 0.0–1.2)
Total Protein: 6.9 g/dL (ref 6.5–8.1)

## 2024-07-29 LAB — CBC WITH DIFFERENTIAL (CANCER CENTER ONLY)
Abs Immature Granulocytes: 0.01 K/uL (ref 0.00–0.07)
Basophils Absolute: 0 K/uL (ref 0.0–0.1)
Basophils Relative: 1 %
Eosinophils Absolute: 0.3 K/uL (ref 0.0–0.5)
Eosinophils Relative: 5 %
HCT: 38.6 % (ref 36.0–46.0)
Hemoglobin: 13.1 g/dL (ref 12.0–15.0)
Immature Granulocytes: 0 %
Lymphocytes Relative: 19 %
Lymphs Abs: 1.1 K/uL (ref 0.7–4.0)
MCH: 29.6 pg (ref 26.0–34.0)
MCHC: 33.9 g/dL (ref 30.0–36.0)
MCV: 87.3 fL (ref 80.0–100.0)
Monocytes Absolute: 0.4 K/uL (ref 0.1–1.0)
Monocytes Relative: 7 %
Neutro Abs: 3.9 K/uL (ref 1.7–7.7)
Neutrophils Relative %: 68 %
Platelet Count: 252 K/uL (ref 150–400)
RBC: 4.42 MIL/uL (ref 3.87–5.11)
RDW: 12.5 % (ref 11.5–15.5)
WBC Count: 5.7 K/uL (ref 4.0–10.5)
nRBC: 0 % (ref 0.0–0.2)

## 2024-07-29 LAB — LACTATE DEHYDROGENASE: LDH: 162 U/L (ref 105–235)

## 2024-07-29 MED ORDER — BUTALBITAL-APAP-CAFFEINE 50-325-40 MG PO TABS: 2.0000 | ORAL_TABLET | Freq: Four times a day (QID) | ORAL | 1 refills | Status: AC | PRN

## 2024-07-29 MED ORDER — ONDANSETRON 8 MG PO TBDP: 8.0000 mg | ORAL_TABLET | Freq: Three times a day (TID) | ORAL | 0 refills | Status: AC | PRN

## 2024-07-29 NOTE — Progress Notes (Signed)
 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 Amadeo 11/17/2022: transition care to Dr. Federico   Interval History:  Robin Key 38 y.o. 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 The patient's last visit was on 06/20/2024. In the interim, she has had no major changes in her health.  On exam today Robin Key reports she has continued on Lovenox  therapy as she was having issues with nausea and vomiting and was unable to keep her Xarelto  down.  She reports that she is tolerating the Lovenox  well with no bleeding, bruising, or dark stools.  She does have an upcoming visit with a GI doctor in January 2026.  She is not having any diarrhea on top of the nausea and vomiting.  She reports that she is concerned there may be an issue with endometriosis causing these issues.  She also has an appointment scheduled with OB/GYN.  She is requesting a refill on her Zofran  today.  She reports she has no signs or symptoms concerning for recurrent VTE such as leg pain, leg swelling, chest pain, or shortness of breath.  She has had no recent illnesses such as fevers, chills, sweats, nausea, vomiting or diarrhea.  A full 10 point ROS otherwise negative.  MEDICAL HISTORY:  Past Medical History:  Diagnosis Date   Abnormal Pap smear of cervix    Blood dyscrasia    factor 5   DVT (deep vein thrombosis) in pregnancy    Factor V Leiden    Headache(784.0)    Infection    UTI   PCOS (polycystic ovarian syndrome)    PONV (postoperative nausea and vomiting)    Pulmonary embolism (HCC) 06/08/2011   Varicose veins      SURGICAL HISTORY: Past Surgical History:  Procedure Laterality Date   BIOPSY  06/27/2022   Procedure: BIOPSY;  Surgeon: Rosalie Kitchens, MD;  Location: [facility];  Service: Gastroenterology;;   bladder stem stretched     CESAREAN SECTION N/A 07/01/2013   Procedure: CESAREAN SECTION;  Surgeon: Lynwood FORBES Curlene PONCE, MD;  Location: [facility];  Service: Obstetrics;  Laterality: N/A;  Primary edc 07/11/13   COLONOSCOPY WITH PROPOFOL  N/A 06/27/2022   Procedure: COLONOSCOPY WITH PROPOFOL ;  Surgeon: Rosalie Kitchens, MD;  Location: [facility];  Service: Gastroenterology;  Laterality: N/A;   ESOPHAGOGASTRODUODENOSCOPY (EGD) WITH PROPOFOL  N/A 06/27/2022   Procedure: ESOPHAGOGASTRODUODENOSCOPY (EGD) WITH PROPOFOL ;  Surgeon: Rosalie Kitchens, MD;  Location: [facility];  Service: Gastroenterology;  Laterality: N/A;    SOCIAL HISTORY: Social History   Socioeconomic History   Marital status: Married    Spouse name: Not on file   Number of children: Not on file   Years of education: Not on file   Highest education level: Not on file  Occupational History   Not on file  Tobacco Use   Smoking status: Never    Passive exposure: Never   Smokeless tobacco: Never  Substance and Sexual Activity   Alcohol use: No   Drug use: No   Sexual activity: Yes    Partners: Male    Birth control/protection: I.U.D.    Comment: menarche 38yo, sexual debut  as a teen  Other Topics Concern   Not on file  Social History Narrative   Not on file   Social Drivers of Health   Tobacco Use: Low Risk (06/15/2024)   Received from [facility]   Patient History    Passive Exposure: Not on file    Smokeless Tobacco Use: Never    Smoking Tobacco Use: Never  Financial Resource Strain: Not on file  Food Insecurity: Low Risk (05/07/2024)   Received from [facility]   Epic    Within the past 12 months, you worried that your food would run out before you got money to buy more: Never true    Within the past 12 months, the  food you bought just didn't last and you didn't have money to get more. : Never true  Transportation Needs: No Transportation Needs (05/07/2024)   Received from [facility]    In the past 12 months, has lack of reliable transportation kept you from medical appointments, meetings, work or from getting things needed for daily living? : No  Physical Activity: Not on file  Stress: Not on file  Social Connections: Not on file  Intimate Partner Violence: Not on file  Depression (PHQ2-9): Low Risk (07/29/2024)   Depression (PHQ2-9)    PHQ-2 Score: 0  Alcohol Screen: Not on file  Housing: Low Risk (05/07/2024)   Received from [facility]   Epic    What is your living situation today?: I have a steady place to live    Think about the place you live. Do you have problems with any of the following? Choose all that apply:: None/None on this list  Utilities: Low Risk (05/07/2024)   Received from [facility]   Utilities    In the past 12 months has the electric, gas, oil, or water company threatened to shut off services in your home? : No  Health Literacy: Not on file    FAMILY HISTORY: Family History  Problem Relation Age of Onset   Cancer Mother        skin   Hyperlipidemia Mother    Heart disease Father    Colon polyps Father    Miscarriages / Stillbirths Sister    Diabetes Maternal Grandmother    Cancer Maternal Grandmother        breast   Osteoporosis Maternal Grandmother    Glaucoma Maternal Grandmother    Vision loss Maternal Grandmother    Breast cancer Maternal Grandmother    Dementia Maternal Grandmother    Heart disease Maternal Grandfather    Hypertension Maternal Grandfather    Stroke Maternal Grandfather    Dementia Paternal Grandmother    Heart disease Paternal Grandfather    Colon cancer Maternal Uncle    Colon cancer Paternal Aunt     ALLERGIES:  is allergic to ciprofloxacin, neomycin-bacitracin zn-polymyx, bactrim  [sulfamethoxazole-trimethoprim], fluconazole , oxycodone , amoxicillin, miconazole nitrate, and penicillins.  MEDICATIONS:  Current Outpatient Medications  Medication Sig Dispense Refill   acetaminophen  (TYLENOL ) 500 MG tablet Take 1,000 mg by mouth every 6 (six) hours as needed for moderate pain.     ALPRAZolam (XANAX) 0.5 MG tablet Take by mouth. (Patient taking differently: Take 0.5 mg by mouth as needed.)     buPROPion  (WELLBUTRIN  XL) 300 MG 24 hr tablet Take 300 mg by mouth daily.     butalbital -acetaminophen -caffeine  (FIORICET ) 50-325-40 MG tablet Take 2 tablets by mouth every 6 (six) hours as needed for headache. 30 tablet 1  cholestyramine  light (PREVALITE ) 4 g packet Take 1 packet (4 g total) by mouth daily. 60 each 2   cyanocobalamin (VITAMIN B12) 1000 MCG/ML injection Inject 1,000 mcg into the muscle every 14 (fourteen) days.     enoxaparin  (LOVENOX ) 120 MG/0.8ML injection Inject 0.8 mLs (120 mg total) into the skin every 12 (twelve) hours. 48 mL 2   famotidine (PEPCID) 20 MG tablet Take 20 mg by mouth 2 (two) times daily.     fluticasone (FLONASE) 50 MCG/ACT nasal spray Place into both nostrils daily.     hydrocortisone  (ANUSOL -HC) 2.5 % rectal cream Place 1 Application rectally 2 (two) times daily. 30 g 0   hydrocortisone  (ANUSOL -HC) 25 MG suppository Place 1 suppository (25 mg total) rectally 2 (two) times daily. 12 suppository 0   nystatin  cream (MYCOSTATIN ) Apply topically.     nystatin -triamcinolone  ointment (MYCOLOG) Apply 1 Application topically 2 (two) times daily. 30 g 1   ondansetron  (ZOFRAN -ODT) 8 MG disintegrating tablet Take 1 tablet (8 mg total) by mouth every 8 (eight) hours as needed for nausea or vomiting. 30 tablet 0   pantoprazole (PROTONIX) 40 MG tablet Take 40 mg by mouth daily.     promethazine  (PHENERGAN ) 25 MG suppository Place 25 mg rectally every 6 (six) hours as needed for nausea or vomiting.     scopolamine  (TRANSDERM-SCOP) 1 MG/3DAYS Apply 1 patch  topically.     SUMAtriptan (IMITREX) 50 MG tablet Take 50 mg by mouth every 2 (two) hours as needed for migraine.     SYRINGE-NEEDLE, DISP, 3 ML 25G X 5/8 3 ML MISC 1,000 mcg by Misc.(Non-Drug; Combo Route) route every 30 days.     Vitamin D, Ergocalciferol, (DRISDOL) 1.25 MG (50000 UT) CAPS capsule Take 50,000 Units by mouth every 7 (seven) days.     Current Facility-Administered Medications  Medication Dose Route Frequency Provider Last Rate Last Admin   levonorgestrel  (MIRENA ) 20 MCG/DAY IUD 1 each  1 each Intrauterine Once Chrzanowski, Jami B, NP        REVIEW OF SYSTEMS:   Constitutional: ( - ) fevers, ( - )  chills , ( - ) night sweats Eyes: ( - ) blurriness of vision, ( - ) double vision, ( - ) watery eyes Ears, nose, mouth, throat, and face: ( - ) mucositis, ( - ) sore throat Respiratory: ( - ) cough, ( - ) dyspnea, ( - ) wheezes Cardiovascular: ( - ) palpitation, ( - ) chest discomfort, ( - ) lower extremity swelling Gastrointestinal:  ( + ) nausea, ( - ) heartburn, ( +) change in bowel habits Skin: ( - ) abnormal skin rashes Lymphatics: ( - ) new lymphadenopathy, ( - ) easy bruising Neurological: ( - ) numbness, ( - ) tingling, ( - ) new weaknesses Behavioral/Psych: ( - ) mood change, ( - ) new changes  All other systems were reviewed with the patient and are negative.  PHYSICAL EXAMINATION:  Vitals:   07/29/24 1149  BP: 106/74  Pulse: 68  Resp: 17  Temp: 98 F (36.7 C)  SpO2: 100%   Filed Weights   07/29/24 1149  Weight: 250 lb (113.4 kg)    GENERAL: Well-appearing middle-aged Caucasian female, alert, no distress and comfortable SKIN: skin color, texture, turgor are normal, no rashes or significant lesions EYES: conjunctiva are pink and non-injected, sclera clear LUNGS: clear to auscultation and percussion with normal breathing effort HEART: regular rate & rhythm and no murmurs. Bilateral but L > R lower extremity  edema with varicosities.  Musculoskeletal: no  cyanosis of digits and no clubbing  PSYCH: alert & oriented x 3, fluent speech NEURO: no focal motor/sensory deficits  LABORATORY DATA:  I have reviewed the data as listed    Latest Ref Rng & Units 07/29/2024   11:23 AM 07/15/2024   10:12 AM 06/20/2024   11:23 AM  CBC  WBC 4.0 - 10.5 K/uL 5.7  6.1  6.8   Hemoglobin 12.0 - 15.0 g/dL 86.8  85.9  86.3   Hematocrit 36.0 - 46.0 % 38.6  41.7  40.6   Platelets 150 - 400 K/uL 252  234  258        Latest Ref Rng & Units 07/29/2024   11:23 AM 07/15/2024   10:12 AM 06/20/2024   11:23 AM  CMP  Glucose 70 - 99 mg/dL 96  96  77   BUN 6 - 20 mg/dL 7  7  9    Creatinine 0.44 - 1.00 mg/dL 9.05  9.13  8.98   Sodium 135 - 145 mmol/L 140  140  139   Potassium 3.5 - 5.1 mmol/L 4.2  4.1  3.9   Chloride 98 - 111 mmol/L 102  103  104   CO2 22 - 32 mmol/L 29  28  29    Calcium 8.9 - 10.3 mg/dL 9.3  9.6  9.2   Total Protein 6.5 - 8.1 g/dL 6.9  7.3  6.9   Total Bilirubin 0.0 - 1.2 mg/dL 0.5  0.4  0.4   Alkaline Phos 38 - 126 U/L 107  105  101   AST 15 - 41 U/L 14  18  49   ALT 0 - 44 U/L 17  26  87     RADIOGRAPHIC STUDIES: No results found.   ASSESSMENT & PLAN Robin Key is a 38 y.o.  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  # Iron Deficiency Anemia 2/2 to GYN Bleeding -- Findings are consistent with iron deficiency anemia secondary to patient's menstrual cycles which are now under better control. --Patient currently has an IUD in place. --Labs today show no evidence of anemia with Hgb 13.6, MCV 86.9. Iron panel shows no deficiency with ferritin 86, saturation 25%.   -- Patient not currently taking any iron supplementation. No need for IV iron at this time.  --continue to monitor.  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  She was on full dose anticoagulation  with Lovenox  after C-section on 07/02/2023 -- Patient was started on Xarelto  therapy after she developed a superficial thrombophlebitis in January 2015 -- Xarelto  20 mg p.o. daily was started in June 2019 due to superficial thrombophlebitis.   Plan: -- Switched from Xarelto  to Lovenox  1 mg/kg after recent diagnosis of thrombosis on GSV. Felt to be secondary to difficulty with absorption of Xarelto  due to recurrent vomiting rather than Xarelto  failure. Advised to continue with Lovenox  therapy at this time and refill sent to pharmacy.  -- Labs today show white blood cell 5.7, hemoglobin 13.1, MCV 87.3, platelets 252.  LFTs and creatinine have normalized. --Referral to vascular surgery to address varicosities.  -- Will consider switching back to Xarelto  if GI symptoms improve. Follow up in 6 weeks to discuss.   #Recurrent vomiting/diarrhea: --Refilled patient's cholestyramine  prescription. Patient will follow up with  PCP to refill protonix and pepcid prescription.  --Referral sent to [facility] to transfer care and to further evaluate ongoing symptoms.  --Encouraged to stay hydrated drinking 1-2 L of water.   #Right sided abdominal pain: #Elevated liver enzymes: --Can be related to previous Xarelto  therapy but patient is also c/o ongoing right sided abdominal pain -- US  liver on 06/27/2024 showed diffusely increased hepatic parenchyma which was nonspecific.   No orders of the defined types were placed in this encounter.   All questions were answered. The patient knows to call the clinic with any problems, questions or concerns.   I have spent a total of 30 minutes minutes of face-to-face and non-face-to-face time, preparing to see the patient, performing a medically appropriate examination, counseling and educating the patient, ordering medications/tests/procedures, referring with other health care professionals, documenting clinical information in the electronic health record, independently  interpreting results and communicating results to the patient, and care coordination.   Norleen IVAR Kidney, MD Department of Hematology/Oncology [facility] at [facility] Phone: [phone] Pager: [phone] Email: [email or web address]   08/03/2024 3:39 PM  "

## 2024-08-03 ENCOUNTER — Encounter: Payer: Self-pay | Admitting: Hematology and Oncology

## 2024-08-07 ENCOUNTER — Encounter: Payer: Self-pay | Admitting: Hematology and Oncology

## 2024-08-10 ENCOUNTER — Encounter: Payer: Self-pay | Admitting: Hematology and Oncology

## 2024-08-11 ENCOUNTER — Telehealth: Payer: Self-pay | Admitting: Gastroenterology

## 2024-08-11 ENCOUNTER — Ambulatory Visit: Admitting: Gastroenterology

## 2024-08-11 ENCOUNTER — Encounter: Payer: Self-pay | Admitting: Gastroenterology

## 2024-08-11 ENCOUNTER — Other Ambulatory Visit (INDEPENDENT_AMBULATORY_CARE_PROVIDER_SITE_OTHER)

## 2024-08-11 VITALS — BP 102/72 | HR 72 | Ht 69.0 in | Wt 251.0 lb

## 2024-08-11 DIAGNOSIS — R197 Diarrhea, unspecified: Secondary | ICD-10-CM | POA: Diagnosis not present

## 2024-08-11 DIAGNOSIS — Z860100 Personal history of colon polyps, unspecified: Secondary | ICD-10-CM

## 2024-08-11 DIAGNOSIS — D509 Iron deficiency anemia, unspecified: Secondary | ICD-10-CM | POA: Diagnosis not present

## 2024-08-11 DIAGNOSIS — R112 Nausea with vomiting, unspecified: Secondary | ICD-10-CM

## 2024-08-11 DIAGNOSIS — R1030 Lower abdominal pain, unspecified: Secondary | ICD-10-CM

## 2024-08-11 DIAGNOSIS — R152 Fecal urgency: Secondary | ICD-10-CM | POA: Diagnosis not present

## 2024-08-11 DIAGNOSIS — K760 Fatty (change of) liver, not elsewhere classified: Secondary | ICD-10-CM | POA: Diagnosis not present

## 2024-08-11 LAB — IBC + FERRITIN
Ferritin: 53.6 ng/mL (ref 10.0–291.0)
Iron: 71 ug/dL (ref 42–145)
Saturation Ratios: 20.4 % (ref 20.0–50.0)
TIBC: 348.6 ug/dL (ref 250.0–450.0)
Transferrin: 249 mg/dL (ref 212.0–360.0)

## 2024-08-11 LAB — C-REACTIVE PROTEIN: CRP: 1.2 mg/dL (ref 1.0–20.0)

## 2024-08-11 NOTE — Progress Notes (Signed)
 "  Chief Complaint: recurrent nausea, vomiting, diarrhea Primary GI Doctor:Dr. 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    GI history:  01/28/2019 patient followed with digestive health in regards to GERD and diarrhea. That time was because she had a normal work-up revealing no evidence of fat-soluble vitamin deficiencies, negative stool testing and normal blood work. She continued to have difficulty with multiple episodes of diarrhea day. She cut back on Caffeine  and Lactulose but it did not help. Reflux was the same with postprandial coughing, pyrosis and thick postprandial mucus despite once daily Pantoprazole 40 daily. At time discussed diarrhea was likely functional versus cola reticulocyte. She was tried on Colestipol 1 mg titrated to effect for control of diarrhea. Discussed that if no relief with this medication would consider antispasmodics. Recommended an EGD.   06/08/2022 seen by our office. Had scheduled procedures with [facility]. Patient taking cholestyramine  for diarrhea. Also describes urgency and abdominal pain with almost constant nausea and vomiting quite often regardless of her Pantoprazole 40 mg daily and Famotidine 40 twice daily as well as Zofran  8 mg every 8 hours.   07/29/24 seen by Hematology Dr. Federico for follow-up.  Interval History Patient last seen in GI office on 06/08/22 by Delon, PA for nausea and vomiting, hemorrhoids, and diarrhea.  Patient was previously seen by [facility] 2023, reports they ordered workup but cannot recall everything that was done.  Patient did have EGD and colonoscopy with recommendations to follow-up for another colonoscopy in 2026.  Patient presents with several gastrointestinal  complaints. Patient with history of GERD and taking Pantoprazole 40 mg po daily and famotidine twice daily. Reports uncontrolled GERD with worsening nausea and vomiting. She reports going on for several months. She is taking ondansetron  prn which helps some.     Patient reports orange foamy stool, will have up to 20 BM's some days. She will have postprandial diarrhea with urgency.  Patient reports her stools have undigested food in them. She has intermittent lower abdominal cramping with the diarrhea. Symptoms worse with coffee and certain foods- red meats, fruits with peels, grapes  She is taking cholestyramine  twice daily which helps some.   Patient reports the severity of her symptoms affects her ability to go out in public as well as work effectively at her job.  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  No alcohol use. Nonsmoker.   She has continued on Lovenox  therapy as she was having issues with nausea and vomiting and was unable to keep her Xarelto  down.   Patient's family history includes: Aunt with colon CA, uncle with colon CA. Her daughter has  Ehlers-Danlos Syndrome.  Wt Readings from Last 3 Encounters:  08/11/24 251 lb (113.9 kg)  07/29/24 250 lb (113.4 kg)  06/20/24 256 lb (116.1 kg)     Past Medical History:  Diagnosis Date   Abnormal Pap smear of cervix    Blood dyscrasia    factor 5   DVT (deep vein thrombosis) in pregnancy    Factor V Leiden    Headache(784.0)    Infection    UTI  PCOS (polycystic ovarian syndrome)    PONV (postoperative nausea and vomiting)    Pulmonary embolism (HCC) 06/08/2011   Varicose veins     Past Surgical History:  Procedure Laterality Date   BIOPSY  06/27/2022   Procedure: BIOPSY;  Surgeon: Rosalie Kitchens, MD;  Location: [facility];  Service: Gastroenterology;;   bladder stem stretched     CESAREAN SECTION N/A 07/01/2013   Procedure: CESAREAN SECTION;   Surgeon: Lynwood FORBES Curlene PONCE, MD;  Location: [facility];  Service: Obstetrics;  Laterality: N/A;  Primary edc 07/11/13   COLONOSCOPY WITH PROPOFOL  N/A 06/27/2022   Procedure: COLONOSCOPY WITH PROPOFOL ;  Surgeon: Rosalie Kitchens, MD;  Location: [facility];  Service: Gastroenterology;  Laterality: N/A;   ESOPHAGOGASTRODUODENOSCOPY (EGD) WITH PROPOFOL  N/A 06/27/2022   Procedure: ESOPHAGOGASTRODUODENOSCOPY (EGD) WITH PROPOFOL ;  Surgeon: Rosalie Kitchens, MD;  Location: [facility];  Service: Gastroenterology;  Laterality: N/A;    Current Outpatient Medications  Medication Sig Dispense Refill   acetaminophen  (TYLENOL ) 500 MG tablet Take 1,000 mg by mouth every 6 (six) hours as needed for moderate pain.     ALPRAZolam (XANAX) 0.5 MG tablet Take by mouth. (Patient taking differently: Take 0.5 mg by mouth as needed.)     buPROPion  (WELLBUTRIN  XL) 300 MG 24 hr tablet Take 300 mg by mouth daily.     butalbital -acetaminophen -caffeine  (FIORICET ) 50-325-40 MG tablet Take 2 tablets by mouth every 6 (six) hours as needed for headache. 30 tablet 1   cholestyramine  light (PREVALITE ) 4 g packet Take 1 packet (4 g total) by mouth daily. 60 each 2   cyanocobalamin (VITAMIN B12) 1000 MCG/ML injection Inject 1,000 mcg into the muscle every 14 (fourteen) days.     enoxaparin  (LOVENOX ) 120 MG/0.8ML injection Inject 0.8 mLs (120 mg total) into the skin every 12 (twelve) hours. 48 mL 2   famotidine (PEPCID) 20 MG tablet Take 20 mg by mouth 2 (two) times daily.     fluticasone (FLONASE) 50 MCG/ACT nasal spray Place into both nostrils daily.     hydrocortisone  (ANUSOL -HC) 2.5 % rectal cream Place 1 Application rectally 2 (two) times daily. 30 g 0   hydrocortisone  (ANUSOL -HC) 25 MG suppository Place 1 suppository (25 mg total) rectally 2 (two) times daily. 12 suppository 0   nystatin  cream (MYCOSTATIN ) Apply topically.     nystatin -triamcinolone  ointment (MYCOLOG) Apply 1 Application topically 2 (two) times daily. 30 g 1    ondansetron  (ZOFRAN -ODT) 8 MG disintegrating tablet Take 1 tablet (8 mg total) by mouth every 8 (eight) hours as needed for nausea or vomiting. 30 tablet 0   pantoprazole (PROTONIX) 40 MG tablet Take 40 mg by mouth daily.     promethazine  (PHENERGAN ) 25 MG suppository Place 25 mg rectally every 6 (six) hours as needed for nausea or vomiting.     scopolamine  (TRANSDERM-SCOP) 1 MG/3DAYS Apply 1 patch topically.     SUMAtriptan (IMITREX) 50 MG tablet Take 50 mg by mouth every 2 (two) hours as needed for migraine.     SYRINGE-NEEDLE, DISP, 3 ML 25G X 5/8 3 ML MISC 1,000 mcg by Misc.(Non-Drug; Combo Route) route every 30 days.     Vitamin D, Ergocalciferol, (DRISDOL) 1.25 MG (50000 UT) CAPS capsule Take 50,000 Units by mouth every 7 (seven) days.     XARELTO  20 MG TABS tablet Take 20 mg by mouth daily.     Current Facility-Administered Medications  Medication Dose Route Frequency Provider Last Rate Last Admin   levonorgestrel  (MIRENA ) 20 MCG/DAY IUD 1 each  1 each Intrauterine Once Chrzanowski, Jami B, NP        Allergies as of 08/11/2024 - Review Complete 08/11/2024  Allergen Reaction Noted   Ciprofloxacin Other (See Comments) 11/27/2012   Neomycin-bacitracin zn-polymyx Rash 08/04/2017   Bactrim [sulfamethoxazole-trimethoprim] Other (See Comments) 11/27/2012   Fluconazole   08/16/2022   Oxycodone  Nausea And Vomiting 10/21/2015   Amoxicillin Rash 10/21/2015   Miconazole nitrate Rash 09/24/2019   Penicillins Rash 11/27/2012    Family History  Problem Relation Age of Onset   Cancer Mother        skin   Hyperlipidemia Mother    Heart disease Father    Colon polyps Father    Miscarriages / Stillbirths Sister    Diabetes Maternal Grandmother    Cancer Maternal Grandmother        breast   Osteoporosis Maternal Grandmother    Glaucoma Maternal Grandmother    Vision loss Maternal Grandmother    Breast cancer Maternal Grandmother    Dementia Maternal Grandmother    Heart disease Maternal  Grandfather    Hypertension Maternal Grandfather    Stroke Maternal Grandfather    Dementia Paternal Grandmother    Heart disease Paternal Grandfather    Colon cancer Maternal Uncle    Colon cancer Paternal Aunt     Review of Systems:    Constitutional: No weight loss, fever, chills, weakness or fatigue HEENT: Eyes: No change in vision               Ears, Nose, Throat:  No change in hearing or congestion Skin: No rash or itching Cardiovascular: No chest pain, chest pressure or palpitations   Respiratory: No SOB or cough Gastrointestinal: See HPI and otherwise negative Genitourinary: No dysuria or change in urinary frequency Neurological: No headache, dizziness or syncope Musculoskeletal: No new muscle or joint pain Hematologic: No bleeding or bruising Psychiatric: No history of depression or anxiety    Physical Exam:  Vital signs: BP 102/72   Pulse 72   Ht 5' 9 (1.753 m)   Wt 251 lb (113.9 kg)   LMP 07/02/2024   SpO2 100%   BMI 37.07 kg/m   Constitutional: Pleasant  female appears to be in NAD, Well developed, Well nourished, alert and cooperative Eyes:   PEERL, EOMI. No icterus. Conjunctiva pink. Neck:  Supple Throat: Oral cavity and pharynx without inflammation, swelling or lesion.  Respiratory: Respirations even and unlabored. Lungs clear to auscultation bilaterally.   No wheezes, crackles, or rhonchi.  Cardiovascular: Normal S1, S2. Regular rate and rhythm. No peripheral edema, cyanosis or pallor.  Gastrointestinal:  Soft, nondistended, generalized abdominal tenderness. No rebound or guarding. Normal bowel sounds. No appreciable masses or hepatomegaly. Rectal:  Not performed.  Msk:  Symmetrical without gross deformities. Without edema, no deformity or joint abnormality.  Neurologic:  Alert and  oriented x4;  grossly normal neurologically.  Skin:   Dry and intact without significant lesions or rashes.  RELEVANT LABS AND IMAGING: CBC    Latest Ref Rng & Units  07/29/2024   11:23 AM 07/15/2024   10:12 AM 06/20/2024   11:23 AM  CBC  WBC 4.0 - 10.5 K/uL 5.7  6.1  6.8   Hemoglobin 12.0 - 15.0 g/dL 86.8  85.9  86.3   Hematocrit 36.0 - 46.0 % 38.6  41.7  40.6   Platelets 150 - 400 K/uL 252  234  258      CMP     Latest Ref Rng & Units 07/29/2024  11:23 AM 07/15/2024   10:12 AM 06/20/2024   11:23 AM  CMP  Glucose 70 - 99 mg/dL 96  96  77   BUN 6 - 20 mg/dL 7  7  9    Creatinine 0.44 - 1.00 mg/dL 9.05  9.13  8.98   Sodium 135 - 145 mmol/L 140  140  139   Potassium 3.5 - 5.1 mmol/L 4.2  4.1  3.9   Chloride 98 - 111 mmol/L 102  103  104   CO2 22 - 32 mmol/L 29  28  29    Calcium 8.9 - 10.3 mg/dL 9.3  9.6  9.2   Total Protein 6.5 - 8.1 g/dL 6.9  7.3  6.9   Total Bilirubin 0.0 - 1.2 mg/dL 0.5  0.4  0.4   Alkaline Phos 38 - 126 U/L 107  105  101   AST 15 - 41 U/L 14  18  49   ALT 0 - 44 U/L 17  26  87      Lab Results  Component Value Date   TSH 1.24 04/03/2024   Lab Results  Component Value Date   IRON 74 06/20/2024   TIBC 293 06/20/2024   FERRITIN 86 06/20/2024  04/03/24 Hepatitis C antibody- non reactive    06/27/22 EGD with Dr. Rosalie - Normal larynx.  - Normal esophagus.  - Normal stomach. Biopsied.  - Normal duodenal bulb, first portion of the duodenum, second portion of the duodenum, third portion of the duodenum and fourth portion of the duodenum. Biopsied. - The examination was otherwise normal.  06/27/22 colonoscopy with Dr. Rosalie, recall 3 years - External and internal hemorrhoids. - The entire examined colon is normal. Biopsied.  - The examined portion of the ileum was normal. Biopsied.  - The examination was otherwise normal on direct and retroflexion views.  Path: FINAL MICROSCOPIC DIAGNOSIS:   A. DUODENUM, BIOPSY:  Benign duodenal mucosa with no diagnostic abnormality   B. STOMACH, BIOPSY:  Reactive gastropathy and minimal chronic gastritis with lymphoid aggregate  Negative for H. pylori, intestinal  metaplasia, dysplasia and carcinoma   C. TERMINAL ILEUM, BIOPSY:  Benign ileal mucosa with no diagnostic abnormality   D. COLON, RANDOM, BIOPSY:  Benign colonic mucosa with no diagnostic abnormality   E. COLON, HEPATIC FLEXURE, POLYPECTOMY:  Sessile serrated adenoma without cytologic dysplasia   Imaging: 04/2022 CTAP IMPRESSION: 1. Findings which [place] represent mild colitis involving the sigmoid colon. 2. 3.2 cm x 2.1 cm x 3.0 cm simple left ovarian cyst. No additional follow-up imaging is recommended. This recommendation follows ACR consensus guidelines: White Paper of the ACR Incidental Findings Committee II on Adnexal Findings. J Am Coll Radiol 2013:10:675-681. 3. Evidence of prior cholecystectomy. 4. Small, fat-containing umbilical hernia.  07/26/22 NM gastric emptying IMPRESSION: Normal gastric emptying study.  06/2024 US  ABD RUQ IMPRESSION: 1. No acute sonographic abnormality identified. 2. Mild diffusely increased hepatic parenchymal echogenicity, which is nonspecific but can be seen in the setting of hepatic steatosis or other hepatocellular disease processes. Recommend correlation with LFTs.    Assessment/Plan: Encounter Diagnoses  Name Primary?   Diarrhea, unspecified type Yes   Fecal urgency    Fatty liver    Nausea and vomiting, unspecified vomiting type    Lower abdominal pain    Iron deficiency anemia, unspecified iron deficiency anemia type    History of colonic polyps    39 year old female patient who presents with several gastrointestinal complaints.  Patient reports her symptoms have increased in severity  and frequency over the last several months.  Has chronic nausea and vomiting that has affected her ability to take oral medications including her blood thinner for Factor V Leiden.  Last endoscopy 11/23 with Dr. Rosalie.  Normal endoscopy with biopsies.  Gastric emptying study December 2023 normal.  Abdominal ultrasound 06/26/2024 with no acute abnormality.  patient currently on dual therapy for reflux with continuous symptoms.  Patient also with history of colon polyps (SSA), recall 3 years.  Patient reports worsening diarrhea with urgency and abdominal pain.  Will go ahead and proceed with stool studies as well as upper endoscopy and colonoscopy for full evaluation.  Incidental finding of fatty liver on ultrasound patient would like to pursue further workup given her history of lupus will go ahead and order lab work to rule out autoimmune hepatitis, genetic disorder, and/or hepatitis/ viral.  Most recent lab work from 2 weeks ago shows normal LFTs.  Platelets 252.  #1 Chronic Nausea/ Vomiting #2 uncontrolled GERD -continue ondansetron  prn  -recommend gastroparesis diet -Schedule EGD in hospital with Dr. San.The risks and benefits of EGD with possible biopsies and esophageal dilation were discussed with the patient who agrees to proceed. #3 History of Sessile serrated adenoma #4 Chronic Diarrhea - GI profile with cdiff   -Check CRP, TTG IgA, IgA -continue cholestyramine  4 gm twice daily to three times daily -Schedule for a colonoscopy in hospital with Dr. San. The risks and benefits of colonoscopy with possible polypectomy / biopsies were discussed and the patient agrees to proceed.  #5 lower abdominal pain  #6 Hepatic steatosis #7 Elevated LFT's- resolved (suspected to be related to previous Xarelto ?) US  liver on 06/27/2024 showed diffusely increased hepatic parenchyma which was nonspecific.  -ANA, AMA, Anti-smooth muscle antibody, Hepatitis A IgG, Hepatitis B surface antigen, Hepatitis B surface antibody,  HCV antibody, ferritin, TIBC,  Alpha 1 antitrypsin, ceruloplasmin, tTG, total IgA, IgG #8 Iron Deficiency Anemia 2/2 to GYN Bleeding  -managed by oncology/hematology -IV iron treatments prn  #9 VTE in Setting of Heterozygous Factor V Leiden  -on lovenox    Thank you for the courtesy of this consult. Please call me with any  questions or concerns.   Darletta Noblett, FNP-C [facility] 08/11/2024, 1:03 PM  Cc: Thayil, Irene T, PA-C  "

## 2024-08-11 NOTE — Telephone Encounter (Signed)
 Call from pt confirming date for colonoscopy. Pt asked to be scheduled on 08/26/2024. Pt requesting to communicate via MyChart. Please advise thank you.

## 2024-08-11 NOTE — H&P (View-Only) (Signed)
 "  Chief Complaint: recurrent nausea, vomiting, diarrhea Primary GI Doctor:Dr. 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    GI history:  01/28/2019 patient followed with digestive health in regards to GERD and diarrhea. That time was because she had a normal work-up revealing no evidence of fat-soluble vitamin deficiencies, negative stool testing and normal blood work. She continued to have difficulty with multiple episodes of diarrhea day. She cut back on Caffeine  and Lactulose but it did not help. Reflux was the same with postprandial coughing, pyrosis and thick postprandial mucus despite once daily Pantoprazole 40 daily. At time discussed diarrhea was likely functional versus cola reticulocyte. She was tried on Colestipol 1 mg titrated to effect for control of diarrhea. Discussed that if no relief with this medication would consider antispasmodics. Recommended an EGD.   06/08/2022 seen by our office. Had scheduled procedures with [facility]. Patient taking cholestyramine  for diarrhea. Also describes urgency and abdominal pain with almost constant nausea and vomiting quite often regardless of her Pantoprazole 40 mg daily and Famotidine 40 twice daily as well as Zofran  8 mg every 8 hours.   07/29/24 seen by Hematology Dr. Federico for follow-up.  Interval History Patient last seen in GI office on 06/08/22 by Delon, PA for nausea and vomiting, hemorrhoids, and diarrhea.  Patient was previously seen by [facility] 2023, reports they ordered workup but cannot recall everything that was done.  Patient did have EGD and colonoscopy with recommendations to follow-up for another colonoscopy in 2026.  Patient presents with several gastrointestinal  complaints. Patient with history of GERD and taking Pantoprazole 40 mg po daily and famotidine twice daily. Reports uncontrolled GERD with worsening nausea and vomiting. She reports going on for several months. She is taking ondansetron  prn which helps some.     Patient reports orange foamy stool, will have up to 20 BM's some days. She will have postprandial diarrhea with urgency.  Patient reports her stools have undigested food in them. She has intermittent lower abdominal cramping with the diarrhea. Symptoms worse with coffee and certain foods- red meats, fruits with peels, grapes  She is taking cholestyramine  twice daily which helps some.   Patient reports the severity of her symptoms affects her ability to go out in public as well as work effectively at her job.  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  No alcohol use. Nonsmoker.   She has continued on Lovenox  therapy as she was having issues with nausea and vomiting and was unable to keep her Xarelto  down.   Patient's family history includes: Aunt with colon CA, uncle with colon CA. Her daughter has  Ehlers-Danlos Syndrome.  Wt Readings from Last 3 Encounters:  08/11/24 251 lb (113.9 kg)  07/29/24 250 lb (113.4 kg)  06/20/24 256 lb (116.1 kg)     Past Medical History:  Diagnosis Date   Abnormal Pap smear of cervix    Blood dyscrasia    factor 5   DVT (deep vein thrombosis) in pregnancy    Factor V Leiden    Headache(784.0)    Infection    UTI  PCOS (polycystic ovarian syndrome)    PONV (postoperative nausea and vomiting)    Pulmonary embolism (HCC) 06/08/2011   Varicose veins     Past Surgical History:  Procedure Laterality Date   BIOPSY  06/27/2022   Procedure: BIOPSY;  Surgeon: Rosalie Kitchens, MD;  Location: [facility];  Service: Gastroenterology;;   bladder stem stretched     CESAREAN SECTION N/A 07/01/2013   Procedure: CESAREAN SECTION;   Surgeon: Lynwood FORBES Curlene PONCE, MD;  Location: [facility];  Service: Obstetrics;  Laterality: N/A;  Primary edc 07/11/13   COLONOSCOPY WITH PROPOFOL  N/A 06/27/2022   Procedure: COLONOSCOPY WITH PROPOFOL ;  Surgeon: Rosalie Kitchens, MD;  Location: [facility];  Service: Gastroenterology;  Laterality: N/A;   ESOPHAGOGASTRODUODENOSCOPY (EGD) WITH PROPOFOL  N/A 06/27/2022   Procedure: ESOPHAGOGASTRODUODENOSCOPY (EGD) WITH PROPOFOL ;  Surgeon: Rosalie Kitchens, MD;  Location: [facility];  Service: Gastroenterology;  Laterality: N/A;    Current Outpatient Medications  Medication Sig Dispense Refill   acetaminophen  (TYLENOL ) 500 MG tablet Take 1,000 mg by mouth every 6 (six) hours as needed for moderate pain.     ALPRAZolam (XANAX) 0.5 MG tablet Take by mouth. (Patient taking differently: Take 0.5 mg by mouth as needed.)     buPROPion  (WELLBUTRIN  XL) 300 MG 24 hr tablet Take 300 mg by mouth daily.     butalbital -acetaminophen -caffeine  (FIORICET ) 50-325-40 MG tablet Take 2 tablets by mouth every 6 (six) hours as needed for headache. 30 tablet 1   cholestyramine  light (PREVALITE ) 4 g packet Take 1 packet (4 g total) by mouth daily. 60 each 2   cyanocobalamin (VITAMIN B12) 1000 MCG/ML injection Inject 1,000 mcg into the muscle every 14 (fourteen) days.     enoxaparin  (LOVENOX ) 120 MG/0.8ML injection Inject 0.8 mLs (120 mg total) into the skin every 12 (twelve) hours. 48 mL 2   famotidine (PEPCID) 20 MG tablet Take 20 mg by mouth 2 (two) times daily.     fluticasone (FLONASE) 50 MCG/ACT nasal spray Place into both nostrils daily.     hydrocortisone  (ANUSOL -HC) 2.5 % rectal cream Place 1 Application rectally 2 (two) times daily. 30 g 0   hydrocortisone  (ANUSOL -HC) 25 MG suppository Place 1 suppository (25 mg total) rectally 2 (two) times daily. 12 suppository 0   nystatin  cream (MYCOSTATIN ) Apply topically.     nystatin -triamcinolone  ointment (MYCOLOG) Apply 1 Application topically 2 (two) times daily. 30 g 1    ondansetron  (ZOFRAN -ODT) 8 MG disintegrating tablet Take 1 tablet (8 mg total) by mouth every 8 (eight) hours as needed for nausea or vomiting. 30 tablet 0   pantoprazole (PROTONIX) 40 MG tablet Take 40 mg by mouth daily.     promethazine  (PHENERGAN ) 25 MG suppository Place 25 mg rectally every 6 (six) hours as needed for nausea or vomiting.     scopolamine  (TRANSDERM-SCOP) 1 MG/3DAYS Apply 1 patch topically.     SUMAtriptan (IMITREX) 50 MG tablet Take 50 mg by mouth every 2 (two) hours as needed for migraine.     SYRINGE-NEEDLE, DISP, 3 ML 25G X 5/8 3 ML MISC 1,000 mcg by Misc.(Non-Drug; Combo Route) route every 30 days.     Vitamin D, Ergocalciferol, (DRISDOL) 1.25 MG (50000 UT) CAPS capsule Take 50,000 Units by mouth every 7 (seven) days.     XARELTO  20 MG TABS tablet Take 20 mg by mouth daily.     Current Facility-Administered Medications  Medication Dose Route Frequency Provider Last Rate Last Admin   levonorgestrel  (MIRENA ) 20 MCG/DAY IUD 1 each  1 each Intrauterine Once Chrzanowski, Jami B, NP        Allergies as of 08/11/2024 - Review Complete 08/11/2024  Allergen Reaction Noted   Ciprofloxacin Other (See Comments) 11/27/2012   Neomycin-bacitracin zn-polymyx Rash 08/04/2017   Bactrim [sulfamethoxazole-trimethoprim] Other (See Comments) 11/27/2012   Fluconazole   08/16/2022   Oxycodone  Nausea And Vomiting 10/21/2015   Amoxicillin Rash 10/21/2015   Miconazole nitrate Rash 09/24/2019   Penicillins Rash 11/27/2012    Family History  Problem Relation Age of Onset   Cancer Mother        skin   Hyperlipidemia Mother    Heart disease Father    Colon polyps Father    Miscarriages / Stillbirths Sister    Diabetes Maternal Grandmother    Cancer Maternal Grandmother        breast   Osteoporosis Maternal Grandmother    Glaucoma Maternal Grandmother    Vision loss Maternal Grandmother    Breast cancer Maternal Grandmother    Dementia Maternal Grandmother    Heart disease Maternal  Grandfather    Hypertension Maternal Grandfather    Stroke Maternal Grandfather    Dementia Paternal Grandmother    Heart disease Paternal Grandfather    Colon cancer Maternal Uncle    Colon cancer Paternal Aunt     Review of Systems:    Constitutional: No weight loss, fever, chills, weakness or fatigue HEENT: Eyes: No change in vision               Ears, Nose, Throat:  No change in hearing or congestion Skin: No rash or itching Cardiovascular: No chest pain, chest pressure or palpitations   Respiratory: No SOB or cough Gastrointestinal: See HPI and otherwise negative Genitourinary: No dysuria or change in urinary frequency Neurological: No headache, dizziness or syncope Musculoskeletal: No new muscle or joint pain Hematologic: No bleeding or bruising Psychiatric: No history of depression or anxiety    Physical Exam:  Vital signs: BP 102/72   Pulse 72   Ht 5' 9 (1.753 m)   Wt 251 lb (113.9 kg)   LMP 07/02/2024   SpO2 100%   BMI 37.07 kg/m   Constitutional: Pleasant  female appears to be in NAD, Well developed, Well nourished, alert and cooperative Eyes:   PEERL, EOMI. No icterus. Conjunctiva pink. Neck:  Supple Throat: Oral cavity and pharynx without inflammation, swelling or lesion.  Respiratory: Respirations even and unlabored. Lungs clear to auscultation bilaterally.   No wheezes, crackles, or rhonchi.  Cardiovascular: Normal S1, S2. Regular rate and rhythm. No peripheral edema, cyanosis or pallor.  Gastrointestinal:  Soft, nondistended, generalized abdominal tenderness. No rebound or guarding. Normal bowel sounds. No appreciable masses or hepatomegaly. Rectal:  Not performed.  Msk:  Symmetrical without gross deformities. Without edema, no deformity or joint abnormality.  Neurologic:  Alert and  oriented x4;  grossly normal neurologically.  Skin:   Dry and intact without significant lesions or rashes.  RELEVANT LABS AND IMAGING: CBC    Latest Ref Rng & Units  07/29/2024   11:23 AM 07/15/2024   10:12 AM 06/20/2024   11:23 AM  CBC  WBC 4.0 - 10.5 K/uL 5.7  6.1  6.8   Hemoglobin 12.0 - 15.0 g/dL 86.8  85.9  86.3   Hematocrit 36.0 - 46.0 % 38.6  41.7  40.6   Platelets 150 - 400 K/uL 252  234  258      CMP     Latest Ref Rng & Units 07/29/2024  11:23 AM 07/15/2024   10:12 AM 06/20/2024   11:23 AM  CMP  Glucose 70 - 99 mg/dL 96  96  77   BUN 6 - 20 mg/dL 7  7  9    Creatinine 0.44 - 1.00 mg/dL 9.05  9.13  8.98   Sodium 135 - 145 mmol/L 140  140  139   Potassium 3.5 - 5.1 mmol/L 4.2  4.1  3.9   Chloride 98 - 111 mmol/L 102  103  104   CO2 22 - 32 mmol/L 29  28  29    Calcium 8.9 - 10.3 mg/dL 9.3  9.6  9.2   Total Protein 6.5 - 8.1 g/dL 6.9  7.3  6.9   Total Bilirubin 0.0 - 1.2 mg/dL 0.5  0.4  0.4   Alkaline Phos 38 - 126 U/L 107  105  101   AST 15 - 41 U/L 14  18  49   ALT 0 - 44 U/L 17  26  87      Lab Results  Component Value Date   TSH 1.24 04/03/2024   Lab Results  Component Value Date   IRON 74 06/20/2024   TIBC 293 06/20/2024   FERRITIN 86 06/20/2024  04/03/24 Hepatitis C antibody- non reactive    06/27/22 EGD with Dr. Rosalie - Normal larynx.  - Normal esophagus.  - Normal stomach. Biopsied.  - Normal duodenal bulb, first portion of the duodenum, second portion of the duodenum, third portion of the duodenum and fourth portion of the duodenum. Biopsied. - The examination was otherwise normal.  06/27/22 colonoscopy with Dr. Rosalie, recall 3 years - External and internal hemorrhoids. - The entire examined colon is normal. Biopsied.  - The examined portion of the ileum was normal. Biopsied.  - The examination was otherwise normal on direct and retroflexion views.  Path: FINAL MICROSCOPIC DIAGNOSIS:   A. DUODENUM, BIOPSY:  Benign duodenal mucosa with no diagnostic abnormality   B. STOMACH, BIOPSY:  Reactive gastropathy and minimal chronic gastritis with lymphoid aggregate  Negative for H. pylori, intestinal  metaplasia, dysplasia and carcinoma   C. TERMINAL ILEUM, BIOPSY:  Benign ileal mucosa with no diagnostic abnormality   D. COLON, RANDOM, BIOPSY:  Benign colonic mucosa with no diagnostic abnormality   E. COLON, HEPATIC FLEXURE, POLYPECTOMY:  Sessile serrated adenoma without cytologic dysplasia   Imaging: 04/2022 CTAP IMPRESSION: 1. Findings which [place] represent mild colitis involving the sigmoid colon. 2. 3.2 cm x 2.1 cm x 3.0 cm simple left ovarian cyst. No additional follow-up imaging is recommended. This recommendation follows ACR consensus guidelines: White Paper of the ACR Incidental Findings Committee II on Adnexal Findings. J Am Coll Radiol 2013:10:675-681. 3. Evidence of prior cholecystectomy. 4. Small, fat-containing umbilical hernia.  07/26/22 NM gastric emptying IMPRESSION: Normal gastric emptying study.  06/2024 US  ABD RUQ IMPRESSION: 1. No acute sonographic abnormality identified. 2. Mild diffusely increased hepatic parenchymal echogenicity, which is nonspecific but can be seen in the setting of hepatic steatosis or other hepatocellular disease processes. Recommend correlation with LFTs.    Assessment/Plan: Encounter Diagnoses  Name Primary?   Diarrhea, unspecified type Yes   Fecal urgency    Fatty liver    Nausea and vomiting, unspecified vomiting type    Lower abdominal pain    Iron deficiency anemia, unspecified iron deficiency anemia type    History of colonic polyps    39 year old female patient who presents with several gastrointestinal complaints.  Patient reports her symptoms have increased in severity  and frequency over the last several months.  Has chronic nausea and vomiting that has affected her ability to take oral medications including her blood thinner for Factor V Leiden.  Last endoscopy 11/23 with Dr. Rosalie.  Normal endoscopy with biopsies.  Gastric emptying study December 2023 normal.  Abdominal ultrasound 06/26/2024 with no acute abnormality.  patient currently on dual therapy for reflux with continuous symptoms.  Patient also with history of colon polyps (SSA), recall 3 years.  Patient reports worsening diarrhea with urgency and abdominal pain.  Will go ahead and proceed with stool studies as well as upper endoscopy and colonoscopy for full evaluation.  Incidental finding of fatty liver on ultrasound patient would like to pursue further workup given her history of lupus will go ahead and order lab work to rule out autoimmune hepatitis, genetic disorder, and/or hepatitis/ viral.  Most recent lab work from 2 weeks ago shows normal LFTs.  Platelets 252.  #1 Chronic Nausea/ Vomiting #2 uncontrolled GERD -continue ondansetron  prn  -recommend gastroparesis diet -Schedule EGD in hospital with Dr. San.The risks and benefits of EGD with possible biopsies and esophageal dilation were discussed with the patient who agrees to proceed. #3 History of Sessile serrated adenoma #4 Chronic Diarrhea - GI profile with cdiff   -Check CRP, TTG IgA, IgA -continue cholestyramine  4 gm twice daily to three times daily -Schedule for a colonoscopy in hospital with Dr. San. The risks and benefits of colonoscopy with possible polypectomy / biopsies were discussed and the patient agrees to proceed.  #5 lower abdominal pain  #6 Hepatic steatosis #7 Elevated LFT's- resolved (suspected to be related to previous Xarelto ?) US  liver on 06/27/2024 showed diffusely increased hepatic parenchyma which was nonspecific.  -ANA, AMA, Anti-smooth muscle antibody, Hepatitis A IgG, Hepatitis B surface antigen, Hepatitis B surface antibody,  HCV antibody, ferritin, TIBC,  Alpha 1 antitrypsin, ceruloplasmin, tTG, total IgA, IgG #8 Iron Deficiency Anemia 2/2 to GYN Bleeding  -managed by oncology/hematology -IV iron treatments prn  #9 VTE in Setting of Heterozygous Factor V Leiden  -on lovenox    Thank you for the courtesy of this consult. Please call me with any  questions or concerns.   Darletta Noblett, FNP-C [facility] 08/11/2024, 1:03 PM  Cc: Thayil, Irene T, PA-C  "

## 2024-08-11 NOTE — Patient Instructions (Addendum)
 Fatty liver Recommend Mediterranean diet Activity as tolerated  Maintain heathy weight Avoid alcohol use  Diarrhea Continue cholestyramine  twice daily, can titrate to three times daily Ordered stool samples Can try adding psyllium husk 1 tsp po daily to see if this helps Avoid trigger foods  Nausea and vomiting Continue ondansetron  prn Recommend gastroparesis diet  [email or web address]  Hospital Dates for procedures: 08/26/24, 09/25/24, 10/06/24, 10/20/24  Your provider has requested that you go to the basement level for lab work before leaving today. Press B on the elevator. The lab is located at the first door on the left as you exit the elevator.  _______________________________________________________  If your blood pressure at your visit was 140/90 or greater, please contact your primary care physician to follow up on this.  _______________________________________________________  If you are age 20 or older, your body mass index should be between 23-30. Your Body mass index is 37.07 kg/m. If this is out of the aforementioned range listed, please consider follow up with your Primary Care Provider.  If you are age 38 or younger, your body mass index should be between 19-25. Your Body mass index is 37.07 kg/m. If this is out of the aformentioned range listed, please consider follow up with your Primary Care Provider.   ________________________________________________________  The [facility] providers would like to encourage you to use MYCHART to communicate with providers for non-urgent requests or questions.  Due to long hold times on the telephone, sending your provider a message by [facility] may be a faster and more efficient way to get a response.  Please allow 48 business hours for a response.  Please remember that this is for non-urgent requests.   _______________________________________________________  [facility] is using a team-based approach to care.  Your team is made up of your doctor and two to three APPS. Our APPS (Nurse Practitioners and Physician Assistants) work with your physician to ensure care continuity for you. They are fully qualified to address your health concerns and develop a treatment plan. They communicate directly with your gastroenterologist to care for you. Seeing the Advanced Practice Practitioners on your physician's team can help you by facilitating care more promptly, often allowing for earlier appointments, access to diagnostic testing, procedures, and other specialty referrals.   Thank you for trusting me with your gastrointestinal care. Deanna May, FNP-C

## 2024-08-12 ENCOUNTER — Ambulatory Visit: Payer: Self-pay | Admitting: Gastroenterology

## 2024-08-12 LAB — IGG: IgG (Immunoglobin G), Serum: 850 mg/dL (ref 600–1640)

## 2024-08-12 LAB — HEPATITIS A ANTIBODY, TOTAL: Hepatitis A AB,Total: REACTIVE — AB

## 2024-08-12 LAB — HEPATITIS B SURFACE ANTIBODY,QUALITATIVE: Hep B S Ab: REACTIVE — AB

## 2024-08-12 LAB — HEPATITIS C ANTIBODY: Hepatitis C Ab: NONREACTIVE

## 2024-08-12 LAB — IGA: Immunoglobulin A: 190 mg/dL (ref 47–310)

## 2024-08-12 LAB — HEPATITIS B SURFACE ANTIGEN: Hepatitis B Surface Ag: NONREACTIVE

## 2024-08-12 LAB — ALPHA-1-ANTITRYPSIN: A-1 Antitrypsin, Ser: 149 mg/dL (ref 83–199)

## 2024-08-13 ENCOUNTER — Other Ambulatory Visit: Payer: Self-pay

## 2024-08-13 DIAGNOSIS — Z860100 Personal history of colon polyps, unspecified: Secondary | ICD-10-CM

## 2024-08-13 DIAGNOSIS — R1030 Lower abdominal pain, unspecified: Secondary | ICD-10-CM

## 2024-08-13 DIAGNOSIS — K219 Gastro-esophageal reflux disease without esophagitis: Secondary | ICD-10-CM

## 2024-08-13 DIAGNOSIS — R112 Nausea with vomiting, unspecified: Secondary | ICD-10-CM

## 2024-08-13 LAB — MITOCHONDRIAL ANTIBODIES: Mitochondrial M2 Ab, IgG: <=20 U

## 2024-08-13 LAB — ANA: Anti Nuclear Antibody (ANA): POSITIVE — AB

## 2024-08-13 LAB — TISSUE TRANSGLUTAMINASE ABS,IGG,IGA
(tTG) Ab, IgA: <1 U/mL
(tTG) Ab, IgG: <1 U/mL

## 2024-08-13 LAB — CERULOPLASMIN: Ceruloplasmin: 35 mg/dL (ref 14–48)

## 2024-08-13 LAB — ANTI-NUCLEAR AB-TITER (ANA TITER): ANA Titer 1: 1:160 {titer} — ABNORMAL HIGH

## 2024-08-13 LAB — ANTI-SMOOTH MUSCLE ANTIBODY, IGG: Actin (Smooth Muscle) Antibody (IGG): <20 U

## 2024-08-13 MED ORDER — NA SULFATE-K SULFATE-MG SULF 17.5-3.13-1.6 GM/177ML PO SOLN: 1.0000 | Freq: Once | ORAL | 0 refills | Status: AC

## 2024-08-14 ENCOUNTER — Encounter: Payer: Self-pay | Admitting: Gastroenterology

## 2024-08-14 ENCOUNTER — Telehealth: Payer: Self-pay

## 2024-08-14 MED ORDER — NA SULFATE-K SULFATE-MG SULF 17.5-3.13-1.6 GM/177ML PO SOLN: 1.0000 | Freq: Once | ORAL | 0 refills | Status: AC

## 2024-08-14 NOTE — Telephone Encounter (Signed)
" °  Robin Key [birth year] [id number]  08/14/2024    Dear Dr.Dorsey:  We have scheduled the above named patient for a(n) endoscopic procedure. Our records show that (s)he is on anticoagulation therapy.  Please advise as to how the patient may use their therapy of Levonox during their procedure which is scheduled for 08/26/24.  Please route your response to Karna Louder, RMA or fax response to [phone].  Sincerely,    [facility]   "

## 2024-08-14 NOTE — Telephone Encounter (Signed)
 See telephone encounter.

## 2024-08-18 ENCOUNTER — Encounter (HOSPITAL_COMMUNITY): Payer: Self-pay | Admitting: Gastroenterology

## 2024-08-18 NOTE — Progress Notes (Signed)
 Attempted to obtain medical history for pre op call via telephone, unable to reach at this time. HIPAA compliant voicemail message left requesting return call to pre surgical testing department.

## 2024-08-19 ENCOUNTER — Telehealth: Payer: Self-pay

## 2024-08-19 NOTE — Telephone Encounter (Signed)
 Procedure:EGD Procedure date: 08/26/24 Procedure location: [facility] Arrival Time: 9:13 Spoke with the patient Y/N: N Any prep concerns? N Has the patient obtained the prep from the pharmacy ? N Do you have a care partner and transportation: N Any additional concerns? N   I called the patient and was unsuccessful. I left a detailed message about the procedure and the office number in case the patient has questions are concerns.

## 2024-08-20 NOTE — Telephone Encounter (Signed)
 Pt confirmed appt via mychart.

## 2024-08-20 NOTE — Telephone Encounter (Signed)
 Unable to leave message, VM box full. Mychart message sent to pt.

## 2024-08-21 NOTE — Progress Notes (Signed)
 Agree with the assessment and plan as outlined by [facility], FNP-C.  Carlitos Bottino, DO, [facility]

## 2024-08-26 ENCOUNTER — Encounter (HOSPITAL_COMMUNITY)
Admission: RE | Disposition: A | Discharge status: Home / Self Care | Payer: Self-pay | Source: Ambulatory Visit | Attending: Gastroenterology

## 2024-08-26 ENCOUNTER — Ambulatory Visit (HOSPITAL_COMMUNITY): Admitting: Certified Registered"

## 2024-08-26 ENCOUNTER — Other Ambulatory Visit: Payer: Self-pay

## 2024-08-26 ENCOUNTER — Encounter (HOSPITAL_COMMUNITY): Payer: Self-pay | Admitting: Gastroenterology

## 2024-08-26 ENCOUNTER — Ambulatory Visit (HOSPITAL_COMMUNITY)
Admission: RE | Admit: 2024-08-26 | Discharge: 2024-08-26 | Disposition: A | Discharge status: Home / Self Care | Source: Ambulatory Visit | Attending: Gastroenterology | Admitting: Gastroenterology

## 2024-08-26 DIAGNOSIS — R197 Diarrhea, unspecified: Secondary | ICD-10-CM | POA: Diagnosis not present

## 2024-08-26 DIAGNOSIS — K648 Other hemorrhoids: Secondary | ICD-10-CM | POA: Diagnosis not present

## 2024-08-26 DIAGNOSIS — Z860100 Personal history of colon polyps, unspecified: Secondary | ICD-10-CM

## 2024-08-26 DIAGNOSIS — K219 Gastro-esophageal reflux disease without esophagitis: Secondary | ICD-10-CM

## 2024-08-26 DIAGNOSIS — R112 Nausea with vomiting, unspecified: Secondary | ICD-10-CM

## 2024-08-26 DIAGNOSIS — R1030 Lower abdominal pain, unspecified: Secondary | ICD-10-CM

## 2024-08-26 DIAGNOSIS — Z1211 Encounter for screening for malignant neoplasm of colon: Secondary | ICD-10-CM | POA: Diagnosis not present

## 2024-08-26 HISTORY — PX: COLONOSCOPY: SHX5424

## 2024-08-26 HISTORY — PX: ESOPHAGOGASTRODUODENOSCOPY: SHX5428

## 2024-08-26 HISTORY — PX: BIOPSY OF SKIN SUBCUTANEOUS TISSUE AND/OR MUCOUS MEMBRANE: SHX6741

## 2024-08-26 LAB — PREGNANCY, URINE: Preg Test, Ur: NEGATIVE

## 2024-08-26 MED ORDER — LIDOCAINE 2% (20 MG/ML) 5 ML SYRINGE
INTRAMUSCULAR | Status: DC | PRN
  Administered 2024-08-26: 100 mg via INTRAVENOUS

## 2024-08-26 MED ORDER — SODIUM CHLORIDE 0.9 % IV SOLN: INTRAVENOUS | Status: DC

## 2024-08-26 MED ORDER — PHENYLEPHRINE 80 MCG/ML (10ML) SYRINGE FOR IV PUSH (FOR BLOOD PRESSURE SUPPORT)
PREFILLED_SYRINGE | INTRAVENOUS | Status: DC | PRN
  Administered 2024-08-26: 160 ug via INTRAVENOUS

## 2024-08-26 MED ORDER — SODIUM CHLORIDE 0.9 % IV SOLN
INTRAVENOUS | Status: AC | PRN
  Administered 2024-08-26: 500 mL via INTRAMUSCULAR

## 2024-08-26 MED ORDER — PROPOFOL 500 MG/50ML IV EMUL
INTRAVENOUS | Status: DC | PRN
  Administered 2024-08-26: 175 ug/kg/min via INTRAVENOUS
  Administered 2024-08-26: 125 ug/kg/min via INTRAVENOUS

## 2024-08-26 MED ORDER — PROPOFOL 10 MG/ML IV BOLUS
INTRAVENOUS | Status: DC | PRN
  Administered 2024-08-26: 300 mg via INTRAVENOUS

## 2024-08-26 NOTE — Interval H&P Note (Signed)
 History and Physical Interval Note:  Recent negative/normal TTG IgA, total IgA, CRP, iron panel, CBC, CMP, LDH, amylase, lipase, A1c, TSH.  Extended serologic workup for liver disease otherwise negative/normal as well  ANA positive at 1:160, c/w known history of lupus.  No new imaging since last office appointment.  08/26/2024 9:49 AM  Robin Key  has presented today for surgery, with the diagnosis of GERD K21.9, History of Colonic Polyps Z86.0100.  The various methods of treatment have been discussed with the patient and family. After consideration of risks, benefits and other options for treatment, the patient has consented to  Procedures: EGD (ESOPHAGOGASTRODUODENOSCOPY) (N/A) COLONOSCOPY (N/A) as a surgical intervention.  The patient's history has been reviewed, patient examined, no change in status, stable for surgery.  I have reviewed the patient's chart and labs.  Questions were answered to the patient's satisfaction.     Sandor GAILS Denay Pleitez

## 2024-08-26 NOTE — Op Note (Signed)
 [facility] Patient Name: Robin Key Procedure Date: 08/26/2024 MRN: [id number] Attending MD: Sandor Flatter , MD, [id number] Date of Birth: May 13, 1986 CSN: [id number] Age: 39 Admit Type: Outpatient Procedure:                Colonoscopy Indications:              High risk colon cancer surveillance: Personal                            history of colonic polyps                           Incidental - Change in bowel habits, Diarrhea,                            Fecal urgency Providers:                Sandor Flatter, MD, Heather Ng, RN, [facility]                            Pettiford, Technician, Penne Gleason, CRNA Referring MD:              Medicines:                Monitored Anesthesia Care Complications:            No immediate complications. Estimated Blood Loss:     Estimated blood loss was minimal. Procedure:                Pre-Anesthesia Assessment:                           - Prior to the procedure, a History and Physical                            was performed, and patient medications and                            allergies were reviewed. The patient's tolerance of                            previous anesthesia was also reviewed. The risks                            and benefits of the procedure and the sedation                            options and risks were discussed with the patient.                            All questions were answered, and informed consent                            was obtained. Prior Anticoagulants: The patient has                            taken Lovenox  (enoxaparin ), last  dose was 3 days                            prior to procedure. ASA Grade Assessment: III - A                            patient with severe systemic disease. After                            reviewing the risks and benefits, the patient was                            deemed in satisfactory condition to undergo the                            procedure.                            After obtaining informed consent, the colonoscope                            was passed under direct vision. Throughout the                            procedure, the patient's blood pressure, pulse, and                            oxygen saturations were monitored continuously. The                            CF-HQ190L ([id number] Olympus colonoscope was                            introduced through the anus and advanced to the 10                            cm into the ileum. The colonoscopy was performed                            without difficulty. The patient tolerated the                            procedure well. The quality of the bowel                            preparation was good. The terminal ileum, ileocecal                            valve, appendiceal orifice, and rectum were                            photographed. Scope In: 10:29:14 AM Scope Out: 10:41:52 AM Scope Withdrawal Time: 0 hours 10 minutes 51 seconds  Total Procedure Duration: 0 hours 12 minutes 38 seconds  Findings:  The perianal and digital rectal examinations were normal.      The entire colon appeared normal. There were no areas of mucosal       erythema, edema, erosions, or ulceration noted on this study. Biopsies       for histology were taken with a cold forceps from the right colon and       left colon for evaluation of microscopic colitis. Estimated blood loss       was minimal.      Non-bleeding internal hemorrhoids were found during retroflexion. The       hemorrhoids were small.      The terminal ileum appeared normal. Impression:               - The entire examined colon is normal. Biopsied.                           - Non-bleeding internal hemorrhoids.                           - The examined portion of the ileum was normal. Moderate Sedation:      Not Applicable - Patient had care per Anesthesia. Recommendation:           - Patient has a contact number available for                             emergencies. The signs and symptoms of potential                            delayed complications were discussed with the                            patient. Return to normal activities tomorrow.                            Written discharge instructions were provided to the                            patient.                           - Resume previous diet.                           - Continue present medications.                           - Await pathology results.                           - Repeat colonoscopy in 5 years for surveillance                            due to history of sessile serrated adenoma on the                            prior colonoscopy.                           -  Return to GI office PRN.                           - Resume Lovenox  (enoxaparin ) at prior dose                            tomorrow. Procedure Code(s):        --- Professional ---                           [phone], Colonoscopy, flexible; with biopsy, single                            or multiple Diagnosis Code(s):        --- Professional ---                           Z86.010, Personal history of colonic polyps                           K64.8, Other hemorrhoids CPT copyright 2022 [employer]. All rights reserved. The codes documented in this report are preliminary and upon coder review may  be revised to meet current compliance requirements. Sandor Flatter, MD 08/26/2024 11:02:13 AM Number of Addenda: 0

## 2024-08-26 NOTE — Discharge Instructions (Signed)
YOU HAD AN ENDOSCOPIC PROCEDURE TODAY: Refer to the procedure report and other information in the discharge instructions given to you for any specific questions about what was found during the examination. If this information does not answer your questions, please call [place] office at [phone] to clarify.   YOU SHOULD EXPECT: Some feelings of bloating in the abdomen. Passage of more gas than usual. Walking can help get rid of the air that was put into your GI tract during the procedure and reduce the bloating. If you had a lower endoscopy (such as a colonoscopy or flexible sigmoidoscopy) you may notice spotting of blood in your stool or on the toilet paper. Some abdominal soreness may be present for a day or two, also.  DIET: Your first meal following the procedure should be a light meal and then it is ok to progress to your normal diet. A half-sandwich or bowl of soup is an example of a good first meal. Heavy or fried foods are harder to digest and may make you feel nauseous or bloated. Drink plenty of fluids but you should avoid alcoholic beverages for 24 hours.   ACTIVITY: Your care partner should take you home directly after the procedure. You should plan to take it easy, moving slowly for the rest of the day. You can resume normal activity the day after the procedure however YOU SHOULD NOT DRIVE, use power tools, machinery or perform tasks that involve climbing or major physical exertion for 24 hours (because of the sedation medicines used during the test).   SYMPTOMS TO REPORT IMMEDIATELY: A gastroenterologist can be reached at any hour. Please call [phone]  for any of the following symptoms:  Following lower endoscopy (colonoscopy, flexible sigmoidoscopy) Excessive amounts of blood in the stool  Significant tenderness, worsening of abdominal pains  Swelling of the abdomen that is new, acute  Fever of 100 or higher  Following upper endoscopy (EGD, EUS, ERCP, esophageal  dilation) Vomiting of blood or coffee ground material  New, significant abdominal pain  New, significant chest pain or pain under the shoulder blades  Painful or persistently difficult swallowing  New shortness of breath  Black, tarry-looking or red, bloody stools  FOLLOW UP:  If any biopsies were taken you will be contacted by phone or by letter within the next 1-3 weeks. Call [phone]  if you have not heard about the biopsies in 3 weeks.  Please also call with any specific questions about appointments or follow up tests.

## 2024-08-26 NOTE — Op Note (Signed)
 [facility] Patient Name: Robin Key Procedure Date: 08/26/2024 MRN: [id number] Attending MD: Sandor Flatter , MD, [id number] Date of Birth: [birth year] CSN: [id number] Age: 39 Admit Type: Outpatient Procedure:                Upper GI endoscopy Indications:              Suspected esophageal reflux, Diarrhea, Nausea with                            vomiting Providers:                Sandor Flatter, MD, Heather Ng, RN, [facility]                            Pettiford, Technician, Penne Gleason, CRNA Referring MD:              Medicines:                Monitored Anesthesia Care Complications:            No immediate complications. Estimated Blood Loss:     Estimated blood loss was minimal. Procedure:                Pre-Anesthesia Assessment:                           - Prior to the procedure, a History and Physical                            was performed, and patient medications and                            allergies were reviewed. The patient's tolerance of                            previous anesthesia was also reviewed. The risks                            and benefits of the procedure and the sedation                            options and risks were discussed with the patient.                            All questions were answered, and informed consent                            was obtained. Prior Anticoagulants: The patient has                            taken Lovenox  (enoxaparin ), last dose was 3 days                            prior to procedure. ASA Grade Assessment: III - A  patient with severe systemic disease. After                            reviewing the risks and benefits, the patient was                            deemed in satisfactory condition to undergo the                            procedure.                           After obtaining informed consent, the endoscope was                            passed under direct  vision. Throughout the                            procedure, the patient's blood pressure, pulse, and                            oxygen saturations were monitored continuously. The                            [id number]-[id number] ([id number] Olympus endoscope was introduced                            through the mouth, and advanced to the second part                            of duodenum. The upper GI endoscopy was                            accomplished without difficulty. The patient                            tolerated the procedure well. Scope In: Scope Out: Findings:      The examined esophagus was normal.      The Z-line was regular and was found 37 cm from the incisors.      The entire examined stomach was normal. Biopsies were taken with a cold       forceps for Helicobacter pylori testing. Estimated blood loss was       minimal.      The examined duodenum was normal. Biopsies were taken with a cold       forceps for histology. Estimated blood loss was minimal. Impression:               - Normal esophagus.                           - Z-line regular, 37 cm from the incisors.                           - Normal stomach. Biopsied.                           -  Normal examined duodenum. Biopsied. Moderate Sedation:      Not Applicable - Patient had care per Anesthesia. Recommendation:           - Patient has a contact number available for                            emergencies. The signs and symptoms of potential                            delayed complications were discussed with the                            patient. Return to normal activities tomorrow.                            Written discharge instructions were provided to the                            patient.                           - Resume previous diet.                           - Continue present medications.                           - Await pathology results.                           - Perform a colonoscopy today. Procedure  Code(s):        --- Professional ---                           [phone], Esophagogastroduodenoscopy, flexible,                            transoral; with biopsy, single or multiple Diagnosis Code(s):        --- Professional ---                           R19.7, Diarrhea, unspecified                           R11.2, Nausea with vomiting, unspecified CPT copyright 2022 American Medical Association. All rights reserved. The codes documented in this report are preliminary and upon coder review may  be revised to meet current compliance requirements. Sandor Flatter, MD 08/26/2024 10:57:59 AM Number of Addenda: 0

## 2024-08-26 NOTE — Anesthesia Postprocedure Evaluation (Signed)
"   Anesthesia Post Note  Patient: Robin Key  Procedure(s) Performed: EGD (ESOPHAGOGASTRODUODENOSCOPY) COLONOSCOPY     Patient location during evaluation: PACU Anesthesia Type: MAC Level of consciousness: awake and alert Pain management: pain level controlled Vital Signs Assessment: post-procedure vital signs reviewed and stable Respiratory status: spontaneous breathing, nonlabored ventilation, respiratory function stable and patient connected to nasal cannula oxygen Cardiovascular status: stable and blood pressure returned to baseline Postop Assessment: no apparent nausea or vomiting Anesthetic complications: no   No notable events documented.  Last Vitals:  Vitals:   08/26/24 1100 08/26/24 1110  BP: 113/62 116/65  Pulse: 72 64  Resp: 19 14  Temp:    SpO2: 100% 100%    Last Pain:  Vitals:   08/26/24 1110  TempSrc:   PainSc: 0-No pain                 Robin Key      "

## 2024-08-26 NOTE — Anesthesia Preprocedure Evaluation (Signed)
"                                    Anesthesia Evaluation  Patient identified by MRN, date of birth, ID band Patient awake    Reviewed: Allergy & Precautions, NPO status , Patient's Chart, lab work & pertinent test results, reviewed documented beta blocker date and time   History of Anesthesia Complications (+) PONV and history of anesthetic complications  Airway Mallampati: II  TM Distance: >3 FB     Dental no notable dental hx.    Pulmonary neg COPD   breath sounds clear to auscultation       Cardiovascular (-) CAD, (-) Past MI and (-) Cardiac Stents  Rhythm:Regular Rate:Normal     Neuro/Psych  Headaches, neg Seizures    GI/Hepatic ,neg GERD  ,,(+) neg Cirrhosis        Endo/Other    Renal/GU Renal disease     Musculoskeletal   Abdominal  (+) + obese  Peds  Hematology  (+) Blood dyscrasia, anemia   Anesthesia Other Findings   Reproductive/Obstetrics                              Anesthesia Physical Anesthesia Plan  ASA: 2  Anesthesia Plan: MAC   Post-op Pain Management:    Induction: Intravenous  PONV Risk Score and Plan: 3 and Ondansetron  and Propofol  infusion  Airway Management Planned: Natural Airway and Nasal Cannula  Additional Equipment:   Intra-op Plan:   Post-operative Plan:   Informed Consent: I have reviewed the patients History and Physical, chart, labs and discussed the procedure including the risks, benefits and alternatives for the proposed anesthesia with the patient or authorized representative who has indicated his/her understanding and acceptance.     Dental advisory given  Plan Discussed with: CRNA  Anesthesia Plan Comments:          Anesthesia Quick Evaluation  "

## 2024-08-26 NOTE — Transfer of Care (Signed)
 Immediate Anesthesia Transfer of Care Note  Patient: Robin Key  Procedure(s) Performed: EGD (ESOPHAGOGASTRODUODENOSCOPY) COLONOSCOPY  Patient Location: Endoscopy Unit  Anesthesia Type:MAC  Level of Consciousness: drowsy and patient cooperative  Airway & Oxygen Therapy: Patient Spontanous Breathing  Post-op Assessment: Report given to RN and Post -op Vital signs reviewed and stable  Post vital signs: Reviewed and stable  Last Vitals:  Vitals Value Taken Time  BP    Temp    Pulse 79 08/26/24 10:49  Resp 17 08/26/24 10:49  SpO2 99 % 08/26/24 10:49  Vitals shown include unfiled device data.  Last Pain:  Vitals:   08/26/24 0944  TempSrc: Temporal  PainSc: 0-No pain         Complications: No notable events documented.

## 2024-08-27 LAB — SURGICAL PATHOLOGY

## 2024-08-28 ENCOUNTER — Encounter (HOSPITAL_COMMUNITY): Payer: Self-pay | Admitting: Gastroenterology

## 2024-09-02 ENCOUNTER — Ambulatory Visit: Payer: Self-pay | Admitting: Gastroenterology

## 2024-09-09 ENCOUNTER — Encounter (INDEPENDENT_AMBULATORY_CARE_PROVIDER_SITE_OTHER): Payer: Self-pay | Admitting: Vascular Surgery

## 2024-09-22 ENCOUNTER — Encounter (INDEPENDENT_AMBULATORY_CARE_PROVIDER_SITE_OTHER): Payer: Self-pay | Admitting: Nurse Practitioner

## 2024-09-25 ENCOUNTER — Inpatient Hospital Stay: Payer: Self-pay | Attending: Hematology and Oncology

## 2024-09-25 ENCOUNTER — Inpatient Hospital Stay: Payer: Self-pay | Admitting: Hematology and Oncology
